# Patient Record
Sex: Female | Born: 1935 | Race: White | Hispanic: No | Marital: Married | Smoking: Never smoker
Health system: Southern US, Community
[De-identification: ages and names within clinical notes are randomized; demographics above are authoritative.]

## PROBLEM LIST (undated history)

## (undated) DIAGNOSIS — J189 Pneumonia, unspecified organism: Secondary | ICD-10-CM

## (undated) DIAGNOSIS — I639 Cerebral infarction, unspecified: Secondary | ICD-10-CM

## (undated) DIAGNOSIS — I4891 Unspecified atrial fibrillation: Secondary | ICD-10-CM

## (undated) DIAGNOSIS — R4701 Aphasia: Secondary | ICD-10-CM

## (undated) DIAGNOSIS — M1990 Unspecified osteoarthritis, unspecified site: Secondary | ICD-10-CM

## (undated) DIAGNOSIS — I5040 Unspecified combined systolic (congestive) and diastolic (congestive) heart failure: Secondary | ICD-10-CM

## (undated) DIAGNOSIS — I10 Essential (primary) hypertension: Secondary | ICD-10-CM

## (undated) DIAGNOSIS — J90 Pleural effusion, not elsewhere classified: Secondary | ICD-10-CM

## (undated) DIAGNOSIS — S72009A Fracture of unspecified part of neck of unspecified femur, initial encounter for closed fracture: Secondary | ICD-10-CM

## (undated) HISTORY — PX: APPENDECTOMY: SHX54

## (undated) HISTORY — PX: BREAST SURGERY: SHX581

---

## 2008-08-28 ENCOUNTER — Encounter: Admission: RE | Admit: 2008-08-28 | Discharge: 2008-08-28 | Payer: Self-pay | Admitting: Internal Medicine

## 2013-04-01 ENCOUNTER — Encounter (HOSPITAL_COMMUNITY): Payer: Self-pay | Admitting: Emergency Medicine

## 2013-04-01 ENCOUNTER — Emergency Department (HOSPITAL_COMMUNITY)
Admission: EM | Admit: 2013-04-01 | Discharge: 2013-04-01 | Disposition: A | Discharge status: Home / Self Care | Payer: Medicare Other | Attending: Emergency Medicine | Admitting: Emergency Medicine

## 2013-04-01 DIAGNOSIS — R040 Epistaxis: Secondary | ICD-10-CM | POA: Insufficient documentation

## 2013-04-01 DIAGNOSIS — Z79899 Other long term (current) drug therapy: Secondary | ICD-10-CM | POA: Insufficient documentation

## 2013-04-01 DIAGNOSIS — Z7982 Long term (current) use of aspirin: Secondary | ICD-10-CM | POA: Insufficient documentation

## 2013-04-01 DIAGNOSIS — I10 Essential (primary) hypertension: Secondary | ICD-10-CM | POA: Insufficient documentation

## 2013-04-01 HISTORY — DX: Essential (primary) hypertension: I10

## 2013-04-01 LAB — CBC WITH DIFFERENTIAL/PLATELET
Basophils Absolute: 0 K/uL (ref 0.0–0.1)
Basophils Relative: 0 % (ref 0–1)
Eosinophils Absolute: 0.1 K/uL (ref 0.0–0.7)
Eosinophils Relative: 1 % (ref 0–5)
HCT: 42.1 % (ref 36.0–46.0)
Hemoglobin: 14.4 g/dL (ref 12.0–15.0)
Lymphocytes Relative: 10 % — ABNORMAL LOW (ref 12–46)
Lymphs Abs: 1.1 K/uL (ref 0.7–4.0)
MCH: 28.9 pg (ref 26.0–34.0)
MCHC: 34.2 g/dL (ref 30.0–36.0)
MCV: 84.4 fL (ref 78.0–100.0)
Monocytes Absolute: 0.8 K/uL (ref 0.1–1.0)
Monocytes Relative: 7 % (ref 3–12)
Neutro Abs: 9.5 K/uL — ABNORMAL HIGH (ref 1.7–7.7)
Neutrophils Relative %: 82 % — ABNORMAL HIGH (ref 43–77)
Platelets: 190 K/uL (ref 150–400)
RBC: 4.99 MIL/uL (ref 3.87–5.11)
RDW: 14 % (ref 11.5–15.5)
WBC: 11.5 K/uL — ABNORMAL HIGH (ref 4.0–10.5)

## 2013-04-01 LAB — BASIC METABOLIC PANEL
BUN: 39 mg/dL — ABNORMAL HIGH (ref 6–23)
GFR calc non Af Amer: 56 mL/min — ABNORMAL LOW (ref >90)
Glucose, Bld: 132 mg/dL — ABNORMAL HIGH (ref 70–99)
Potassium: 4.4 mEq/L (ref 3.5–5.1)

## 2013-04-01 MED ORDER — SILVER NITRATE-POT NITRATE 75-25 % EX MISC
1.0000 "application " | Freq: Once | CUTANEOUS | Status: DC
  Filled 2013-04-01: qty 1

## 2013-04-01 MED ORDER — OXYMETAZOLINE HCL 0.05 % NA SOLN
1.0000 | Freq: Once | NASAL | Status: DC
  Filled 2013-04-01: qty 15

## 2013-04-01 NOTE — ED Notes (Signed)
EDP applied a rocket to the right nare. Pt holding pressure at this time.

## 2013-04-01 NOTE — ED Notes (Signed)
Per pt, an hour ago around 1450, pt nose started bleeding. Attempted to stop bleeding, was not successful. States theres a nodule in her right nostril and she is bleeding through right nostril. On arrival, ems gave nasal spray. Palpated BP at 220. Hx of HTN. No other bleeding problems. Denies feeling anything in her throat. States she takes aspirin every morning. Bleeding is currently controlled. MD at bedside.

## 2013-04-01 NOTE — ED Notes (Signed)
ENT MD at the bedside

## 2013-04-01 NOTE — Discharge Instructions (Signed)
Apply bacitracin ointment or similar to the RIGHT nostril twice daily Use saline nasal spray to both sides frequently. Recheck with Dr. Lazarus Salines 3-4 weeks or as desired.  [phone] for appointment.  Hold aspirin therapy for 5 days.

## 2013-04-01 NOTE — ED Provider Notes (Addendum)
CSN: [id number]     Arrival date & time 04/01/13  1546 History   First MD Initiated Contact with Patient 04/01/13 1550     Chief Complaint  Patient presents with  . Epistaxis   (Consider location/radiation/quality/duration/timing/severity/associated sxs/prior Treatment) The history is provided by the patient.  Rachel [name] is a 79 y.o. female history of hypertension here presenting with nosebleed. She was at home and about an hour ago had an episode of nosebleed. She states she may have a nodule in her nose but she never saw ENT doctor. Denies any fall or trauma. He only takes baby aspirin every morning but denies taking Plavix or Coumadin. She tried to apply some pressure with no relief. No history of bleeding problems.    Past Medical History  Diagnosis Date  . Hypertension    History reviewed. No pertinent past surgical history. No family history on file. History  Substance Use Topics  . Smoking status: Never Smoker   . Smokeless tobacco: Not on file  . Alcohol Use: No   OB History   Grav Para Term Preterm Abortions TAB SAB Ect Mult Living                 Review of Systems  HENT: Positive for nosebleeds.   All other systems reviewed and are negative.    Allergies  Review of patient's allergies indicates no known allergies.  Home Medications   Current Outpatient Rx  Name  Route  Sig  Dispense  Refill  . aspirin 81 MG tablet   Oral   Take 81 mg by mouth daily.         Marland Kitchen losartan (COZAAR) 100 MG tablet   Oral   Take 100 mg by mouth daily.         . metoprolol tartrate (LOPRESSOR) 25 MG tablet   Oral   Take 25 mg by mouth 2 (two) times daily.          BP 181/82  Pulse 120  Temp(Src) 97.5 F (36.4 C) (Oral)  Resp 20  SpO2 94% Physical Exam  Nursing note and vitals reviewed. Constitutional: She is oriented to person, place, and time.  Nose bleeding, uncomfortable   HENT:  Head: Normocephalic.  Mouth/Throat: Oropharynx is clear and moist.  No  blood in OP. R nose with large clot and bleeding   Eyes: Conjunctivae are normal. Pupils are equal, round, and reactive to light.  Neck: Normal range of motion. Neck supple.  Cardiovascular: Normal rate, regular rhythm and normal heart sounds.   Pulmonary/Chest: Effort normal.  Abdominal: Soft.  Musculoskeletal: Normal range of motion.  Neurological: She is alert and oriented to person, place, and time.  Skin: Skin is warm and dry.  Psychiatric: She has a normal mood and affect. Her behavior is normal. Judgment and thought content normal.    ED Course  EPISTAXIS MANAGEMENT Date/Time: 04/01/2013 10:46 PM Performed by: Richardean Canal Authorized by: Richardean Canal Consent: Verbal consent obtained. Risks and benefits: risks, benefits and alternatives were discussed Consent given by: patient Patient understanding: patient states understanding of the procedure being performed Patient consent: the patient's understanding of the procedure matches consent given Procedure consent: procedure consent matches procedure scheduled Relevant documents: relevant documents present and verified Test results: test results available and properly labeled Patient sedated: no Treatment site: right anterior Repair method: suction, silver nitrate and anterior pack Post-procedure assessment: bleeding decreased Treatment complexity: complex Recurrence: recurrence of recent bleed Patient tolerance: Patient tolerated  the procedure well with no immediate complications. Comments: Patient's bleeding stopped then restarted. I called ENT.    (including critical care time) Labs Review Labs Reviewed  CBC WITH DIFFERENTIAL - Abnormal; Notable for the following:    WBC 11.5 (*)    Neutrophils Relative % 82 (*)    Neutro Abs 9.5 (*)    Lymphocytes Relative 10 (*)    All other components within normal limits  BASIC METABOLIC PANEL - Abnormal; Notable for the following:    Glucose, Bld 132 (*)    BUN 39 (*)    GFR  calc non Af Amer 56 (*)    GFR calc Af Amer 65 (*)    All other components within normal limits   Imaging Review No results found.  EKG Interpretation   None       MDM  No diagnosis found. Rachel [name] is a 79 y.o. female here with nose bleed. Not on anticoagulants so no labs necessary.   4:30PM I tried to use silver nitrate without much relief. I tried placing rhinorocket but it was still bleeding. I called Dr. Lazarus Salines, who will see patient in the ED.   7:14 PM Dr. Lazarus Salines saw patient. Able to cauterize the bleeding vessels. Labs showed no anemia. Will have her hold ASA for 5 days. Stable for d/c.   Richardean Canal, MD 04/01/13 1914  Richardean Canal, MD 04/01/13 [phone]

## 2013-04-01 NOTE — Consult Note (Signed)
Rachel, [name] 79 y.o., female [id number]     Chief Complaint:  epistaxis  HPI: 30 yo wf, 1 baby aspirin daily.  Onset unprovoked RIGHT anterior epistaxis this AM.  ER MD could not get bleeding stopped with packing or silver nitrate.  PMH: Past Medical History  Diagnosis Date  . Hypertension     Surg AO:ZHYQMVH reviewed. No pertinent past surgical history.  FHx:  No family history on file. SocHx:  reports that she has never smoked. She does not have any smokeless tobacco history on file. She reports that she does not drink alcohol. Her drug history is not on file.  ALLERGIES: No Known Allergies   (Not in a hospital admission)  Results for orders placed during the hospital encounter of 04/01/13 (from the past 48 hour(s))  CBC WITH DIFFERENTIAL     Status: Abnormal   Collection Time    04/01/13  5:30 PM      Result Value Range   WBC 11.5 (*) 4.0 - 10.5 K/uL   RBC 4.99  3.87 - 5.11 MIL/uL   Hemoglobin 14.4  12.0 - 15.0 g/dL   HCT 84.6  96.2 - 95.2 %   MCV 84.4  78.0 - 100.0 fL   MCH 28.9  26.0 - 34.0 pg   MCHC 34.2  30.0 - 36.0 g/dL   RDW 84.1  32.4 - 40.1 %   Platelets 190  150 - 400 K/uL   Neutrophils Relative % 82 (*) 43 - 77 %   Neutro Abs 9.5 (*) 1.7 - 7.7 K/uL   Lymphocytes Relative 10 (*) 12 - 46 %   Lymphs Abs 1.1  0.7 - 4.0 K/uL   Monocytes Relative 7  3 - 12 %   Monocytes Absolute 0.8  0.1 - 1.0 K/uL   Eosinophils Relative 1  0 - 5 %   Eosinophils Absolute 0.1  0.0 - 0.7 K/uL   Basophils Relative 0  0 - 1 %   Basophils Absolute 0.0  0.0 - 0.1 K/uL  BASIC METABOLIC PANEL     Status: Abnormal   Collection Time    04/01/13  5:30 PM      Result Value Range   Sodium 135  135 - 145 mEq/L   Potassium 4.4  3.5 - 5.1 mEq/L   Chloride 100  96 - 112 mEq/L   CO2 25  19 - 32 mEq/L   Glucose, Bld 132 (*) 70 - 99 mg/dL   BUN 39 (*) 6 - 23 mg/dL   Creatinine, Ser 0.27  0.50 - 1.10 mg/dL   Calcium 25.3  8.4 - 66.4 mg/dL   GFR calc non Af Amer 56 (*) >90 mL/min   GFR calc Af Amer 65 (*) >90 mL/min   Comment: (NOTE)     The eGFR has been calculated using the CKD EPI equation.     This calculation has not been validated in all clinical situations.     eGFR's persistently <90 mL/min signify possible Chronic Kidney     Disease.   No results found.   Blood pressure 181/82, pulse 120, temperature 97.5 F (36.4 C), temperature source Oral, resp. rate 20, SpO2 94.00%.  PHYSICAL EXAM: Overall appearance:  Old, mentally sharp.  Balloon pack RIGHT nose Head:  NCAT Ears:  clear Nose:  Pack RIGHT.  Clear LEFT Oral Cavity:  Blood soiling.  No active bleeding or clots in pharynx Oral Pharynx/Hypopharynx/Larynx:  See above Neuro:  Grossly intact. Neck:  no  Assessment/Plan RIGHT anterior epistaxis with contributions from ASA therapy and HTN.  With informed consent, using Afrin/Xylocaine mixture for topical anesthesia, an active bleeding site on the RIGHT anterior septum was identified and controlled with AgNO3 stick.  Hemostasis achieved.  Pt tolerated this well.  Nasal hygiene measures.  Recheck my office prn.  Flo Shanks 04/01/2013, 7:04 PM

## 2013-04-01 NOTE — ED Notes (Signed)
Dr. Silverio Lay at bedside with ENT cart. Removed medium sized clot from right nostril.

## 2016-02-11 ENCOUNTER — Encounter (HOSPITAL_COMMUNITY): Payer: Self-pay | Admitting: Emergency Medicine

## 2016-02-11 ENCOUNTER — Emergency Department (HOSPITAL_COMMUNITY): Payer: Medicare Other

## 2016-02-11 ENCOUNTER — Inpatient Hospital Stay (HOSPITAL_COMMUNITY)
Admission: EM | Admit: 2016-02-11 | Discharge: 2016-02-15 | DRG: 470 | Disposition: A | Discharge status: Skilled Nursing Facility | Payer: Medicare Other | Attending: Internal Medicine | Admitting: Internal Medicine

## 2016-02-11 DIAGNOSIS — I10 Essential (primary) hypertension: Secondary | ICD-10-CM | POA: Diagnosis present

## 2016-02-11 DIAGNOSIS — K5900 Constipation, unspecified: Secondary | ICD-10-CM | POA: Diagnosis not present

## 2016-02-11 DIAGNOSIS — M810 Age-related osteoporosis without current pathological fracture: Secondary | ICD-10-CM | POA: Diagnosis present

## 2016-02-11 DIAGNOSIS — R748 Abnormal levels of other serum enzymes: Secondary | ICD-10-CM | POA: Diagnosis present

## 2016-02-11 DIAGNOSIS — S72002A Fracture of unspecified part of neck of left femur, initial encounter for closed fracture: Principal | ICD-10-CM | POA: Diagnosis present

## 2016-02-11 DIAGNOSIS — W19XXXA Unspecified fall, initial encounter: Secondary | ICD-10-CM

## 2016-02-11 DIAGNOSIS — E860 Dehydration: Secondary | ICD-10-CM | POA: Diagnosis present

## 2016-02-11 DIAGNOSIS — S50311A Abrasion of right elbow, initial encounter: Secondary | ICD-10-CM | POA: Diagnosis present

## 2016-02-11 DIAGNOSIS — Z9181 History of falling: Secondary | ICD-10-CM | POA: Diagnosis not present

## 2016-02-11 DIAGNOSIS — Z66 Do not resuscitate: Secondary | ICD-10-CM | POA: Diagnosis present

## 2016-02-11 DIAGNOSIS — T07XXXA Unspecified multiple injuries, initial encounter: Secondary | ICD-10-CM

## 2016-02-11 DIAGNOSIS — W19XXXD Unspecified fall, subsequent encounter: Secondary | ICD-10-CM | POA: Diagnosis not present

## 2016-02-11 DIAGNOSIS — Y92008 Other place in unspecified non-institutional (private) residence as the place of occurrence of the external cause: Secondary | ICD-10-CM | POA: Diagnosis not present

## 2016-02-11 DIAGNOSIS — Z79899 Other long term (current) drug therapy: Secondary | ICD-10-CM

## 2016-02-11 DIAGNOSIS — S72009A Fracture of unspecified part of neck of unspecified femur, initial encounter for closed fracture: Secondary | ICD-10-CM

## 2016-02-11 DIAGNOSIS — W010XXA Fall on same level from slipping, tripping and stumbling without subsequent striking against object, initial encounter: Secondary | ICD-10-CM | POA: Diagnosis present

## 2016-02-11 DIAGNOSIS — M25552 Pain in left hip: Secondary | ICD-10-CM | POA: Diagnosis present

## 2016-02-11 HISTORY — DX: Unspecified osteoarthritis, unspecified site: M19.90

## 2016-02-11 HISTORY — DX: Fracture of unspecified part of neck of unspecified femur, initial encounter for closed fracture: S72.009A

## 2016-02-11 LAB — BASIC METABOLIC PANEL
ANION GAP: 11 (ref 5–15)
BUN: 32 mg/dL — ABNORMAL HIGH (ref 6–20)
CHLORIDE: 107 mmol/L (ref 101–111)
CO2: 24 mmol/L (ref 22–32)
CREATININE: 0.65 mg/dL (ref 0.44–1.00)
Calcium: 10.4 mg/dL — ABNORMAL HIGH (ref 8.9–10.3)
Glucose, Bld: 120 mg/dL — ABNORMAL HIGH (ref 65–99)
POTASSIUM: 3.6 mmol/L (ref 3.5–5.1)
Sodium: 142 mmol/L (ref 135–145)

## 2016-02-11 LAB — TYPE AND SCREEN
ABO/RH(D): O POS
Antibody Screen: NEGATIVE

## 2016-02-11 LAB — CBC
HEMATOCRIT: 41.7 % (ref 36.0–46.0)
HEMOGLOBIN: 13.3 g/dL (ref 12.0–15.0)
MCH: 28 pg (ref 26.0–34.0)
MCHC: 31.9 g/dL (ref 30.0–36.0)
MCV: 87.8 fL (ref 78.0–100.0)
Platelets: 229 10*3/uL (ref 150–400)
RBC: 4.75 MIL/uL (ref 3.87–5.11)
RDW: 14.1 % (ref 11.5–15.5)
WBC: 10.4 10*3/uL (ref 4.0–10.5)

## 2016-02-11 LAB — CALCIUM: Calcium: 10 mg/dL (ref 8.9–10.3)

## 2016-02-11 LAB — ABO/RH: ABO/RH(D): O POS

## 2016-02-11 LAB — ALBUMIN: ALBUMIN: 4.1 g/dL (ref 3.5–5.0)

## 2016-02-11 LAB — CK: CK TOTAL: 923 U/L — AB (ref 38–234)

## 2016-02-11 LAB — CBG MONITORING, ED: Glucose-Capillary: 114 mg/dL — ABNORMAL HIGH (ref 65–99)

## 2016-02-11 MED ORDER — METOPROLOL TARTRATE 25 MG PO TABS
25.0000 mg | ORAL_TABLET | Freq: Two times a day (BID) | ORAL | Status: DC
  Administered 2016-02-11 – 2016-02-15 (×8): 25 mg via ORAL
  Filled 2016-02-11: qty 1
  Filled 2016-02-11: qty 2
  Filled 2016-02-11 (×6): qty 1

## 2016-02-11 MED ORDER — SODIUM CHLORIDE 0.9 % IV SOLN
INTRAVENOUS | Status: DC
  Administered 2016-02-11 – 2016-02-12 (×2): via INTRAVENOUS

## 2016-02-11 MED ORDER — CHLORHEXIDINE GLUCONATE CLOTH 2 % EX PADS
6.0000 | MEDICATED_PAD | Freq: Once | CUTANEOUS | Status: AC
  Administered 2016-02-11: 6 via TOPICAL

## 2016-02-11 MED ORDER — HYDROCODONE-ACETAMINOPHEN 5-325 MG PO TABS
1.0000 | ORAL_TABLET | Freq: Four times a day (QID) | ORAL | Status: DC | PRN
  Administered 2016-02-11 – 2016-02-12 (×2): 1 via ORAL
  Administered 2016-02-13 – 2016-02-15 (×4): 2 via ORAL
  Filled 2016-02-11 (×2): qty 2
  Filled 2016-02-11: qty 1
  Filled 2016-02-11: qty 2
  Filled 2016-02-11 (×2): qty 1
  Filled 2016-02-11: qty 2

## 2016-02-11 MED ORDER — MORPHINE SULFATE (PF) 2 MG/ML IV SOLN: 1.0000 mg | INTRAVENOUS | Status: DC | PRN

## 2016-02-11 MED ORDER — DOCUSATE SODIUM 100 MG PO CAPS
100.0000 mg | ORAL_CAPSULE | Freq: Two times a day (BID) | ORAL | Status: DC
  Administered 2016-02-11 – 2016-02-15 (×6): 100 mg via ORAL
  Filled 2016-02-11 (×6): qty 1

## 2016-02-11 MED ORDER — LOSARTAN POTASSIUM 50 MG PO TABS
100.0000 mg | ORAL_TABLET | Freq: Every day | ORAL | Status: DC
Start: 2016-02-11 — End: 2016-02-15
  Administered 2016-02-11 – 2016-02-15 (×4): 100 mg via ORAL
  Filled 2016-02-11 (×4): qty 2

## 2016-02-11 MED ORDER — CHLORHEXIDINE GLUCONATE CLOTH 2 % EX PADS
6.0000 | MEDICATED_PAD | Freq: Once | CUTANEOUS | Status: AC
  Administered 2016-02-12: 6 via TOPICAL

## 2016-02-11 MED ORDER — CHLORHEXIDINE GLUCONATE 4 % EX LIQD
60.0000 mL | Freq: Once | CUTANEOUS | Status: AC
  Administered 2016-02-12: 4 via TOPICAL

## 2016-02-11 MED ORDER — ENOXAPARIN SODIUM 40 MG/0.4ML ~~LOC~~ SOLN
40.0000 mg | SUBCUTANEOUS | Status: DC
  Administered 2016-02-11 – 2016-02-14 (×3): 40 mg via SUBCUTANEOUS
  Filled 2016-02-11 (×3): qty 0.4

## 2016-02-11 NOTE — ED Notes (Signed)
Report called to Dee RN

## 2016-02-11 NOTE — H&P (Signed)
History and Physical    Rachel [name] VWP:[id number] DOB: 1931/03/27 DOA: 02/11/2016   PCP: Katy Apo, MD   Patient coming from/Resides with: Private residence/lives alone  Admission status: Inpatient/orthopedic floor --medically necessary to stay a minimum 2 midnights to rule out impending and/or unexpected changes in physiologic status that may differ from initial evaluation performed in the ER and/or at time of admission. Patient admitted with displaced left femoral neck fracture after fall and likely will require skilled nursing facility placement or rehabilitation when ready for discharge  Chief Complaint: Fall with left femoral neck fracture  HPI: Rachel [name] is a 82 y.o. female with medical history significant for eye protection and osteoarthritis. Patient reports about 2 weeks ago she fell on her back after slipping in her car port. She had some left hip pain and significant bruising but because she was able to walk on the leg did not seek medical attention. Over the past several days she has noticed increasing pain in the left hip and unfortunately fell again this morning. Evaluation in the ER revealed a displaced left femoral neck fracture.  ED Course: Vital Signs: BP 182/76   Pulse 108   Temp 97.9 F (36.6 C) (Oral)   Resp 20   Ht 5\' 1"  (1.549 m)   Wt 50.8 kg (112 lb)   SpO2 100%   BMI 21.16 kg/m  CT head & cervical spine without contrast: No acute abnormality of the head or cervical spine, cortical atrophy with chronic microvascular ischemic change, cervical spondylosis DG right elbow: No acute fracture or joint effusion DG left hip 2-3 views: Displaced left femoral neck fracture DG right foot: No Acute fracture DG right ankle: No fracture or dislocation DG CXR: No acute abnormality -changes of previous ranula, to infection  Review of Systems:  In addition to the HPI above,  No Fever-chills, myalgias or other constitutional symptoms No Headache, changes with  Vision or hearing, new weakness, tingling, numbness in any extremity, No problems swallowing food or Liquids, indigestion/reflux No Chest pain, Cough or Shortness of Breath, palpitations, orthopnea or DOE No Abdominal pain, N/V; no melena or hematochezia, no dark tarry stools No dysuria, hematuria or flank pain No new skin rashes, lesions, masses or bruises No recent weight gain or loss No polyuria, polydypsia or polyphagia,   Past Medical History:  Diagnosis Date  . Hypertension     History reviewed. No pertinent surgical history.  Social History   Social History  . Marital status: Married    Spouse name: N/A  . Number of children: N/A  . Years of education: N/A   Occupational History  . Not on file.   Social History Main Topics  . Smoking status: Never Smoker  . Smokeless tobacco: Never Used  . Alcohol use No  . Drug use: Unknown  . Sexual activity: Not on file   Other Topics Concern  . Not on file   Social History Narrative  . No narrative on file    Mobility: Typically utilizes a cane but occasionally utilizes a rolling walker Work history: Not obtained   Allergies  Allergen Reactions  . Aspirin     nosebleed    Family history reviewed and not pertinent to current admission diagnosis  Prior to Admission medications   Medication Sig Start Date End Date Taking? Authorizing Provider  acetaminophen (TYLENOL) 500 MG tablet Take 500 mg by mouth every 6 (six) hours as needed for moderate pain.   Yes Historical Provider, MD  losartan (COZAAR) 100 MG tablet Take 100 mg by mouth daily.   Yes Historical Provider, MD  metoprolol tartrate (LOPRESSOR) 25 MG tablet Take 25 mg by mouth 2 (two) times daily.   Yes Historical Provider, MD    Physical Exam: Vitals:   02/11/16 1345 02/11/16 1400 02/11/16 1430 02/11/16 1445  BP: 128/80 154/68 167/74 182/76  Pulse: 92 89 94 108  Resp: 18 20 20 20   Temp:      TempSrc:      SpO2: 99% 98% 98% 100%  Weight:        Height:          Constitutional: NAD, calm, comfortable Eyes: PERRL, lids and conjunctivae normal ENMT: Mucous membranes are moist. Posterior pharynx clear of any exudate or lesions.Normal dentition.  Neck: normal, supple, no masses, no thyromegaly Respiratory: clear to auscultation bilaterally, no wheezing, no crackles. Normal respiratory effort. No accessory muscle use.  Cardiovascular: Regular rate and rhythm, no murmurs / rubs / gallops. No extremity edema. 2+ pedal pulses. No carotid bruits.  Abdomen: no tenderness, no masses palpated. No hepatosplenomegaly. Bowel sounds positive.  Musculoskeletal: no clubbing / cyanosis. Left leg is shortened and internally rotated and patient endorses pain with attempts to move or weight-bear. Good ROM, no contractures. Normal muscle tone.  Skin: no rashes, lesions, ulcers. No induration-evidence of resolving ecchymosis on left hip rather extensive (estimated size 20 cm) Neurologic: CN 2-12 grossly intact. Sensation intact, DTR normal. Strength 5/5 x all 4 extremities.  Psychiatric: Normal judgment and insight. Alert and oriented x 3. Normal mood.    Labs on Admission: I have personally reviewed following labs and imaging studies  CBC:  Recent Labs Lab 02/11/16 1235  WBC 10.4  HGB 13.3  HCT 41.7  MCV 87.8  PLT 229   Basic Metabolic Panel:  Recent Labs Lab 02/11/16 1235  NA 142  K 3.6  CL 107  CO2 24  GLUCOSE 120*  BUN 32*  CREATININE 0.65  CALCIUM 10.4*   GFR: Estimated Creatinine Clearance: 40.2 mL/min (by C-G formula based on SCr of 0.65 mg/dL). Liver Function Tests: No results for input(s): AST, ALT, ALKPHOS, BILITOT, PROT, ALBUMIN in the last 168 hours. No results for input(s): LIPASE, AMYLASE in the last 168 hours. No results for input(s): AMMONIA in the last 168 hours. Coagulation Profile: No results for input(s): INR, PROTIME in the last 168 hours. Cardiac Enzymes:  Recent Labs Lab 02/11/16 1235  CKTOTAL 923*    BNP (last 3 results) No results for input(s): PROBNP in the last 8760 hours. HbA1C: No results for input(s): HGBA1C in the last 72 hours. CBG:  Recent Labs Lab 02/11/16 1132  GLUCAP 114*   Lipid Profile: No results for input(s): CHOL, HDL, LDLCALC, TRIG, CHOLHDL, LDLDIRECT in the last 72 hours. Thyroid Function Tests: No results for input(s): TSH, T4TOTAL, FREET4, T3FREE, THYROIDAB in the last 72 hours. Anemia Panel: No results for input(s): VITAMINB12, FOLATE, FERRITIN, TIBC, IRON, RETICCTPCT in the last 72 hours. Urine analysis: No results found for: COLORURINE, APPEARANCEUR, LABSPEC, PHURINE, GLUCOSEU, HGBUR, BILIRUBINUR, KETONESUR, PROTEINUR, UROBILINOGEN, NITRITE, LEUKOCYTESUR Sepsis Labs: @LABRCNTIP (procalcitonin:4,lacticidven:4) )No results found for this or any previous visit (from the past 240 hour(s)).   Radiological Exams on Admission: Dg Chest 1 View  Result Date: 02/11/2016 CLINICAL DATA:  Multiple falls. EXAM: CHEST 1 VIEW COMPARISON:  03/22/2010. FINDINGS: Normal sized heart. Clear lungs. Multiple calcified mediastinal and left hilar lymph nodes. Calcified granuloma in the medial aspect of the left lower lobe. Otherwise,  clear lungs. Diffuse osteopenia. IMPRESSION: No acute abnormality.  Changes of previous granulomatous infection. Electronically Signed   By: Beckie SaltsSteven  Reid M.D.   On: 02/11/2016 13:42   Dg Elbow Complete Right  Result Date: 02/11/2016 CLINICAL DATA:  Larey SeatFell.  Right elbow pain. EXAM: RIGHT ELBOW - COMPLETE 3+ VIEW COMPARISON:  None. FINDINGS: Moderate degenerative changes and chondrocalcinosis. No acute fracture or joint effusion. IMPRESSION: No acute fracture or joint effusion. Degenerative changes and chondrocalcinosis. Electronically Signed   By: Rudie MeyerP.  Gallerani M.D.   On: 02/11/2016 13:39   Dg Ankle Complete Right  Result Date: 02/11/2016 CLINICAL DATA:  Right ankle pain and swelling following multiple falls. EXAM: RIGHT ANKLE - COMPLETE 3+ VIEW  COMPARISON:  None. FINDINGS: Tendon, ligament and fascia calcifications. No fracture, dislocation or effusion seen. Calcaneal spurs. IMPRESSION: 1. No fracture or dislocation. 2. Tendon, ligament and fascia calcifications. Electronically Signed   By: Beckie SaltsSteven  Reid M.D.   On: 02/11/2016 13:44   Ct Head Wo Contrast  Result Date: 02/11/2016 CLINICAL DATA:  Status post trip and fall with a blow to the back of the head while walking last night. Initial encounter. EXAM: CT HEAD WITHOUT CONTRAST CT CERVICAL SPINE WITHOUT CONTRAST TECHNIQUE: Multidetector CT imaging of the head and cervical spine was performed following the standard protocol without intravenous contrast. Multiplanar CT image reconstructions of the cervical spine were also generated. COMPARISON:  None. FINDINGS: CT HEAD FINDINGS Brain: The brain is atrophic with chronic microvascular ischemic change. No acute abnormality including hemorrhage, infarct, mass lesion, mass effect, midline shift or abnormal extra-axial fluid collection. No hydrocephalus or pneumocephalus. Vascular: Unremarkable. Skull: Intact. Sinuses/Orbits: The patient is status post right maxillary antrostomy. The right maxillary sinus appears atrophic with a thickened wall. Otherwise unremarkable. Other: None. CT CERVICAL SPINE FINDINGS Alignment: Maintained. Skull base and vertebrae: No fracture is identified. Degenerative change at the articulations of the lateral masses of C1 and occipital condyles is seen, more notable on the right. Soft tissues and spinal canal: Unremarkable. Disc levels: Loss of disc space height is noted at C6-7. The C4-5 disc is largely calcified. Marked multilevel facet arthropathy is seen. The C4-5 and C5-6 facet joints are ankylosed bilaterally. Upper chest: Lung apices are clear. Other: None. IMPRESSION: No acute abnormality head or cervical spine. Cortical atrophy and chronic microvascular ischemic change. Cervical spondylosis. Electronically Signed   By:  Drusilla Kannerhomas  Dalessio M.D.   On: 02/11/2016 13:02   Ct Cervical Spine Wo Contrast  Result Date: 02/11/2016 CLINICAL DATA:  Status post trip and fall with a blow to the back of the head while walking last night. Initial encounter. EXAM: CT HEAD WITHOUT CONTRAST CT CERVICAL SPINE WITHOUT CONTRAST TECHNIQUE: Multidetector CT imaging of the head and cervical spine was performed following the standard protocol without intravenous contrast. Multiplanar CT image reconstructions of the cervical spine were also generated. COMPARISON:  None. FINDINGS: CT HEAD FINDINGS Brain: The brain is atrophic with chronic microvascular ischemic change. No acute abnormality including hemorrhage, infarct, mass lesion, mass effect, midline shift or abnormal extra-axial fluid collection. No hydrocephalus or pneumocephalus. Vascular: Unremarkable. Skull: Intact. Sinuses/Orbits: The patient is status post right maxillary antrostomy. The right maxillary sinus appears atrophic with a thickened wall. Otherwise unremarkable. Other: None. CT CERVICAL SPINE FINDINGS Alignment: Maintained. Skull base and vertebrae: No fracture is identified. Degenerative change at the articulations of the lateral masses of C1 and occipital condyles is seen, more notable on the right. Soft tissues and spinal canal: Unremarkable. Disc levels: Loss of  disc space height is noted at C6-7. The C4-5 disc is largely calcified. Marked multilevel facet arthropathy is seen. The C4-5 and C5-6 facet joints are ankylosed bilaterally. Upper chest: Lung apices are clear. Other: None. IMPRESSION: No acute abnormality head or cervical spine. Cortical atrophy and chronic microvascular ischemic change. Cervical spondylosis. Electronically Signed   By: Drusilla Kanner M.D.   On: 02/11/2016 13:02   Dg Foot Complete Right  Result Date: 02/11/2016 CLINICAL DATA:  Larey Seat.  Right foot pain. EXAM: RIGHT FOOT COMPLETE - 3+ VIEW COMPARISON:  None. FINDINGS: Moderate osteoporosis. Moderate  degenerative changes. No acute fractures identified. Small calcaneal heel spur and calcifications in the plantar fascia. IMPRESSION: No acute fracture. Electronically Signed   By: Rudie Meyer M.D.   On: 02/11/2016 13:40   Dg Hip Unilat With Pelvis 2-3 Views Left  Result Date: 02/11/2016 CLINICAL DATA:  Larey Seat. EXAM: DG HIP (WITH OR WITHOUT PELVIS) 2-3V LEFT COMPARISON:  None. FINDINGS: There is a displaced left femoral neck fracture. The right hip is intact. The pubic symphysis and SI joints are intact. No pelvic fractures or bone lesions. Vascular calcifications are noted. IMPRESSION: Displaced left femoral neck fracture. Electronically Signed   By: Rudie Meyer M.D.   On: 02/11/2016 13:41    EKG: (Independently reviewed) sinus arrhythmia with ventricular rate 83 bpm, QTC 439 ms, solitary PAC, no ischemic changes  Assessment/Plan Principal Problem:   Displaced fracture of left femoral neck -Presents after second fall in past 2 weeks with radiographic evidence of displaced fracture -Await orthopedic surgery/Dr. Lajoyce Corners evaluation regarding timing of surgical procedure -NPO -IV morphine for pain -Routine hip fracture order set initiated -Osteoporosis lab screening obtained -Lives alone so anticipate will need rehabilitative therapies prior to returning to home environment  Active Problems:   Dehydration, moderate/Elevated CPK -CK 923 with calcium 10.4 and BUN 32 on indicative of volume depletion -Normal saline IV fluid at 75 mL per hour -Labs in a.m. including CK    HTN (hypertension) -Blood pressure somewhat uncontrolled and likely related to ongoing pain -Continue preadmission Lopressor and Cozaar -Despite dehydration renal function stable with BUN elevated at 32 but creatinine normal at 0.65      DVT prophylaxis: Lovenox  Code Status: DO NOT RESUSCITATE Family Communication: No family at bedside Disposition Plan: Anticipate will require short-term rehabilitative therapy prior to  returning home Consults called: Orthopedic surgery/Duda    Russella Dar ANP-BC [facility] Pager [phone]   If 7PM-7AM, please contact night-coverage [email or web address] Password [email or web address]  02/11/2016, 3:26 PM

## 2016-02-11 NOTE — ED Notes (Signed)
Admit provider at bedside 

## 2016-02-11 NOTE — Consult Note (Signed)
ORTHOPAEDIC CONSULTATION  REQUESTING PHYSICIAN: No att. providers found  Chief Complaint: Left hip pain  HPI: Rachel [name] is a 82 y.o. female who presents with a displaced left femoral neck fracture. Patient reports falling about 3 weeks ago sustaining the femoral neck fracture she presents at this time with pain and difficulty weightbearing.  Past Medical History:  Diagnosis Date  . Hypertension    History reviewed. No pertinent surgical history. Social History   Social History  . Marital status: Married    Spouse name: N/A  . Number of children: N/A  . Years of education: N/A   Social History Main Topics  . Smoking status: Never Smoker  . Smokeless tobacco: Never Used  . Alcohol use No  . Drug use: Unknown  . Sexual activity: Not Asked   Other Topics Concern  . None   Social History Narrative  . None   No family history on file. - negative except otherwise stated in the family history section Allergies  Allergen Reactions  . Aspirin     nosebleed   Prior to Admission medications   Medication Sig Start Date End Date Taking? Authorizing Provider  acetaminophen (TYLENOL) 500 MG tablet Take 500 mg by mouth every 6 (six) hours as needed for moderate pain.   Yes Historical Provider, MD  losartan (COZAAR) 100 MG tablet Take 100 mg by mouth daily.   Yes Historical Provider, MD  metoprolol tartrate (LOPRESSOR) 25 MG tablet Take 25 mg by mouth 2 (two) times daily.   Yes Historical Provider, MD   Dg Chest 1 View  Result Date: 02/11/2016 CLINICAL DATA:  Multiple falls. EXAM: CHEST 1 VIEW COMPARISON:  03/22/2010. FINDINGS: Normal sized heart. Clear lungs. Multiple calcified mediastinal and left hilar lymph nodes. Calcified granuloma in the medial aspect of the left lower lobe. Otherwise, clear lungs. Diffuse osteopenia. IMPRESSION: No acute abnormality.  Changes of previous granulomatous infection. Electronically Signed   By: Beckie Salts M.D.   On: 02/11/2016 13:42     Dg Elbow Complete Right  Result Date: 02/11/2016 CLINICAL DATA:  Larey Seat.  Right elbow pain. EXAM: RIGHT ELBOW - COMPLETE 3+ VIEW COMPARISON:  None. FINDINGS: Moderate degenerative changes and chondrocalcinosis. No acute fracture or joint effusion. IMPRESSION: No acute fracture or joint effusion. Degenerative changes and chondrocalcinosis. Electronically Signed   By: Rudie Meyer M.D.   On: 02/11/2016 13:39   Dg Ankle Complete Right  Result Date: 02/11/2016 CLINICAL DATA:  Right ankle pain and swelling following multiple falls. EXAM: RIGHT ANKLE - COMPLETE 3+ VIEW COMPARISON:  None. FINDINGS: Tendon, ligament and fascia calcifications. No fracture, dislocation or effusion seen. Calcaneal spurs. IMPRESSION: 1. No fracture or dislocation. 2. Tendon, ligament and fascia calcifications. Electronically Signed   By: Beckie Salts M.D.   On: 02/11/2016 13:44   Ct Head Wo Contrast  Result Date: 02/11/2016 CLINICAL DATA:  Status post trip and fall with a blow to the back of the head while walking last night. Initial encounter. EXAM: CT HEAD WITHOUT CONTRAST CT CERVICAL SPINE WITHOUT CONTRAST TECHNIQUE: Multidetector CT imaging of the head and cervical spine was performed following the standard protocol without intravenous contrast. Multiplanar CT image reconstructions of the cervical spine were also generated. COMPARISON:  None. FINDINGS: CT HEAD FINDINGS Brain: The brain is atrophic with chronic microvascular ischemic change. No acute abnormality including hemorrhage, infarct, mass lesion, mass effect, midline shift or abnormal extra-axial fluid collection. No hydrocephalus or pneumocephalus. Vascular: Unremarkable. Skull: Intact. Sinuses/Orbits: The patient  is status post right maxillary antrostomy. The right maxillary sinus appears atrophic with a thickened wall. Otherwise unremarkable. Other: None. CT CERVICAL SPINE FINDINGS Alignment: Maintained. Skull base and vertebrae: No fracture is identified.  Degenerative change at the articulations of the lateral masses of C1 and occipital condyles is seen, more notable on the right. Soft tissues and spinal canal: Unremarkable. Disc levels: Loss of disc space height is noted at C6-7. The C4-5 disc is largely calcified. Marked multilevel facet arthropathy is seen. The C4-5 and C5-6 facet joints are ankylosed bilaterally. Upper chest: Lung apices are clear. Other: None. IMPRESSION: No acute abnormality head or cervical spine. Cortical atrophy and chronic microvascular ischemic change. Cervical spondylosis. Electronically Signed   By: Drusilla Kannerhomas  Dalessio M.D.   On: 02/11/2016 13:02   Ct Cervical Spine Wo Contrast  Result Date: 02/11/2016 CLINICAL DATA:  Status post trip and fall with a blow to the back of the head while walking last night. Initial encounter. EXAM: CT HEAD WITHOUT CONTRAST CT CERVICAL SPINE WITHOUT CONTRAST TECHNIQUE: Multidetector CT imaging of the head and cervical spine was performed following the standard protocol without intravenous contrast. Multiplanar CT image reconstructions of the cervical spine were also generated. COMPARISON:  None. FINDINGS: CT HEAD FINDINGS Brain: The brain is atrophic with chronic microvascular ischemic change. No acute abnormality including hemorrhage, infarct, mass lesion, mass effect, midline shift or abnormal extra-axial fluid collection. No hydrocephalus or pneumocephalus. Vascular: Unremarkable. Skull: Intact. Sinuses/Orbits: The patient is status post right maxillary antrostomy. The right maxillary sinus appears atrophic with a thickened wall. Otherwise unremarkable. Other: None. CT CERVICAL SPINE FINDINGS Alignment: Maintained. Skull base and vertebrae: No fracture is identified. Degenerative change at the articulations of the lateral masses of C1 and occipital condyles is seen, more notable on the right. Soft tissues and spinal canal: Unremarkable. Disc levels: Loss of disc space height is noted at C6-7. The C4-5  disc is largely calcified. Marked multilevel facet arthropathy is seen. The C4-5 and C5-6 facet joints are ankylosed bilaterally. Upper chest: Lung apices are clear. Other: None. IMPRESSION: No acute abnormality head or cervical spine. Cortical atrophy and chronic microvascular ischemic change. Cervical spondylosis. Electronically Signed   By: Drusilla Kannerhomas  Dalessio M.D.   On: 02/11/2016 13:02   Dg Foot Complete Right  Result Date: 02/11/2016 CLINICAL DATA:  Larey SeatFell.  Right foot pain. EXAM: RIGHT FOOT COMPLETE - 3+ VIEW COMPARISON:  None. FINDINGS: Moderate osteoporosis. Moderate degenerative changes. No acute fractures identified. Small calcaneal heel spur and calcifications in the plantar fascia. IMPRESSION: No acute fracture. Electronically Signed   By: Rudie MeyerP.  Gallerani M.D.   On: 02/11/2016 13:40   Dg Hip Unilat With Pelvis 2-3 Views Left  Result Date: 02/11/2016 CLINICAL DATA:  Larey SeatFell. EXAM: DG HIP (WITH OR WITHOUT PELVIS) 2-3V LEFT COMPARISON:  None. FINDINGS: There is a displaced left femoral neck fracture. The right hip is intact. The pubic symphysis and SI joints are intact. No pelvic fractures or bone lesions. Vascular calcifications are noted. IMPRESSION: Displaced left femoral neck fracture. Electronically Signed   By: Rudie MeyerP.  Gallerani M.D.   On: 02/11/2016 13:41   - pertinent xrays, CT, MRI studies were reviewed and independently interpreted  Positive ROS: All other systems have been reviewed and were otherwise negative with the exception of those mentioned in the HPI and as above.  Physical Exam: General: Alert, no acute distress Psychiatric: Patient is competent for consent with normal mood and affect Lymphatic: No axillary or cervical lymphadenopathy Cardiovascular: No pedal edema  Respiratory: No cyanosis, no use of accessory musculature GI: No organomegaly, abdomen is soft and non-tender  Skin: Patient has no lacerations or abrasion over the hip.   Neurologic: Patient does  have protective  sensation bilateral lower extremities.   MUSCULOSKELETAL:  On examination patient has pain with internal and external rotation of the left hip. Her left lower extremity shortened and externally rotated. Review the radiographs shows a shortened and displaced femoral neck fracture chronic in nature. Assessment:   Assessment 3 weeks status post left femoral neck fracture. Plan: Plan: We will plan for left hip hemiarthroplasty. Risk and benefits were discussed which include infection neurovascular injury dislocation DVT pulmonary embolus mortality need for additional surgery. Patient states she understands wishes to proceed at this time we'll try to schedule patient for surgery on Friday tomorrow.  Thank you for the consult and the opportunity to see Ms. Rosalio Loud, MD [facility] [phone] 5:14 PM

## 2016-02-11 NOTE — ED Notes (Signed)
Doctor at bedside.

## 2016-02-11 NOTE — ED Triage Notes (Signed)
Arrived via EMS history of multiple fall over the past month. In the middle of the night using walker while ambulating tripped and fall hitting back of head. Denies LOC. Patient has multiple healing stages of ecchymosis left hip and right leg with abrasion right elbow. Bleeding controlled. Alert answering and following commands appropriate.

## 2016-02-11 NOTE — ED Notes (Signed)
Bandaged wound on R elbow and L ankle.

## 2016-02-11 NOTE — ED Provider Notes (Signed)
[facility]-EMERGENCY DEPT Provider Note   CSN: [id number] Arrival date & time: 02/11/16  1125     History   Chief Complaint Chief Complaint  Patient presents with  . Fall  . Leg Pain  . Arm Injury    HPI Rachel [name] is a 82 y.o. female.  82 year old female with past medical history including hypertension who presents with falls. Patient is a poor historian. She reports a fall 3 weeks ago and another fall this morning at her house. She is unable to explain exactly why she fell but she thinks that she was just not paying enough attention. She bruised her left side 3 weeks ago but never sought medical attention. Today she struck the back of her head but denies any loss of consciousness. She also struck her right elbow and right foot in the fall. She denies any chest pain or shortness of breath. No abdominal pain. No new neck or back pain.   The history is provided by the patient.  Fall   Leg Pain    Arm Injury      Past Medical History:  Diagnosis Date  . Hypertension     There are no active problems to display for this patient.   History reviewed. No pertinent surgical history.  OB History    No data available       Home Medications    Prior to Admission medications   Medication Sig Start Date End Date Taking? Authorizing Provider  aspirin 81 MG tablet Take 81 mg by mouth daily.    Historical Provider, MD  losartan (COZAAR) 100 MG tablet Take 100 mg by mouth daily.    Historical Provider, MD  metoprolol tartrate (LOPRESSOR) 25 MG tablet Take 25 mg by mouth 2 (two) times daily.    Historical Provider, MD    Family History No family history on file.  Social History Social History  Substance Use Topics  . Smoking status: Never Smoker  . Smokeless tobacco: Never Used  . Alcohol use No     Allergies   Review of patient's allergies indicates no known allergies.   Review of Systems Review of Systems 10 Systems reviewed and are negative for acute change  except as noted in the HPI.   Physical Exam Updated Vital Signs BP 176/92 (BP Location: Right Arm)   Pulse 92   Temp 97.9 F (36.6 C) (Oral)   Resp 20   Ht 5\' 1"  (1.549 m)   Wt 112 lb (50.8 kg)   SpO2 98%   BMI 21.16 kg/m   Physical Exam  Constitutional: No distress.  Thin, frail elderly woman awake and alert  HENT:  Head: Normocephalic and atraumatic.  Moist mucous membranes  Eyes: Conjunctivae and EOM are normal. Pupils are equal, round, and reactive to light.  Neck:  In c-collar  Cardiovascular: Normal rate, regular rhythm and normal heart sounds.   No murmur heard. Pulmonary/Chest: Effort normal and breath sounds normal. She exhibits no tenderness.  Abdominal: Soft. Bowel sounds are normal. She exhibits no distension. There is no tenderness.  Musculoskeletal: Normal range of motion. She exhibits edema.  Large healing ecchymosis on lateral L upper thigh; ecchymoses b/l elbows w/ normal ROM; ecchymosis and swelling dorsal R foot and lateral ankle without focal tenderness; old ecchymosis R lateral lower leg  Neurological: She is alert.  Fluent speech, occasional hesitation answering questions but no obvious confusion; moving all 4 extremities  Skin: Skin is warm and dry.  Multiple ecchymoses in various  stages of healing, abrasion R elbow  Psychiatric:  Calm, cooperative  Nursing note and vitals reviewed.    ED Treatments / Results  Labs (all labs ordered are listed, but only abnormal results are displayed) Labs Reviewed  CBG MONITORING, ED - Abnormal; Notable for the following:       Result Value   Glucose-Capillary 114 (*)    All other components within normal limits  BASIC METABOLIC PANEL  CBC  URINALYSIS, ROUTINE W REFLEX MICROSCOPIC (NOT AT [facility])  CK  CBG MONITORING, ED    EKG  EKG Interpretation  Date/Time:  Thursday February 11 2016 11:25:47 EDT Ventricular Rate:  83 PR Interval:    QRS Duration: 88 QT Interval:  373 QTC Calculation: 439 R  Axis:   -18 Text Interpretation:  Sinus arrhythmia Left ventricular hypertrophy Anterior infarct, old No previous ECGs available Confirmed by Saprina Chuong MD, Evadean Sproule ([id number]) on 02/11/2016 11:48:19 AM Also confirmed by Taleyah Hillman MD, Rosaleigh Brazzel [phone]), editor Valentina LucksStout CT, Jola BabinskiMarilyn [phone])  on 02/11/2016 11:53:44 AM       Radiology Dg Chest 1 View  Result Date: 02/11/2016 CLINICAL DATA:  Multiple falls. EXAM: CHEST 1 VIEW COMPARISON:  03/22/2010. FINDINGS: Normal sized heart. Clear lungs. Multiple calcified mediastinal and left hilar lymph nodes. Calcified granuloma in the medial aspect of the left lower lobe. Otherwise, clear lungs. Diffuse osteopenia. IMPRESSION: No acute abnormality.  Changes of previous granulomatous infection. Electronically Signed   By: Beckie SaltsSteven  Reid M.D.   On: 02/11/2016 13:42   Dg Elbow Complete Right  Result Date: 02/11/2016 CLINICAL DATA:  Larey SeatFell.  Right elbow pain. EXAM: RIGHT ELBOW - COMPLETE 3+ VIEW COMPARISON:  None. FINDINGS: Moderate degenerative changes and chondrocalcinosis. No acute fracture or joint effusion. IMPRESSION: No acute fracture or joint effusion. Degenerative changes and chondrocalcinosis. Electronically Signed   By: Rudie MeyerP.  Gallerani M.D.   On: 02/11/2016 13:39   Dg Ankle Complete Right  Result Date: 02/11/2016 CLINICAL DATA:  Right ankle pain and swelling following multiple falls. EXAM: RIGHT ANKLE - COMPLETE 3+ VIEW COMPARISON:  None. FINDINGS: Tendon, ligament and fascia calcifications. No fracture, dislocation or effusion seen. Calcaneal spurs. IMPRESSION: 1. No fracture or dislocation. 2. Tendon, ligament and fascia calcifications. Electronically Signed   By: Beckie SaltsSteven  Reid M.D.   On: 02/11/2016 13:44   Ct Head Wo Contrast  Result Date: 02/11/2016 CLINICAL DATA:  Status post trip and fall with a blow to the back of the head while walking last night. Initial encounter. EXAM: CT HEAD WITHOUT CONTRAST CT CERVICAL SPINE WITHOUT CONTRAST TECHNIQUE: Multidetector CT imaging of  the head and cervical spine was performed following the standard protocol without intravenous contrast. Multiplanar CT image reconstructions of the cervical spine were also generated. COMPARISON:  None. FINDINGS: CT HEAD FINDINGS Brain: The brain is atrophic with chronic microvascular ischemic change. No acute abnormality including hemorrhage, infarct, mass lesion, mass effect, midline shift or abnormal extra-axial fluid collection. No hydrocephalus or pneumocephalus. Vascular: Unremarkable. Skull: Intact. Sinuses/Orbits: The patient is status post right maxillary antrostomy. The right maxillary sinus appears atrophic with a thickened wall. Otherwise unremarkable. Other: None. CT CERVICAL SPINE FINDINGS Alignment: Maintained. Skull base and vertebrae: No fracture is identified. Degenerative change at the articulations of the lateral masses of C1 and occipital condyles is seen, more notable on the right. Soft tissues and spinal canal: Unremarkable. Disc levels: Loss of disc space height is noted at C6-7. The C4-5 disc is largely calcified. Marked multilevel facet arthropathy is seen. The C4-5 and C5-6 facet  joints are ankylosed bilaterally. Upper chest: Lung apices are clear. Other: None. IMPRESSION: No acute abnormality head or cervical spine. Cortical atrophy and chronic microvascular ischemic change. Cervical spondylosis. Electronically Signed   By: Drusilla Kanner M.D.   On: 02/11/2016 13:02   Ct Cervical Spine Wo Contrast  Result Date: 02/11/2016 CLINICAL DATA:  Status post trip and fall with a blow to the back of the head while walking last night. Initial encounter. EXAM: CT HEAD WITHOUT CONTRAST CT CERVICAL SPINE WITHOUT CONTRAST TECHNIQUE: Multidetector CT imaging of the head and cervical spine was performed following the standard protocol without intravenous contrast. Multiplanar CT image reconstructions of the cervical spine were also generated. COMPARISON:  None. FINDINGS: CT HEAD FINDINGS Brain: The  brain is atrophic with chronic microvascular ischemic change. No acute abnormality including hemorrhage, infarct, mass lesion, mass effect, midline shift or abnormal extra-axial fluid collection. No hydrocephalus or pneumocephalus. Vascular: Unremarkable. Skull: Intact. Sinuses/Orbits: The patient is status post right maxillary antrostomy. The right maxillary sinus appears atrophic with a thickened wall. Otherwise unremarkable. Other: None. CT CERVICAL SPINE FINDINGS Alignment: Maintained. Skull base and vertebrae: No fracture is identified. Degenerative change at the articulations of the lateral masses of C1 and occipital condyles is seen, more notable on the right. Soft tissues and spinal canal: Unremarkable. Disc levels: Loss of disc space height is noted at C6-7. The C4-5 disc is largely calcified. Marked multilevel facet arthropathy is seen. The C4-5 and C5-6 facet joints are ankylosed bilaterally. Upper chest: Lung apices are clear. Other: None. IMPRESSION: No acute abnormality head or cervical spine. Cortical atrophy and chronic microvascular ischemic change. Cervical spondylosis. Electronically Signed   By: Drusilla Kanner M.D.   On: 02/11/2016 13:02   Dg Foot Complete Right  Result Date: 02/11/2016 CLINICAL DATA:  Larey Seat.  Right foot pain. EXAM: RIGHT FOOT COMPLETE - 3+ VIEW COMPARISON:  None. FINDINGS: Moderate osteoporosis. Moderate degenerative changes. No acute fractures identified. Small calcaneal heel spur and calcifications in the plantar fascia. IMPRESSION: No acute fracture. Electronically Signed   By: Rudie Meyer M.D.   On: 02/11/2016 13:40   Dg Hip Unilat With Pelvis 2-3 Views Left  Result Date: 02/11/2016 CLINICAL DATA:  Larey Seat. EXAM: DG HIP (WITH OR WITHOUT PELVIS) 2-3V LEFT COMPARISON:  None. FINDINGS: There is a displaced left femoral neck fracture. The right hip is intact. The pubic symphysis and SI joints are intact. No pelvic fractures or bone lesions. Vascular calcifications are  noted. IMPRESSION: Displaced left femoral neck fracture. Electronically Signed   By: Rudie Meyer M.D.   On: 02/11/2016 13:41    Procedures Procedures (including critical care time)  Medications Ordered in ED Medications - No data to display   Initial Impression / Assessment and Plan / ED Course  I have reviewed the triage vital signs and the nursing notes.  Pertinent labs & imaging results that were available during my care of the patient were reviewed by me and considered in my medical decision making (see chart for details).  Clinical Course    Patient who lives alone at home presents with multiple falls over the last few weeks, most recent fall with this morning when she struck the back of her head. She was awake and alert, frail appearing but comfortable on exam. She denied any pain. Vital signs stable. She had multiple ecchymoses of various stages, no chest or abdominal tenderness. She was neurologically intact. Obtained labs to evaluate for etiology of falls as well as above imaging to  evaluate for acute injury.  Labwork shows mildly elevated CK at 923 but otherwise stable labs. Imaging shows no acute findings on CT of head and C-spine but left femoral neck fracture. I suspect that this was from her fall 3 weeks ago as the patient reports hip pain after that fall, not after today's fall. I discussed with orthopedics, Dr. Lajoyce Corners, who will evaluate pt for repair. Discussed w/ Revonda Standard, hospitalist, and pt admitted to [facility] for further care.  Final Clinical Impressions(s) / ED Diagnoses   Final diagnoses:  Closed left hip fracture, initial encounter [facility])  Multiple bruises    New Prescriptions New Prescriptions   No medications on file     Laurence Spates, MD 02/11/16 1616

## 2016-02-12 ENCOUNTER — Encounter (HOSPITAL_COMMUNITY)
Admission: EM | Disposition: A | Discharge status: Skilled Nursing Facility | Payer: Self-pay | Source: Home / Self Care | Attending: Internal Medicine

## 2016-02-12 ENCOUNTER — Inpatient Hospital Stay (HOSPITAL_COMMUNITY): Payer: Medicare Other | Admitting: Certified Registered Nurse Anesthetist

## 2016-02-12 ENCOUNTER — Encounter (HOSPITAL_COMMUNITY): Payer: Self-pay | Admitting: Certified Registered Nurse Anesthetist

## 2016-02-12 DIAGNOSIS — E860 Dehydration: Secondary | ICD-10-CM

## 2016-02-12 DIAGNOSIS — S72002A Fracture of unspecified part of neck of left femur, initial encounter for closed fracture: Principal | ICD-10-CM

## 2016-02-12 DIAGNOSIS — R748 Abnormal levels of other serum enzymes: Secondary | ICD-10-CM

## 2016-02-12 DIAGNOSIS — W19XXXA Unspecified fall, initial encounter: Secondary | ICD-10-CM

## 2016-02-12 DIAGNOSIS — I10 Essential (primary) hypertension: Secondary | ICD-10-CM

## 2016-02-12 HISTORY — PX: HIP ARTHROPLASTY: SHX981

## 2016-02-12 LAB — BASIC METABOLIC PANEL
Anion gap: 8 (ref 5–15)
BUN: 31 mg/dL — AB (ref 6–20)
CALCIUM: 9.4 mg/dL (ref 8.9–10.3)
CO2: 27 mmol/L (ref 22–32)
CREATININE: 0.6 mg/dL (ref 0.44–1.00)
Chloride: 108 mmol/L (ref 101–111)
Glucose, Bld: 102 mg/dL — ABNORMAL HIGH (ref 65–99)
Potassium: 3.4 mmol/L — ABNORMAL LOW (ref 3.5–5.1)
SODIUM: 143 mmol/L (ref 135–145)

## 2016-02-12 LAB — CBC
HCT: 38.2 % (ref 36.0–46.0)
HEMOGLOBIN: 11.8 g/dL — AB (ref 12.0–15.0)
MCH: 27.6 pg (ref 26.0–34.0)
MCHC: 30.9 g/dL (ref 30.0–36.0)
MCV: 89.3 fL (ref 78.0–100.0)
PLATELETS: 194 10*3/uL (ref 150–400)
RBC: 4.28 MIL/uL (ref 3.87–5.11)
RDW: 14.5 % (ref 11.5–15.5)
WBC: 8.1 10*3/uL (ref 4.0–10.5)

## 2016-02-12 LAB — SURGICAL PCR SCREEN
MRSA, PCR: NEGATIVE
Staphylococcus aureus: POSITIVE — AB

## 2016-02-12 LAB — CK: CK TOTAL: 673 U/L — AB (ref 38–234)

## 2016-02-12 LAB — APTT: APTT: 39 s — AB (ref 24–36)

## 2016-02-12 LAB — PROTIME-INR
INR: 1.37
Prothrombin Time: 17 seconds — ABNORMAL HIGH (ref 11.4–15.2)

## 2016-02-12 SURGERY — HEMIARTHROPLASTY, HIP, DIRECT ANTERIOR APPROACH, FOR FRACTURE
Anesthesia: General | Laterality: Left

## 2016-02-12 MED ORDER — POVIDONE-IODINE 10 % EX SWAB: 2.0000 "application " | Freq: Once | CUTANEOUS | Status: DC

## 2016-02-12 MED ORDER — FENTANYL CITRATE (PF) 100 MCG/2ML IJ SOLN
INTRAMUSCULAR | Status: AC
  Filled 2016-02-12: qty 4

## 2016-02-12 MED ORDER — PHENYLEPHRINE HCL 10 MG/ML IJ SOLN
INTRAVENOUS | Status: DC | PRN
  Administered 2016-02-12: 50 ug/min via INTRAVENOUS

## 2016-02-12 MED ORDER — METOCLOPRAMIDE HCL 5 MG/ML IJ SOLN: 5.0000 mg | Freq: Three times a day (TID) | INTRAMUSCULAR | Status: DC | PRN

## 2016-02-12 MED ORDER — ACETAMINOPHEN 325 MG PO TABS
650.0000 mg | ORAL_TABLET | Freq: Four times a day (QID) | ORAL | Status: DC | PRN
  Administered 2016-02-14 – 2016-02-15 (×2): 650 mg via ORAL
  Filled 2016-02-12 (×2): qty 2

## 2016-02-12 MED ORDER — EPHEDRINE SULFATE 50 MG/ML IJ SOLN
INTRAMUSCULAR | Status: DC | PRN
  Administered 2016-02-12: 5 mg via INTRAVENOUS

## 2016-02-12 MED ORDER — MORPHINE SULFATE (PF) 2 MG/ML IV SOLN
0.5000 mg | INTRAVENOUS | Status: DC | PRN
Start: 2016-02-12 — End: 2016-02-12

## 2016-02-12 MED ORDER — ROCURONIUM BROMIDE 10 MG/ML (PF) SYRINGE
PREFILLED_SYRINGE | INTRAVENOUS | Status: AC
  Filled 2016-02-12: qty 10

## 2016-02-12 MED ORDER — ACETAMINOPHEN 650 MG RE SUPP: 650.0000 mg | Freq: Four times a day (QID) | RECTAL | Status: DC | PRN

## 2016-02-12 MED ORDER — WARFARIN - PHARMACIST DOSING INPATIENT
Freq: Every day | Status: DC
  Administered 2016-02-13: 18:00:00

## 2016-02-12 MED ORDER — CEFAZOLIN SODIUM-DEXTROSE 2-4 GM/100ML-% IV SOLN
2.0000 g | Freq: Four times a day (QID) | INTRAVENOUS | Status: AC
  Administered 2016-02-12 – 2016-02-13 (×2): 2 g via INTRAVENOUS
  Filled 2016-02-12 (×2): qty 100

## 2016-02-12 MED ORDER — LIDOCAINE HCL (CARDIAC) 20 MG/ML IV SOLN
INTRAVENOUS | Status: DC | PRN
  Administered 2016-02-12: 40 mg via INTRATRACHEAL

## 2016-02-12 MED ORDER — PHENYLEPHRINE 40 MCG/ML (10ML) SYRINGE FOR IV PUSH (FOR BLOOD PRESSURE SUPPORT)
PREFILLED_SYRINGE | INTRAVENOUS | Status: AC
  Filled 2016-02-12: qty 50

## 2016-02-12 MED ORDER — ONDANSETRON HCL 4 MG/2ML IJ SOLN
INTRAMUSCULAR | Status: DC | PRN
  Administered 2016-02-12: 4 mg via INTRAVENOUS

## 2016-02-12 MED ORDER — LACTATED RINGERS IV SOLN
INTRAVENOUS | Status: DC
  Administered 2016-02-12: 18:00:00 via INTRAVENOUS

## 2016-02-12 MED ORDER — LABETALOL HCL 5 MG/ML IV SOLN
INTRAVENOUS | Status: AC
  Administered 2016-02-12: 5 mg via INTRAVENOUS
  Filled 2016-02-12: qty 4

## 2016-02-12 MED ORDER — SODIUM CHLORIDE 0.9 % IV SOLN
INTRAVENOUS | Status: DC
  Administered 2016-02-12: 23:00:00 via INTRAVENOUS

## 2016-02-12 MED ORDER — ONDANSETRON HCL 4 MG PO TABS: 4.0000 mg | ORAL_TABLET | Freq: Four times a day (QID) | ORAL | Status: DC | PRN

## 2016-02-12 MED ORDER — METOPROLOL TARTRATE 5 MG/5ML IV SOLN
INTRAVENOUS | Status: DC | PRN
  Administered 2016-02-12: 5 mg via INTRAVENOUS

## 2016-02-12 MED ORDER — MENTHOL 3 MG MT LOZG: 1.0000 | LOZENGE | OROMUCOSAL | Status: DC | PRN

## 2016-02-12 MED ORDER — METOCLOPRAMIDE HCL 5 MG PO TABS: 5.0000 mg | ORAL_TABLET | Freq: Three times a day (TID) | ORAL | Status: DC | PRN

## 2016-02-12 MED ORDER — CALCIUM CHLORIDE 10 % IV SOLN
INTRAVENOUS | Status: AC
  Filled 2016-02-12: qty 10

## 2016-02-12 MED ORDER — METOPROLOL TARTRATE 5 MG/5ML IV SOLN
INTRAVENOUS | Status: AC
  Filled 2016-02-12: qty 5

## 2016-02-12 MED ORDER — HYDROCODONE-ACETAMINOPHEN 5-325 MG PO TABS: 1.0000 | ORAL_TABLET | Freq: Four times a day (QID) | ORAL | Status: DC | PRN

## 2016-02-12 MED ORDER — CEFAZOLIN SODIUM-DEXTROSE 2-4 GM/100ML-% IV SOLN
2.0000 g | INTRAVENOUS | Status: AC
  Administered 2016-02-12: 2 g via INTRAVENOUS
  Filled 2016-02-12 (×2): qty 100

## 2016-02-12 MED ORDER — WARFARIN SODIUM 4 MG PO TABS
4.0000 mg | ORAL_TABLET | Freq: Once | ORAL | Status: AC
  Administered 2016-02-12: 4 mg via ORAL
  Filled 2016-02-12: qty 1

## 2016-02-12 MED ORDER — FENTANYL CITRATE (PF) 100 MCG/2ML IJ SOLN: 25.0000 ug | INTRAMUSCULAR | Status: DC | PRN

## 2016-02-12 MED ORDER — SUGAMMADEX SODIUM 200 MG/2ML IV SOLN
INTRAVENOUS | Status: AC
  Filled 2016-02-12: qty 2

## 2016-02-12 MED ORDER — HYDRALAZINE HCL 20 MG/ML IJ SOLN: 5.0000 mg | INTRAMUSCULAR | Status: DC | PRN

## 2016-02-12 MED ORDER — LIDOCAINE 2% (20 MG/ML) 5 ML SYRINGE
INTRAMUSCULAR | Status: AC
  Filled 2016-02-12: qty 20

## 2016-02-12 MED ORDER — ONDANSETRON HCL 4 MG/2ML IJ SOLN
INTRAMUSCULAR | Status: AC
  Filled 2016-02-12: qty 2

## 2016-02-12 MED ORDER — SUGAMMADEX SODIUM 200 MG/2ML IV SOLN
INTRAVENOUS | Status: DC | PRN
  Administered 2016-02-12: 100 mg via INTRAVENOUS

## 2016-02-12 MED ORDER — SUCCINYLCHOLINE CHLORIDE 20 MG/ML IJ SOLN
INTRAMUSCULAR | Status: DC | PRN
  Administered 2016-02-12: 40 mg via INTRAVENOUS

## 2016-02-12 MED ORDER — ONDANSETRON HCL 4 MG/2ML IJ SOLN: 4.0000 mg | Freq: Four times a day (QID) | INTRAMUSCULAR | Status: DC | PRN

## 2016-02-12 MED ORDER — SODIUM CHLORIDE 0.9 % IR SOLN
Status: DC | PRN
  Administered 2016-02-12: 1000 mL

## 2016-02-12 MED ORDER — ACETAMINOPHEN 500 MG PO TABS: 500.0000 mg | ORAL_TABLET | Freq: Four times a day (QID) | ORAL | 0 refills | Status: AC | PRN

## 2016-02-12 MED ORDER — PHENOL 1.4 % MT LIQD: 1.0000 | OROMUCOSAL | Status: DC | PRN

## 2016-02-12 MED ORDER — LABETALOL HCL 5 MG/ML IV SOLN
5.0000 mg | INTRAVENOUS | Status: DC | PRN
  Administered 2016-02-12: 5 mg via INTRAVENOUS

## 2016-02-12 MED ORDER — ROCURONIUM BROMIDE 100 MG/10ML IV SOLN
INTRAVENOUS | Status: DC | PRN
  Administered 2016-02-12: 20 mg via INTRAVENOUS

## 2016-02-12 MED ORDER — PHENYLEPHRINE 40 MCG/ML (10ML) SYRINGE FOR IV PUSH (FOR BLOOD PRESSURE SUPPORT)
PREFILLED_SYRINGE | INTRAVENOUS | Status: AC
  Filled 2016-02-12: qty 10

## 2016-02-12 MED ORDER — FENTANYL CITRATE (PF) 100 MCG/2ML IJ SOLN
INTRAMUSCULAR | Status: DC | PRN
  Administered 2016-02-12: 50 ug via INTRAVENOUS
  Administered 2016-02-12: 100 ug via INTRAVENOUS

## 2016-02-12 MED ORDER — SUCCINYLCHOLINE CHLORIDE 200 MG/10ML IV SOSY
PREFILLED_SYRINGE | INTRAVENOUS | Status: AC
  Filled 2016-02-12: qty 10

## 2016-02-12 MED ORDER — PROPOFOL 10 MG/ML IV BOLUS
INTRAVENOUS | Status: DC | PRN
  Administered 2016-02-12: 100 mg via INTRAVENOUS

## 2016-02-12 SURGICAL SUPPLY — 45 items
BLADE SAW SAG 73X25 THK (BLADE) ×4
BLADE SAW SGTL 73X25 THK (BLADE) ×2 IMPLANT
BRUSH FEMORAL CANAL (MISCELLANEOUS) IMPLANT
CAPT HIP HEMI 1 ×3 IMPLANT
COVER SURGICAL LIGHT HANDLE (MISCELLANEOUS) ×3 IMPLANT
DRAPE IMP U-DRAPE 54X76 (DRAPES) ×3 IMPLANT
DRAPE INCISE IOBAN 85X60 (DRAPES) ×6 IMPLANT
DRAPE ORTHO SPLIT 77X108 STRL (DRAPES) ×4
DRAPE SURG ORHT 6 SPLT 77X108 (DRAPES) ×2 IMPLANT
DRAPE U-SHAPE 47X51 STRL (DRAPES) ×3 IMPLANT
DRSG AQUACEL AG ADV 3.5X10 (GAUZE/BANDAGES/DRESSINGS) ×3 IMPLANT
DRSG MEPILEX BORDER 4X8 (GAUZE/BANDAGES/DRESSINGS) ×6 IMPLANT
DURAPREP 26ML APPLICATOR (WOUND CARE) ×3 IMPLANT
ELECT BLADE 6.5 EXT (BLADE) IMPLANT
ELECT CAUTERY BLADE 6.4 (BLADE) IMPLANT
ELECT REM PT RETURN 9FT ADLT (ELECTROSURGICAL) ×3
ELECTRODE REM PT RTRN 9FT ADLT (ELECTROSURGICAL) ×1 IMPLANT
GAUZE SPONGE 4X4 12PLY STRL (GAUZE/BANDAGES/DRESSINGS) ×3 IMPLANT
GLOVE BIOGEL PI IND STRL 9 (GLOVE) ×1 IMPLANT
GLOVE BIOGEL PI INDICATOR 9 (GLOVE) ×2
GLOVE SURG ORTHO 9.0 STRL STRW (GLOVE) ×3 IMPLANT
GOWN STRL REUS W/ TWL XL LVL3 (GOWN DISPOSABLE) ×1 IMPLANT
GOWN STRL REUS W/TWL XL LVL3 (GOWN DISPOSABLE) ×2
HANDPIECE INTERPULSE COAX TIP (DISPOSABLE)
KIT BASIN OR (CUSTOM PROCEDURE TRAY) ×3 IMPLANT
KIT ROOM TURNOVER OR (KITS) ×3 IMPLANT
MANIFOLD NEPTUNE II (INSTRUMENTS) ×3 IMPLANT
NS IRRIG 1000ML POUR BTL (IV SOLUTION) ×3 IMPLANT
PACK TOTAL JOINT (CUSTOM PROCEDURE TRAY) ×3 IMPLANT
PACK UNIVERSAL I (CUSTOM PROCEDURE TRAY) ×3 IMPLANT
PAD ARMBOARD 7.5X6 YLW CONV (MISCELLANEOUS) ×6 IMPLANT
SET HNDPC FAN SPRY TIP SCT (DISPOSABLE) IMPLANT
STAPLER VISISTAT 35W (STAPLE) ×3 IMPLANT
SUT ETHIBOND NAB CT1 #1 30IN (SUTURE) ×12 IMPLANT
SUT VIC AB 0 CT1 27 (SUTURE) ×2
SUT VIC AB 0 CT1 27XBRD ANBCTR (SUTURE) ×1 IMPLANT
SUT VIC AB 1 CT1 27 (SUTURE) ×4
SUT VIC AB 1 CT1 27XBRD ANBCTR (SUTURE) ×2 IMPLANT
SUT VIC AB 2-0 CTB1 (SUTURE) ×9 IMPLANT
TOWEL OR 17X24 6PK STRL BLUE (TOWEL DISPOSABLE) ×3 IMPLANT
TOWEL OR 17X26 10 PK STRL BLUE (TOWEL DISPOSABLE) ×3 IMPLANT
TOWER CARTRIDGE SMART MIX (DISPOSABLE) IMPLANT
TRAY FOLEY CATH 16FRSI W/METER (SET/KITS/TRAYS/PACK) IMPLANT
WATER STERILE IRR 1000ML POUR (IV SOLUTION) ×9 IMPLANT
YANKAUER SUCT BULB TIP NO VENT (SUCTIONS) ×3 IMPLANT

## 2016-02-12 NOTE — Op Note (Signed)
Date of Surgery: 02/12/2016  INDICATIONS: Ms. Degenhart is a 82 y.o.-year-old female who fell 3 weeks ago at home. Patient presented to the emergency room last night. Patient was admitted through the hospitalist service and was felt to be stable for surgical intervention at this time. Risk and benefits were discussed including infection dislocation DVT need for additional surgery mortality risk of hip fractures. Patient states she understands wishes to proceed at this time.Marland Kitchen  PREOPERATIVE DIAGNOSIS: Left hip femoral neck fracture.  POSTOPERATIVE DIAGNOSIS: Same.  PROCEDURE: Hemiarthroplasty femoral neck fracture left   SURGEON: Lajoyce Corners, M.D.  ANESTHESIA:  general  IV FLUIDS AND URINE: See anesthesia.  ESTIMATED BLOOD LOSS: Minimal mL.  COMPLICATIONS: None.  COMPONENT SIZES: [place]w size 3 femur and size 42 mm monopolar head. +0 neck.  DESCRIPTION OF PROCEDURE: The patient was brought to the operating room and underwent a general anesthetic. After adequate levels of anesthesia were obtained patient was placed in the lateral decubitus position with the hip fracture side up and axillary roll was placed the arms were padded and the mark II supports were placed. The lower extremity was prepped using DuraPrep draped into a sterile field Ioban was used to cover all exposed skin. A timeout was called. A posterior lateral incision was made this was carried down through the tensor fascia lata which was split. Retractors were placed. The piriformis short external rotators and capsule was incised off the femoral neck and retracted with Ethibond suture. The femoral neck cut was made 1 cm proximal to the calcar. The head was delivered. The head was sized and the appropriate size had a good suction fit. The femoral canal was sequentially broached to the proper size. The hip was trialed and this had a good stable fit. The wound was irrigated with normal saline. The femoral component and head were inserted this was  again reduced. The hip was stable. The wound was irrigated with normal saline. The capsule short external rotators and piriformis were reapproximated with #1 Ethibond. The tensor fascia lata was closed using #1 Vicryl. Subcutaneous was closed using 0 Vicryl the skin was closed using staples. A Mepilex dressing was applied. Patient was extubated taken to the PACU in stable condition. Aldean Baker, MD [facility] 6:55 PM

## 2016-02-12 NOTE — Transfer of Care (Signed)
Immediate Anesthesia Transfer of Care Note  Patient: Rachel [name]  Procedure(s) Performed: Procedure(s): ARTHROPLASTY BIPOLAR HIP (HEMIARTHROPLASTY) (Left)  Patient Location: PACU  Anesthesia Type:General  Level of Consciousness: sedated  Airway & Oxygen Therapy: Patient Spontanous Breathing and Patient connected to nasal cannula oxygen  Post-op Assessment: Report given to RN and Post -op Vital signs reviewed and stable  Post vital signs: Reviewed and stable  Last Vitals:  Vitals:   02/12/16 1704 02/12/16 1916  BP: (!) 181/80   Pulse: (!) 127 81  Resp:  (!) 23  Temp:  37.3 C    Last Pain:  Vitals:   02/12/16 1703  TempSrc: Oral  PainSc:       Patients Stated Pain Goal: 2 (02/11/16 2058)  Complications: No apparent anesthesia complications

## 2016-02-12 NOTE — Anesthesia Procedure Notes (Signed)
Procedure Name: Intubation Date/Time: 02/12/2016 6:02 PM Performed by: Tillman Abide Pre-anesthesia Checklist: Patient identified, Emergency Drugs available, Suction available and Patient being monitored Patient Re-evaluated:Patient Re-evaluated prior to inductionOxygen Delivery Method: Circle System Utilized Preoxygenation: Pre-oxygenation with 100% oxygen Intubation Type: IV induction Ventilation: Mask ventilation without difficulty Laryngoscope Size: Mac and 3 Grade View: Grade I Tube type: Oral Tube size: 7.0 mm Number of attempts: 1 Airway Equipment and Method: Stylet and Oral airway Placement Confirmation: ETT inserted through vocal cords under direct vision,  positive ETCO2 and breath sounds checked- equal and bilateral Secured at: 21 cm Tube secured with: Tape Dental Injury: Teeth and Oropharynx as per pre-operative assessment

## 2016-02-12 NOTE — Progress Notes (Signed)
Orthopedic Tech Progress Note Patient Details:  [name] 16-Aug-1930 [id number]  Patient ID: Rachel [name], female   DOB: Mar 26, 1931, 82 y.o.   MRN: [id number] Applied ohf to bed  Trinna Post 02/12/2016, 10:41 PM

## 2016-02-12 NOTE — Anesthesia Preprocedure Evaluation (Signed)
Anesthesia Evaluation  Patient identified by MRN, date of birth, ID band Patient awake    Reviewed: Allergy & Precautions, NPO status , Patient's Chart, lab work & pertinent test results, reviewed documented beta blocker date and time   History of Anesthesia Complications Negative for: history of anesthetic complications  Airway Mallampati: II  TM Distance: >3 FB Neck ROM: Full    Dental  (+) Teeth Intact   Pulmonary neg pulmonary ROS,    breath sounds clear to auscultation       Cardiovascular hypertension, Pt. on home beta blockers and Pt. on medications  Rhythm:Regular Rate:Tachycardia     Neuro/Psych negative neurological ROS  negative psych ROS   GI/Hepatic negative GI ROS, Neg liver ROS,   Endo/Other  negative endocrine ROS  Renal/GU negative Renal ROS     Musculoskeletal  (+) Arthritis , Left hip fracture   Abdominal   Peds  Hematology   Anesthesia Other Findings   Reproductive/Obstetrics                             Anesthesia Physical Anesthesia Plan  ASA: III  Anesthesia Plan: General   Post-op Pain Management:    Induction: Intravenous  Airway Management Planned: Oral ETT  Additional Equipment: None  Intra-op Plan:   Post-operative Plan: Extubation in OR  Informed Consent: I have reviewed the patients History and Physical, chart, labs and discussed the procedure including the risks, benefits and alternatives for the proposed anesthesia with the patient or authorized representative who has indicated his/her understanding and acceptance.   Dental advisory given  Plan Discussed with: Surgeon and CRNA  Anesthesia Plan Comments:         Anesthesia Quick Evaluation

## 2016-02-12 NOTE — Anesthesia Postprocedure Evaluation (Signed)
Anesthesia Post Note  Patient: Rachel [name]  Procedure(s) Performed: Procedure(s) (LRB): ARTHROPLASTY BIPOLAR HIP (HEMIARTHROPLASTY) (Left)  Patient location during evaluation: PACU Anesthesia Type: General Level of consciousness: awake and alert Pain management: pain level controlled Vital Signs Assessment: post-procedure vital signs reviewed and stable Respiratory status: spontaneous breathing, nonlabored ventilation, respiratory function stable and patient connected to nasal cannula oxygen Cardiovascular status: blood pressure returned to baseline and stable Postop Assessment: no signs of nausea or vomiting Anesthetic complications: no    Last Vitals:  Vitals:   02/12/16 2018 02/12/16 2100  BP: (!) 168/70 (!) 160/76  Pulse: 75 76  Resp: (!) 29 20  Temp:  36.8 C    Last Pain:  Vitals:   02/12/16 2100  TempSrc: Oral  PainSc:                  Cecile Hearing

## 2016-02-12 NOTE — H&P (View-Only) (Signed)
ORTHOPAEDIC CONSULTATION  REQUESTING PHYSICIAN: No att. providers found  Chief Complaint: Left hip pain  HPI: Rachel [name] is a 82 y.o. female who presents with a displaced left femoral neck fracture. Patient reports falling about 3 weeks ago sustaining the femoral neck fracture she presents at this time with pain and difficulty weightbearing.  Past Medical History:  Diagnosis Date  . Hypertension    History reviewed. No pertinent surgical history. Social History   Social History  . Marital status: Married    Spouse name: N/A  . Number of children: N/A  . Years of education: N/A   Social History Main Topics  . Smoking status: Never Smoker  . Smokeless tobacco: Never Used  . Alcohol use No  . Drug use: Unknown  . Sexual activity: Not Asked   Other Topics Concern  . None   Social History Narrative  . None   No family history on file. - negative except otherwise stated in the family history section Allergies  Allergen Reactions  . Aspirin     nosebleed   Prior to Admission medications   Medication Sig Start Date End Date Taking? Authorizing Provider  acetaminophen (TYLENOL) 500 MG tablet Take 500 mg by mouth every 6 (six) hours as needed for moderate pain.   Yes Historical Provider, MD  losartan (COZAAR) 100 MG tablet Take 100 mg by mouth daily.   Yes Historical Provider, MD  metoprolol tartrate (LOPRESSOR) 25 MG tablet Take 25 mg by mouth 2 (two) times daily.   Yes Historical Provider, MD   Dg Chest 1 View  Result Date: 02/11/2016 CLINICAL DATA:  Multiple falls. EXAM: CHEST 1 VIEW COMPARISON:  03/22/2010. FINDINGS: Normal sized heart. Clear lungs. Multiple calcified mediastinal and left hilar lymph nodes. Calcified granuloma in the medial aspect of the left lower lobe. Otherwise, clear lungs. Diffuse osteopenia. IMPRESSION: No acute abnormality.  Changes of previous granulomatous infection. Electronically Signed   By: Beckie Salts M.D.   On: 02/11/2016 13:42     Dg Elbow Complete Right  Result Date: 02/11/2016 CLINICAL DATA:  Larey Seat.  Right elbow pain. EXAM: RIGHT ELBOW - COMPLETE 3+ VIEW COMPARISON:  None. FINDINGS: Moderate degenerative changes and chondrocalcinosis. No acute fracture or joint effusion. IMPRESSION: No acute fracture or joint effusion. Degenerative changes and chondrocalcinosis. Electronically Signed   By: Rudie Meyer M.D.   On: 02/11/2016 13:39   Dg Ankle Complete Right  Result Date: 02/11/2016 CLINICAL DATA:  Right ankle pain and swelling following multiple falls. EXAM: RIGHT ANKLE - COMPLETE 3+ VIEW COMPARISON:  None. FINDINGS: Tendon, ligament and fascia calcifications. No fracture, dislocation or effusion seen. Calcaneal spurs. IMPRESSION: 1. No fracture or dislocation. 2. Tendon, ligament and fascia calcifications. Electronically Signed   By: Beckie Salts M.D.   On: 02/11/2016 13:44   Ct Head Wo Contrast  Result Date: 02/11/2016 CLINICAL DATA:  Status post trip and fall with a blow to the back of the head while walking last night. Initial encounter. EXAM: CT HEAD WITHOUT CONTRAST CT CERVICAL SPINE WITHOUT CONTRAST TECHNIQUE: Multidetector CT imaging of the head and cervical spine was performed following the standard protocol without intravenous contrast. Multiplanar CT image reconstructions of the cervical spine were also generated. COMPARISON:  None. FINDINGS: CT HEAD FINDINGS Brain: The brain is atrophic with chronic microvascular ischemic change. No acute abnormality including hemorrhage, infarct, mass lesion, mass effect, midline shift or abnormal extra-axial fluid collection. No hydrocephalus or pneumocephalus. Vascular: Unremarkable. Skull: Intact. Sinuses/Orbits: The patient  is status post right maxillary antrostomy. The right maxillary sinus appears atrophic with a thickened wall. Otherwise unremarkable. Other: None. CT CERVICAL SPINE FINDINGS Alignment: Maintained. Skull base and vertebrae: No fracture is identified.  Degenerative change at the articulations of the lateral masses of C1 and occipital condyles is seen, more notable on the right. Soft tissues and spinal canal: Unremarkable. Disc levels: Loss of disc space height is noted at C6-7. The C4-5 disc is largely calcified. Marked multilevel facet arthropathy is seen. The C4-5 and C5-6 facet joints are ankylosed bilaterally. Upper chest: Lung apices are clear. Other: None. IMPRESSION: No acute abnormality head or cervical spine. Cortical atrophy and chronic microvascular ischemic change. Cervical spondylosis. Electronically Signed   By: Drusilla Kannerhomas  Dalessio M.D.   On: 02/11/2016 13:02   Ct Cervical Spine Wo Contrast  Result Date: 02/11/2016 CLINICAL DATA:  Status post trip and fall with a blow to the back of the head while walking last night. Initial encounter. EXAM: CT HEAD WITHOUT CONTRAST CT CERVICAL SPINE WITHOUT CONTRAST TECHNIQUE: Multidetector CT imaging of the head and cervical spine was performed following the standard protocol without intravenous contrast. Multiplanar CT image reconstructions of the cervical spine were also generated. COMPARISON:  None. FINDINGS: CT HEAD FINDINGS Brain: The brain is atrophic with chronic microvascular ischemic change. No acute abnormality including hemorrhage, infarct, mass lesion, mass effect, midline shift or abnormal extra-axial fluid collection. No hydrocephalus or pneumocephalus. Vascular: Unremarkable. Skull: Intact. Sinuses/Orbits: The patient is status post right maxillary antrostomy. The right maxillary sinus appears atrophic with a thickened wall. Otherwise unremarkable. Other: None. CT CERVICAL SPINE FINDINGS Alignment: Maintained. Skull base and vertebrae: No fracture is identified. Degenerative change at the articulations of the lateral masses of C1 and occipital condyles is seen, more notable on the right. Soft tissues and spinal canal: Unremarkable. Disc levels: Loss of disc space height is noted at C6-7. The C4-5  disc is largely calcified. Marked multilevel facet arthropathy is seen. The C4-5 and C5-6 facet joints are ankylosed bilaterally. Upper chest: Lung apices are clear. Other: None. IMPRESSION: No acute abnormality head or cervical spine. Cortical atrophy and chronic microvascular ischemic change. Cervical spondylosis. Electronically Signed   By: Drusilla Kannerhomas  Dalessio M.D.   On: 02/11/2016 13:02   Dg Foot Complete Right  Result Date: 02/11/2016 CLINICAL DATA:  Larey SeatFell.  Right foot pain. EXAM: RIGHT FOOT COMPLETE - 3+ VIEW COMPARISON:  None. FINDINGS: Moderate osteoporosis. Moderate degenerative changes. No acute fractures identified. Small calcaneal heel spur and calcifications in the plantar fascia. IMPRESSION: No acute fracture. Electronically Signed   By: Rudie MeyerP.  Gallerani M.D.   On: 02/11/2016 13:40   Dg Hip Unilat With Pelvis 2-3 Views Left  Result Date: 02/11/2016 CLINICAL DATA:  Larey SeatFell. EXAM: DG HIP (WITH OR WITHOUT PELVIS) 2-3V LEFT COMPARISON:  None. FINDINGS: There is a displaced left femoral neck fracture. The right hip is intact. The pubic symphysis and SI joints are intact. No pelvic fractures or bone lesions. Vascular calcifications are noted. IMPRESSION: Displaced left femoral neck fracture. Electronically Signed   By: Rudie MeyerP.  Gallerani M.D.   On: 02/11/2016 13:41   - pertinent xrays, CT, MRI studies were reviewed and independently interpreted  Positive ROS: All other systems have been reviewed and were otherwise negative with the exception of those mentioned in the HPI and as above.  Physical Exam: General: Alert, no acute distress Psychiatric: Patient is competent for consent with normal mood and affect Lymphatic: No axillary or cervical lymphadenopathy Cardiovascular: No pedal edema  Respiratory: No cyanosis, no use of accessory musculature GI: No organomegaly, abdomen is soft and non-tender  Skin: Patient has no lacerations or abrasion over the hip.   Neurologic: Patient does  have protective  sensation bilateral lower extremities.   MUSCULOSKELETAL:  On examination patient has pain with internal and external rotation of the left hip. Her left lower extremity shortened and externally rotated. Review the radiographs shows a shortened and displaced femoral neck fracture chronic in nature. Assessment:   Assessment 3 weeks status post left femoral neck fracture. Plan: Plan: We will plan for left hip hemiarthroplasty. Risk and benefits were discussed which include infection neurovascular injury dislocation DVT pulmonary embolus mortality need for additional surgery. Patient states she understands wishes to proceed at this time we'll try to schedule patient for surgery on Friday tomorrow.  Thank you for the consult and the opportunity to see Ms. Rosalio Loud, MD [facility] [phone] 5:14 PM

## 2016-02-12 NOTE — Discharge Instructions (Signed)

## 2016-02-12 NOTE — Progress Notes (Signed)
[facility]                                                                              Patient Demographics  Rachel SilversmithShirley [name], is a 82 y.o. female, DOB - 08/14/1930, ZOX:[id number]  Admit date - 02/11/2016   Admitting Physician Ozella Rocksavid J Merrell, MD  Outpatient Primary MD for the patient is Katy ApoPOLITE,RONALD D, MD  Outpatient specialists:   LOS - 1  days    Chief Complaint  Patient presents with  . Fall  . Leg Pain  . Arm Injury       Brief summary   Rachel [name] is a 82 y.o. female with medical history significant for eye protection and osteoarthritis. Patient reports about 2 weeks ago she fell on her back after slipping in her car port. She had some left hip pain and significant bruising but because she was able to walk on the leg did not seek medical attention. Over the past several days she has noticed increasing pain in the left hip and unfortunately fell again this morning. Evaluation in the ER revealed a displaced left femoral neck fracture.   Assessment & Plan    Displaced fracture of left femoral neck -Status post multiple falls in the past 2 weeks, x-ray showed displaced fracture of the left femoral neck.  - Orthopedics consulted, planned for surgery today  - Pain control, DVT prophylaxis per orthopedics      Dehydration, moderate/Elevated CPK -CK 923 on admission, improving, with calcium 10.4 on admission, now improving  - Continue gentle hydration     HTN (hypertension) -Blood pressure somewhat uncontrolled and likely related to ongoing pain -Continue Lopressor, Cozaar - hydralazine as needed   code Status: dnr  DVT Prophylaxis:  Lovenox  Family Communication: Discussed in detail with the patient, all imaging results, lab results explained to the patient   Disposition Plan: Will likely need snf  Time Spent in minutes   25 minutes  Procedures:    Consultants:   Orthopedic  Antimicrobials:      Medications  Scheduled Meds: .  chlorhexidine  60 mL Topical Once  . docusate sodium  100 mg Oral BID  . enoxaparin (LOVENOX) injection  40 mg Subcutaneous Q24H  . losartan  100 mg Oral Daily  . metoprolol tartrate  25 mg Oral BID   Continuous Infusions: . sodium chloride 75 mL/hr at 02/12/16 0610   PRN Meds:.hydrALAZINE, HYDROcodone-acetaminophen, morphine injection   Antibiotics   Anti-infectives    None        Subjective:   Rachel [name] was seen and examined today. Pain currently controlled. Patient denies dizziness, chest pain, shortness of breath, abdominal pain, N/V/D/C, new weakness, numbess, tingling. No acute events overnight.    Objective:   Vitals:   02/11/16 1530 02/11/16 1618 02/11/16 2027 02/12/16 0629  BP: 159/77 (!) 155/85 138/74 (!) 157/64  Pulse: 102 95 99 76  Resp: 24 19 16 16   Temp:  98.3 F (36.8 C) 98.4 F (36.9 C) 97.8 F (36.6 C)  TempSrc:  Oral Oral Oral  SpO2: 98% 99% 98% 100%  Weight:      Height:  Intake/Output Summary (Last 24 hours) at 02/12/16 1040 Last data filed at 02/11/16 2026  Gross per 24 hour  Intake                0 ml  Output              350 ml  Net             -350 ml     Wt Readings from Last 3 Encounters:  02/11/16 50.8 kg (112 lb)     Exam  General: Alert and oriented x 3, NAD, Comfortable  HEENT:  PERRLA, EOMI, Anicteric Sclera, mucous membranes moist.   Neck: Supple, no JVD, no masses  Cardiovascular: S1 S2 auscultated, no rubs, murmurs or gallops. Regular rate and rhythm.  Respiratory: Clear to auscultation bilaterally, no wheezing, rales or rhonchi  Gastrointestinal: Soft, nontender, nondistended, + bowel sounds  Ext: no cyanosis clubbing or edema, Left leg shortened and internally rotated   Neuro: AAOx3, Cr N's II- XII. Strength 5/5 upper and lower extremities bilaterally  Skin: No rashes  Psych: Normal affect and demeanor, alert and oriented x3    Data Reviewed:  I have personally reviewed following labs and  imaging studies  Micro Results Recent Results (from the past 240 hour(s))  Surgical PCR screen     Status: Abnormal   Collection Time: 02/11/16  8:15 PM  Result Value Ref Range Status   MRSA, PCR NEGATIVE NEGATIVE Final   Staphylococcus aureus POSITIVE (A) NEGATIVE Final    Comment:        The Xpert SA Assay (FDA approved for NASAL specimens in patients over 5 years of age), is one component of a comprehensive surveillance program.  Test performance has been validated by [facility] for patients greater than or equal to 65 year old. It is not intended to diagnose infection nor to guide or monitor treatment.     Radiology Reports Dg Chest 1 View  Result Date: 02/11/2016 CLINICAL DATA:  Multiple falls. EXAM: CHEST 1 VIEW COMPARISON:  03/22/2010. FINDINGS: Normal sized heart. Clear lungs. Multiple calcified mediastinal and left hilar lymph nodes. Calcified granuloma in the medial aspect of the left lower lobe. Otherwise, clear lungs. Diffuse osteopenia. IMPRESSION: No acute abnormality.  Changes of previous granulomatous infection. Electronically Signed   By: Beckie Salts M.D.   On: 02/11/2016 13:42   Dg Elbow Complete Right  Result Date: 02/11/2016 CLINICAL DATA:  Larey Seat.  Right elbow pain. EXAM: RIGHT ELBOW - COMPLETE 3+ VIEW COMPARISON:  None. FINDINGS: Moderate degenerative changes and chondrocalcinosis. No acute fracture or joint effusion. IMPRESSION: No acute fracture or joint effusion. Degenerative changes and chondrocalcinosis. Electronically Signed   By: Rudie Meyer M.D.   On: 02/11/2016 13:39   Dg Ankle Complete Right  Result Date: 02/11/2016 CLINICAL DATA:  Right ankle pain and swelling following multiple falls. EXAM: RIGHT ANKLE - COMPLETE 3+ VIEW COMPARISON:  None. FINDINGS: Tendon, ligament and fascia calcifications. No fracture, dislocation or effusion seen. Calcaneal spurs. IMPRESSION: 1. No fracture or dislocation. 2. Tendon, ligament and fascia calcifications.  Electronically Signed   By: Beckie Salts M.D.   On: 02/11/2016 13:44   Ct Head Wo Contrast  Result Date: 02/11/2016 CLINICAL DATA:  Status post trip and fall with a blow to the back of the head while walking last night. Initial encounter. EXAM: CT HEAD WITHOUT CONTRAST CT CERVICAL SPINE WITHOUT CONTRAST TECHNIQUE: Multidetector CT imaging of the head and cervical spine was performed following the  standard protocol without intravenous contrast. Multiplanar CT image reconstructions of the cervical spine were also generated. COMPARISON:  None. FINDINGS: CT HEAD FINDINGS Brain: The brain is atrophic with chronic microvascular ischemic change. No acute abnormality including hemorrhage, infarct, mass lesion, mass effect, midline shift or abnormal extra-axial fluid collection. No hydrocephalus or pneumocephalus. Vascular: Unremarkable. Skull: Intact. Sinuses/Orbits: The patient is status post right maxillary antrostomy. The right maxillary sinus appears atrophic with a thickened wall. Otherwise unremarkable. Other: None. CT CERVICAL SPINE FINDINGS Alignment: Maintained. Skull base and vertebrae: No fracture is identified. Degenerative change at the articulations of the lateral masses of C1 and occipital condyles is seen, more notable on the right. Soft tissues and spinal canal: Unremarkable. Disc levels: Loss of disc space height is noted at C6-7. The C4-5 disc is largely calcified. Marked multilevel facet arthropathy is seen. The C4-5 and C5-6 facet joints are ankylosed bilaterally. Upper chest: Lung apices are clear. Other: None. IMPRESSION: No acute abnormality head or cervical spine. Cortical atrophy and chronic microvascular ischemic change. Cervical spondylosis. Electronically Signed   By: Drusilla Kanner M.D.   On: 02/11/2016 13:02   Ct Cervical Spine Wo Contrast  Result Date: 02/11/2016 CLINICAL DATA:  Status post trip and fall with a blow to the back of the head while walking last night. Initial  encounter. EXAM: CT HEAD WITHOUT CONTRAST CT CERVICAL SPINE WITHOUT CONTRAST TECHNIQUE: Multidetector CT imaging of the head and cervical spine was performed following the standard protocol without intravenous contrast. Multiplanar CT image reconstructions of the cervical spine were also generated. COMPARISON:  None. FINDINGS: CT HEAD FINDINGS Brain: The brain is atrophic with chronic microvascular ischemic change. No acute abnormality including hemorrhage, infarct, mass lesion, mass effect, midline shift or abnormal extra-axial fluid collection. No hydrocephalus or pneumocephalus. Vascular: Unremarkable. Skull: Intact. Sinuses/Orbits: The patient is status post right maxillary antrostomy. The right maxillary sinus appears atrophic with a thickened wall. Otherwise unremarkable. Other: None. CT CERVICAL SPINE FINDINGS Alignment: Maintained. Skull base and vertebrae: No fracture is identified. Degenerative change at the articulations of the lateral masses of C1 and occipital condyles is seen, more notable on the right. Soft tissues and spinal canal: Unremarkable. Disc levels: Loss of disc space height is noted at C6-7. The C4-5 disc is largely calcified. Marked multilevel facet arthropathy is seen. The C4-5 and C5-6 facet joints are ankylosed bilaterally. Upper chest: Lung apices are clear. Other: None. IMPRESSION: No acute abnormality head or cervical spine. Cortical atrophy and chronic microvascular ischemic change. Cervical spondylosis. Electronically Signed   By: Drusilla Kanner M.D.   On: 02/11/2016 13:02   Dg Foot Complete Right  Result Date: 02/11/2016 CLINICAL DATA:  Larey Seat.  Right foot pain. EXAM: RIGHT FOOT COMPLETE - 3+ VIEW COMPARISON:  None. FINDINGS: Moderate osteoporosis. Moderate degenerative changes. No acute fractures identified. Small calcaneal heel spur and calcifications in the plantar fascia. IMPRESSION: No acute fracture. Electronically Signed   By: Rudie Meyer M.D.   On: 02/11/2016 13:40     Dg Hip Unilat With Pelvis 2-3 Views Left  Result Date: 02/11/2016 CLINICAL DATA:  Larey Seat. EXAM: DG HIP (WITH OR WITHOUT PELVIS) 2-3V LEFT COMPARISON:  None. FINDINGS: There is a displaced left femoral neck fracture. The right hip is intact. The pubic symphysis and SI joints are intact. No pelvic fractures or bone lesions. Vascular calcifications are noted. IMPRESSION: Displaced left femoral neck fracture. Electronically Signed   By: Rudie Meyer M.D.   On: 02/11/2016 13:41    Lab Data:  CBC:  Recent Labs Lab 02/11/16 1235 02/12/16 0238  WBC 10.4 8.1  HGB 13.3 11.8*  HCT 41.7 38.2  MCV 87.8 89.3  PLT 229 194   Basic Metabolic Panel:  Recent Labs Lab 02/11/16 1235 02/11/16 1919 02/12/16 0238  NA 142  --  143  K 3.6  --  3.4*  CL 107  --  108  CO2 24  --  27  GLUCOSE 120*  --  102*  BUN 32*  --  31*  CREATININE 0.65  --  0.60  CALCIUM 10.4* 10.0 9.4   GFR: Estimated Creatinine Clearance: 40.2 mL/min (by C-G formula based on SCr of 0.6 mg/dL). Liver Function Tests:  Recent Labs Lab 02/11/16 1919  ALBUMIN 4.1   No results for input(s): LIPASE, AMYLASE in the last 168 hours. No results for input(s): AMMONIA in the last 168 hours. Coagulation Profile:  Recent Labs Lab 02/12/16 0238  INR 1.37   Cardiac Enzymes:  Recent Labs Lab 02/11/16 1235 02/12/16 0238  CKTOTAL 923* 673*   BNP (last 3 results) No results for input(s): PROBNP in the last 8760 hours. HbA1C: No results for input(s): HGBA1C in the last 72 hours. CBG:  Recent Labs Lab 02/11/16 1132  GLUCAP 114*   Lipid Profile: No results for input(s): CHOL, HDL, LDLCALC, TRIG, CHOLHDL, LDLDIRECT in the last 72 hours. Thyroid Function Tests: No results for input(s): TSH, T4TOTAL, FREET4, T3FREE, THYROIDAB in the last 72 hours. Anemia Panel: No results for input(s): VITAMINB12, FOLATE, FERRITIN, TIBC, IRON, RETICCTPCT in the last 72 hours. Urine analysis: No results found for: COLORURINE,  APPEARANCEUR, LABSPEC, PHURINE, GLUCOSEU, HGBUR, BILIRUBINUR, KETONESUR, PROTEINUR, UROBILINOGEN, NITRITE, Hurshel Party M.D. [facility] 02/12/2016, 10:40 AM  Pager: [phone] Between 7am to 7pm - call Pager - [phone]  After 7pm go to [email or web address] - password [email or web address]  Call night coverage person covering after 7pm

## 2016-02-12 NOTE — Interval H&P Note (Signed)
History and Physical Interval Note:  02/12/2016 6:59 AM  Rachel [name]  has presented today for surgery, with the diagnosis of left femoral neck fx  The various methods of treatment have been discussed with the patient and family. After consideration of risks, benefits and other options for treatment, the patient has consented to  Procedure(s): ARTHROPLASTY BIPOLAR HIP (HEMIARTHROPLASTY) (Left) as a surgical intervention .  The patient's history has been reviewed, patient examined, no change in status, stable for surgery.  I have reviewed the patient's chart and labs.  Questions were answered to the patient's satisfaction.     [name]

## 2016-02-12 NOTE — Progress Notes (Signed)
Anticoagulant CONSULT NOTE - INITIAL  Pharmacy Consult for warfarin Indication: VTE prophylaxis  Allergies  Allergen Reactions  . Aspirin     nosebleed    Patient Measurements: Height: 5\' 1"  (154.9 cm) Weight: 112 lb (50.8 kg) IBW/kg (Calculated) : 47.8  Vital Signs: Temp: 98 F (36.7 C) (09/15 2015) Temp Source: Oral (09/15 1703) BP: 168/70 (09/15 2018) Pulse Rate: 75 (09/15 2018) Intake/Output from previous day: 09/14 0701 - 09/15 0700 In: -  Out: 350 [Urine:350] Intake/Output from this shift: Total I/O In: 800 [I.V.:800] Out: -   Labs:  Recent Labs  02/11/16 1235 02/12/16 0238  WBC 10.4 8.1  HGB 13.3 11.8*  PLT 229 194  CREATININE 0.65 0.60   Estimated Creatinine Clearance: 40.2 mL/min (by C-G formula based on SCr of 0.6 mg/dL). No results for input(s): VANCOTROUGH, VANCOPEAK, VANCORANDOM, GENTTROUGH, GENTPEAK, GENTRANDOM, TOBRATROUGH, TOBRAPEAK, TOBRARND, AMIKACINPEAK, AMIKACINTROU, AMIKACIN in the last 72 hours.   Microbiology: Recent Results (from the past 720 hour(s))  Surgical PCR screen     Status: Abnormal   Collection Time: 02/11/16  8:15 PM  Result Value Ref Range Status   MRSA, PCR NEGATIVE NEGATIVE Final   Staphylococcus aureus POSITIVE (A) NEGATIVE Final    Comment:        The Xpert SA Assay (FDA approved for NASAL specimens in patients over 33 years of age), is one component of a comprehensive surveillance program.  Test performance has been validated by [facility] for patients greater than or equal to 48 year old. It is not intended to diagnose infection nor to guide or monitor treatment.     Medical History: Past Medical History:  Diagnosis Date  . Arthritis    OA  . Fracture of femoral neck (HCC) 02/11/2016   LEFT  . Hypertension     Medications:  Prescriptions Prior to Admission  Medication Sig Dispense Refill Last Dose  . acetaminophen (TYLENOL) 500 MG tablet Take 500 mg by mouth every 6 (six) hours as needed for  moderate pain.   02/10/2016 at Unknown time  . losartan (COZAAR) 100 MG tablet Take 100 mg by mouth daily.   02/10/2016 at Unknown time  . metoprolol tartrate (LOPRESSOR) 25 MG tablet Take 25 mg by mouth 2 (two) times daily.   02/10/2016 at 0900   Assessment: 82 year old female who presents with left hip fracture. Pharmacy has been consulted to dose warfarin. Patient was not taking anticoagulants prior to admission. Hemoglobin is low but stable and platelets are stable within normal limits.  Goal of Therapy:  INR 2-3 Monitor platelets by anticoagulation protocol: Yes  Plan:  Warfarin 4mg  once Daily INR and CBC Monitor for s/sx of bleeding  Ruben Im, PharmD Clinical Pharmacist Pager: [phone] 02/12/2016 9:04 PM

## 2016-02-13 LAB — BASIC METABOLIC PANEL
ANION GAP: 11 (ref 5–15)
BUN: 25 mg/dL — ABNORMAL HIGH (ref 6–20)
CO2: 24 mmol/L (ref 22–32)
Calcium: 9 mg/dL (ref 8.9–10.3)
Chloride: 107 mmol/L (ref 101–111)
Creatinine, Ser: 0.61 mg/dL (ref 0.44–1.00)
GFR calc Af Amer: >60 mL/min (ref >60)
GLUCOSE: 113 mg/dL — AB (ref 65–99)
POTASSIUM: 3.6 mmol/L (ref 3.5–5.1)
SODIUM: 142 mmol/L (ref 135–145)

## 2016-02-13 LAB — URINALYSIS, ROUTINE W REFLEX MICROSCOPIC
Bilirubin Urine: NEGATIVE
GLUCOSE, UA: NEGATIVE mg/dL
Hgb urine dipstick: NEGATIVE
Ketones, ur: 15 mg/dL — AB
LEUKOCYTES UA: NEGATIVE
Nitrite: NEGATIVE
PROTEIN: 30 mg/dL — AB
SPECIFIC GRAVITY, URINE: 1.019 (ref 1.005–1.030)
pH: 6 (ref 5.0–8.0)

## 2016-02-13 LAB — CBC
HEMATOCRIT: 36.3 % (ref 36.0–46.0)
HEMOGLOBIN: 11.3 g/dL — AB (ref 12.0–15.0)
MCH: 28 pg (ref 26.0–34.0)
MCHC: 31.1 g/dL (ref 30.0–36.0)
MCV: 89.9 fL (ref 78.0–100.0)
Platelets: 184 10*3/uL (ref 150–400)
RBC: 4.04 MIL/uL (ref 3.87–5.11)
RDW: 14 % (ref 11.5–15.5)
WBC: 8.2 10*3/uL (ref 4.0–10.5)

## 2016-02-13 LAB — GLUCOSE, CAPILLARY: GLUCOSE-CAPILLARY: 116 mg/dL — AB (ref 65–99)

## 2016-02-13 LAB — URINE MICROSCOPIC-ADD ON
RBC / HPF: NONE SEEN RBC/hpf (ref 0–5)
WBC UA: NONE SEEN WBC/hpf (ref 0–5)

## 2016-02-13 LAB — PROTIME-INR
INR: 1.4
Prothrombin Time: 17.2 seconds — ABNORMAL HIGH (ref 11.4–15.2)

## 2016-02-13 MED ORDER — WARFARIN SODIUM 3 MG PO TABS
3.0000 mg | ORAL_TABLET | Freq: Once | ORAL | Status: AC
  Administered 2016-02-13: 3 mg via ORAL
  Filled 2016-02-13: qty 1

## 2016-02-13 MED ORDER — WARFARIN VIDEO
Freq: Once | Status: AC
  Administered 2016-02-13: 1

## 2016-02-13 MED ORDER — COUMADIN BOOK
Freq: Once | Status: AC
  Administered 2016-02-13: 10:00:00
  Filled 2016-02-13: qty 1

## 2016-02-13 NOTE — Progress Notes (Signed)
Anticoagulant CONSULT NOTE - Follow-Up  Pharmacy Consult for warfarin Indication: VTE prophylaxis  Allergies  Allergen Reactions  . Aspirin     nosebleed    Patient Measurements: Height: 5\' 1"  (154.9 cm) Weight: 112 lb (50.8 kg) IBW/kg (Calculated) : 47.8  Vital Signs: Temp: 97.7 F (36.5 C) (09/16 0613) Temp Source: Oral (09/16 1610) BP: 136/61 (09/16 9604) Pulse Rate: 87 (09/16 0613) Intake/Output from previous day: 09/15 0701 - 09/16 0700 In: 800 [I.V.:800] Out: 750 [Urine:500; Blood:250] Intake/Output from this shift: Total I/O In: 86 [I.V.:86] Out: -   Labs:  Recent Labs  02/11/16 1235 02/12/16 0238 02/13/16 0606  WBC 10.4 8.1 8.2  HGB 13.3 11.8* 11.3*  PLT 229 194 184  CREATININE 0.65 0.60 0.61   Estimated Creatinine Clearance: 40.2 mL/min (by C-G formula based on SCr of 0.61 mg/dL). No results for input(s): VANCOTROUGH, VANCOPEAK, VANCORANDOM, GENTTROUGH, GENTPEAK, GENTRANDOM, TOBRATROUGH, TOBRAPEAK, TOBRARND, AMIKACINPEAK, AMIKACINTROU, AMIKACIN in the last 72 hours.   Microbiology: Recent Results (from the past 720 hour(s))  Surgical PCR screen     Status: Abnormal   Collection Time: 02/11/16  8:15 PM  Result Value Ref Range Status   MRSA, PCR NEGATIVE NEGATIVE Final   Staphylococcus aureus POSITIVE (A) NEGATIVE Final    Comment:        The Xpert SA Assay (FDA approved for NASAL specimens in patients over 37 years of age), is one component of a comprehensive surveillance program.  Test performance has been validated by [facility] for patients greater than or equal to 68 year old. It is not intended to diagnose infection nor to guide or monitor treatment.     Medical History: Past Medical History:  Diagnosis Date  . Arthritis    OA  . Fracture of femoral neck (HCC) 02/11/2016   LEFT  . Hypertension     Medications:  Prescriptions Prior to Admission  Medication Sig Dispense Refill Last Dose  . acetaminophen (TYLENOL) 500 MG  tablet Take 500 mg by mouth every 6 (six) hours as needed for moderate pain.   02/10/2016 at Unknown time  . losartan (COZAAR) 100 MG tablet Take 100 mg by mouth daily.   02/10/2016 at Unknown time  . metoprolol tartrate (LOPRESSOR) 25 MG tablet Take 25 mg by mouth 2 (two) times daily.   02/10/2016 at 0900   Assessment: 82 year old female who presents with left hip fracture. Pharmacy has been consulted to dose warfarin. Patient was not taking anticoagulants prior to admission. Baseline INR was 1.37. INR this AM is 1.40 after 1 dose of warfarin 4 mg. Patient's hemoglobin is low but stable and plts WNL. No signs of bleeding noted in chart. Patient is also on enoxaparin 40 mg every 24 hours with a CrCl ~40.  I will give her 3 mg tonight due to age and weight.  Goal of Therapy:  INR 2-3 Monitor platelets by anticoagulation protocol: Yes  Plan:  Warfarin 3mg  once Daily INR and CBC Monitor for s/sx of bleeding Enoxaparin until INR is therapeutic. Monitor renal function.  Bailey Mech, Ilda Basset D Pharmacy Resident Pager: [phone] 02/13/2016 9:48 AM

## 2016-02-13 NOTE — Plan of Care (Signed)
Problem: Pain Managment: Goal: General experience of comfort will improve Outcome: Progressing MEDICATED TWICE FOR PAIN

## 2016-02-13 NOTE — Evaluation (Signed)
Physical Therapy Evaluation Patient Details Name: Rachel FinnerShirley M [name] MRN: [id number] DOB: 10/25/1930 Today's Date: 02/13/2016   History of Present Illness  Pt is an 82 y.o. female with PMH consisting of OA and HTN. She fell at home sustaining a L hip fx and underwent L hemiarthroplasty.  Clinical Impression  Pt admitted with above diagnosis. Pt currently with functional limitations due to the deficits listed below (see PT Problem List). On eval, pt required total assist for bed mobility. She is very fearful and demonstrates self-limiting behavior in regards to mobility. Pt will benefit from skilled PT to increase their independence and safety with mobility to allow discharge to the venue listed below.       Follow Up Recommendations SNF (Pt prefers [facility] in [place].)    Equipment Recommendations  Rolling walker with 5" wheels    Recommendations for Other Services       Precautions / Restrictions Precautions Precautions: Posterior Hip Precaution Comments: Educated pt on 3/3 hip precautions. Handout provided. Restrictions Weight Bearing Restrictions: Yes LLE Weight Bearing: Weight bearing as tolerated      Mobility  Bed Mobility Overal bed mobility: Needs Assistance Bed Mobility: Supine to Sit;Sit to Supine     Supine to sit: Total assist Sit to supine: Total assist   General bed mobility comments: Pt very fearful and self limiting. Verbal cues for sequencing.  Transfers                 General transfer comment: Unable to progress beyond EOB.  Ambulation/Gait             General Gait Details: unable  Stairs            Wheelchair Mobility    Modified Rankin (Stroke Patients Only)       Balance                                             Pertinent Vitals/Pain Pain Assessment: 0-10 Pain Score: 8  Pain Location: sx site Pain Descriptors / Indicators: Aching;Sore;Grimacing;Guarding Pain Intervention(s): Limited  activity within patient's tolerance    Home Living Family/patient expects to be discharged to:: Private residence Living Arrangements: Alone   Type of Home: House Home Access: Stairs to enter   Secretary/administratorntrance Stairs-Number of Steps: 2 Home Layout: One level Home Equipment: Cane - single point;Walker - 2 wheels      Prior Function Level of Independence: Independent with assistive device(s)         Comments: Pt used cane for ambulation.     Hand Dominance        Extremity/Trunk Assessment   Upper Extremity Assessment: Defer to OT evaluation           Lower Extremity Assessment: LLE deficits/detail      Cervical / Trunk Assessment: Kyphotic  Communication   Communication: No difficulties  Cognition Arousal/Alertness: Awake/alert Behavior During Therapy: Anxious Overall Cognitive Status: Within Functional Limits for tasks assessed                      General Comments      Exercises General Exercises - Lower Extremity Ankle Circles/Pumps: AROM;Both;5 reps;Supine Quad Sets: AROM;Left;5 reps;Supine Heel Slides: AAROM;Left;5 reps;Supine   Assessment/Plan    PT Assessment Patient needs continued PT services  PT Problem List Decreased strength;Decreased range of motion;Decreased activity tolerance;Decreased balance;Decreased mobility;Decreased  knowledge of precautions;Pain;Decreased safety awareness;Decreased knowledge of use of DME          PT Treatment Interventions DME instruction;Gait training;Functional mobility training;Therapeutic activities;Therapeutic exercise;Balance training;Patient/family education    PT Goals (Current goals can be found in the Care Plan section)  Acute Rehab PT Goals Patient Stated Goal: not stated PT Goal Formulation: With patient Time For Goal Achievement: 02/27/16 Potential to Achieve Goals: Fair    Frequency Min 3X/week   Barriers to discharge Decreased caregiver support      Co-evaluation                End of Session   Activity Tolerance: Patient limited by pain Patient left: in bed;with call bell/phone within reach Nurse Communication: Mobility status         Time: 4098-1191 PT Time Calculation (min) (ACUTE ONLY): 19 min   Charges:   PT Evaluation $PT Eval Moderate Complexity: 1 Procedure     PT G Codes:        Ilda Foil 02/13/2016, 11:51 AM

## 2016-02-13 NOTE — Progress Notes (Signed)
[facility]                                                                              Patient Demographics  Rachel [name], is a 82 y.o. female, DOB - Jun 11, 1930, ZHY:[id number]  Admit date - 02/11/2016   Admitting Physician Ozella Rocks, MD  Outpatient Primary MD for the patient is Katy Apo, MD  Outpatient specialists:   LOS - 2  days    Chief Complaint  Patient presents with  . Fall  . Leg Pain  . Arm Injury       Brief summary   Rachel [name] is a 82 y.o. female with medical history significant for eye protection and osteoarthritis. Patient reports about 2 weeks ago she fell on her back after slipping in her car port. She had some left hip pain and significant bruising but because she was able to walk on the leg did not seek medical attention. Over the past several days she has noticed increasing pain in the left hip and unfortunately fell again this morning. Evaluation in the ER revealed a displaced left femoral neck fracture.   Assessment & Plan    Displaced fracture of left femoral neck -Status post multiple falls in the past 2 weeks, x-ray showed displaced fracture of the left femoral neck.  - Orthopedics was consulted, patient underwent left hip hemiarthroplasty, postop day #1   - Pain control, DVT prophylaxis per orthopedics  - Start PT today - Patient started on Coumadin for DVT prophylaxis per Ortho recommendation     Dehydration, moderate/Elevated CPK -CK 923 on admission, improving, with calcium 10.4 on admission, now improving  - Continue gentle hydration     HTN (hypertension) -Improving, Continue Lopressor, Cozaar - hydralazine as needed   code Status: dnr  DVT Prophylaxis:   Family Communication: Discussed in detail with the patient, all imaging results, lab results explained to the patient   Disposition Plan: Skilled nursing facility likely 24-48 hours   Time Spent in minutes   25 minutes  Procedures:     Consultants:   Orthopedic  Antimicrobials:      Medications  Scheduled Meds: . coumadin book   Does not apply Once  . docusate sodium  100 mg Oral BID  . enoxaparin (LOVENOX) injection  40 mg Subcutaneous Q24H  . losartan  100 mg Oral Daily  . metoprolol tartrate  25 mg Oral BID  . warfarin  3 mg Oral ONCE-1800  . Warfarin - Pharmacist Dosing Inpatient   Does not apply q1800   Continuous Infusions: . sodium chloride 75 mL/hr at 02/12/16 0610  . sodium chloride 10 mL/hr at 02/12/16 2324  . lactated ringers 10 mL/hr at 02/12/16 1732   PRN Meds:.acetaminophen **OR** acetaminophen, hydrALAZINE, HYDROcodone-acetaminophen, menthol-cetylpyridinium **OR** phenol, metoCLOPramide **OR** metoCLOPramide (REGLAN) injection, morphine injection, ondansetron **OR** ondansetron (ZOFRAN) IV   Antibiotics   Anti-infectives    Start     Dose/Rate Route Frequency Ordered Stop   02/13/16 0000  ceFAZolin (ANCEF) IVPB 2g/100 mL premix     2 g 200 mL/hr over 30 Minutes Intravenous Every 6 hours 02/12/16 2050 02/13/16 2952  02/12/16 1650  ceFAZolin (ANCEF) IVPB 2g/100 mL premix     2 g 200 mL/hr over 30 Minutes Intravenous To Short Stay 02/12/16 1536 02/12/16 1803        Subjective:   Rachel [name] was seen and examined today. Alert and oriented, Pain currently controlled. Patient denies dizziness, chest pain, shortness of breath, abdominal pain, N/V/D/C, new weakness, numbess, tingling. No acute events overnight.    Objective:   Vitals:   02/12/16 2018 02/12/16 2100 02/13/16 0107 02/13/16 0613  BP: (!) 168/70 (!) 160/76 120/72 136/61  Pulse: 75 76 82 87  Resp: (!) 29 20    Temp:  98.3 F (36.8 C) 97.7 F (36.5 C) 97.7 F (36.5 C)  TempSrc:  Oral Oral Oral  SpO2: 100% 100% 99% 100%  Weight:      Height:        Intake/Output Summary (Last 24 hours) at 02/13/16 1053 Last data filed at 02/13/16 0800  Gross per 24 hour  Intake              886 ml  Output              750  ml  Net              136 ml     Wt Readings from Last 3 Encounters:  02/11/16 50.8 kg (112 lb)     Exam  General: Alert and oriented x 3, NAD, Comfortable  HEENT:    Neck: Supple, no JVD,  Cardiovascular: S1 S2 clear, RRR  Respiratory: Clear to auscultation bilaterally, no wheezing, rales or rhonchi  Gastrointestinal: Soft, nontender, nondistended, + bowel sounds  Ext: no cyanosis clubbing or edema  Neuro: no new deficits  Skin: No rashes  Psych: Normal affect and demeanor, alert and oriented x3    Data Reviewed:  I have personally reviewed following labs and imaging studies  Micro Results Recent Results (from the past 240 hour(s))  Surgical PCR screen     Status: Abnormal   Collection Time: 02/11/16  8:15 PM  Result Value Ref Range Status   MRSA, PCR NEGATIVE NEGATIVE Final   Staphylococcus aureus POSITIVE (A) NEGATIVE Final    Comment:        The Xpert SA Assay (FDA approved for NASAL specimens in patients over 80 years of age), is one component of a comprehensive surveillance program.  Test performance has been validated by [facility] for patients greater than or equal to 429 year old. It is not intended to diagnose infection nor to guide or monitor treatment.     Radiology Reports Dg Chest 1 View  Result Date: 02/11/2016 CLINICAL DATA:  Multiple falls. EXAM: CHEST 1 VIEW COMPARISON:  03/22/2010. FINDINGS: Normal sized heart. Clear lungs. Multiple calcified mediastinal and left hilar lymph nodes. Calcified granuloma in the medial aspect of the left lower lobe. Otherwise, clear lungs. Diffuse osteopenia. IMPRESSION: No acute abnormality.  Changes of previous granulomatous infection. Electronically Signed   By: Beckie SaltsSteven  Reid M.D.   On: 02/11/2016 13:42   Dg Elbow Complete Right  Result Date: 02/11/2016 CLINICAL DATA:  Larey SeatFell.  Right elbow pain. EXAM: RIGHT ELBOW - COMPLETE 3+ VIEW COMPARISON:  None. FINDINGS: Moderate degenerative changes and  chondrocalcinosis. No acute fracture or joint effusion. IMPRESSION: No acute fracture or joint effusion. Degenerative changes and chondrocalcinosis. Electronically Signed   By: Rudie MeyerP.  Gallerani M.D.   On: 02/11/2016 13:39   Dg Ankle Complete Right  Result Date: 02/11/2016 CLINICAL DATA:  Right ankle pain and swelling following multiple falls. EXAM: RIGHT ANKLE - COMPLETE 3+ VIEW COMPARISON:  None. FINDINGS: Tendon, ligament and fascia calcifications. No fracture, dislocation or effusion seen. Calcaneal spurs. IMPRESSION: 1. No fracture or dislocation. 2. Tendon, ligament and fascia calcifications. Electronically Signed   By: Beckie Salts M.D.   On: 02/11/2016 13:44   Ct Head Wo Contrast  Result Date: 02/11/2016 CLINICAL DATA:  Status post trip and fall with a blow to the back of the head while walking last night. Initial encounter. EXAM: CT HEAD WITHOUT CONTRAST CT CERVICAL SPINE WITHOUT CONTRAST TECHNIQUE: Multidetector CT imaging of the head and cervical spine was performed following the standard protocol without intravenous contrast. Multiplanar CT image reconstructions of the cervical spine were also generated. COMPARISON:  None. FINDINGS: CT HEAD FINDINGS Brain: The brain is atrophic with chronic microvascular ischemic change. No acute abnormality including hemorrhage, infarct, mass lesion, mass effect, midline shift or abnormal extra-axial fluid collection. No hydrocephalus or pneumocephalus. Vascular: Unremarkable. Skull: Intact. Sinuses/Orbits: The patient is status post right maxillary antrostomy. The right maxillary sinus appears atrophic with a thickened wall. Otherwise unremarkable. Other: None. CT CERVICAL SPINE FINDINGS Alignment: Maintained. Skull base and vertebrae: No fracture is identified. Degenerative change at the articulations of the lateral masses of C1 and occipital condyles is seen, more notable on the right. Soft tissues and spinal canal: Unremarkable. Disc levels: Loss of disc space  height is noted at C6-7. The C4-5 disc is largely calcified. Marked multilevel facet arthropathy is seen. The C4-5 and C5-6 facet joints are ankylosed bilaterally. Upper chest: Lung apices are clear. Other: None. IMPRESSION: No acute abnormality head or cervical spine. Cortical atrophy and chronic microvascular ischemic change. Cervical spondylosis. Electronically Signed   By: Drusilla Kanner M.D.   On: 02/11/2016 13:02   Ct Cervical Spine Wo Contrast  Result Date: 02/11/2016 CLINICAL DATA:  Status post trip and fall with a blow to the back of the head while walking last night. Initial encounter. EXAM: CT HEAD WITHOUT CONTRAST CT CERVICAL SPINE WITHOUT CONTRAST TECHNIQUE: Multidetector CT imaging of the head and cervical spine was performed following the standard protocol without intravenous contrast. Multiplanar CT image reconstructions of the cervical spine were also generated. COMPARISON:  None. FINDINGS: CT HEAD FINDINGS Brain: The brain is atrophic with chronic microvascular ischemic change. No acute abnormality including hemorrhage, infarct, mass lesion, mass effect, midline shift or abnormal extra-axial fluid collection. No hydrocephalus or pneumocephalus. Vascular: Unremarkable. Skull: Intact. Sinuses/Orbits: The patient is status post right maxillary antrostomy. The right maxillary sinus appears atrophic with a thickened wall. Otherwise unremarkable. Other: None. CT CERVICAL SPINE FINDINGS Alignment: Maintained. Skull base and vertebrae: No fracture is identified. Degenerative change at the articulations of the lateral masses of C1 and occipital condyles is seen, more notable on the right. Soft tissues and spinal canal: Unremarkable. Disc levels: Loss of disc space height is noted at C6-7. The C4-5 disc is largely calcified. Marked multilevel facet arthropathy is seen. The C4-5 and C5-6 facet joints are ankylosed bilaterally. Upper chest: Lung apices are clear. Other: None. IMPRESSION: No acute  abnormality head or cervical spine. Cortical atrophy and chronic microvascular ischemic change. Cervical spondylosis. Electronically Signed   By: Drusilla Kanner M.D.   On: 02/11/2016 13:02   Dg Foot Complete Right  Result Date: 02/11/2016 CLINICAL DATA:  Larey Seat.  Right foot pain. EXAM: RIGHT FOOT COMPLETE - 3+ VIEW COMPARISON:  None. FINDINGS: Moderate osteoporosis. Moderate degenerative changes. No acute fractures identified. Small  calcaneal heel spur and calcifications in the plantar fascia. IMPRESSION: No acute fracture. Electronically Signed   By: Rudie Meyer M.D.   On: 02/11/2016 13:40   Dg Hip Unilat With Pelvis 2-3 Views Left  Result Date: 02/11/2016 CLINICAL DATA:  Larey Seat. EXAM: DG HIP (WITH OR WITHOUT PELVIS) 2-3V LEFT COMPARISON:  None. FINDINGS: There is a displaced left femoral neck fracture. The right hip is intact. The pubic symphysis and SI joints are intact. No pelvic fractures or bone lesions. Vascular calcifications are noted. IMPRESSION: Displaced left femoral neck fracture. Electronically Signed   By: Rudie Meyer M.D.   On: 02/11/2016 13:41    Lab Data:  CBC:  Recent Labs Lab 02/11/16 1235 02/12/16 0238 02/13/16 0606  WBC 10.4 8.1 8.2  HGB 13.3 11.8* 11.3*  HCT 41.7 38.2 36.3  MCV 87.8 89.3 89.9  PLT 229 194 184   Basic Metabolic Panel:  Recent Labs Lab 02/11/16 1235 02/11/16 1919 02/12/16 0238 02/13/16 0606  NA 142  --  143 142  K 3.6  --  3.4* 3.6  CL 107  --  108 107  CO2 24  --  27 24  GLUCOSE 120*  --  102* 113*  BUN 32*  --  31* 25*  CREATININE 0.65  --  0.60 0.61  CALCIUM 10.4* 10.0 9.4 9.0   GFR: Estimated Creatinine Clearance: 40.2 mL/min (by C-G formula based on SCr of 0.61 mg/dL). Liver Function Tests:  Recent Labs Lab 02/11/16 1919  ALBUMIN 4.1   No results for input(s): LIPASE, AMYLASE in the last 168 hours. No results for input(s): AMMONIA in the last 168 hours. Coagulation Profile:  Recent Labs Lab 02/12/16 0238  02/13/16 0444  INR 1.37 1.40   Cardiac Enzymes:  Recent Labs Lab 02/11/16 1235 02/12/16 0238  CKTOTAL 923* 673*   BNP (last 3 results) No results for input(s): PROBNP in the last 8760 hours. HbA1C: No results for input(s): HGBA1C in the last 72 hours. CBG:  Recent Labs Lab 02/11/16 1132  GLUCAP 114*   Lipid Profile: No results for input(s): CHOL, HDL, LDLCALC, TRIG, CHOLHDL, LDLDIRECT in the last 72 hours. Thyroid Function Tests: No results for input(s): TSH, T4TOTAL, FREET4, T3FREE, THYROIDAB in the last 72 hours. Anemia Panel: No results for input(s): VITAMINB12, FOLATE, FERRITIN, TIBC, IRON, RETICCTPCT in the last 72 hours. Urine analysis:    Component Value Date/Time   COLORURINE YELLOW 02/13/2016 0514   APPEARANCEUR CLEAR 02/13/2016 0514   LABSPEC 1.019 02/13/2016 0514   PHURINE 6.0 02/13/2016 0514   GLUCOSEU NEGATIVE 02/13/2016 0514   HGBUR NEGATIVE 02/13/2016 0514   BILIRUBINUR NEGATIVE 02/13/2016 0514   KETONESUR 15 (A) 02/13/2016 0514   PROTEINUR 30 (A) 02/13/2016 0514   NITRITE NEGATIVE 02/13/2016 0514   LEUKOCYTESUR NEGATIVE 02/13/2016 0514     RAI,RIPUDEEP M.D. [facility] 02/13/2016, 10:53 AM  Pager: [phone] Between 7am to 7pm - call Pager - [phone]  After 7pm go to [email or web address] - password [email or web address]  Call night coverage person covering after 7pm

## 2016-02-13 NOTE — Progress Notes (Signed)
Patient ID: Rachel [name], female   DOB: [birth year], 82 y.o.   MRN: [id number] Postoperative day 1 left hip hemiarthroplasty. Patient is resting comfortably this morning without complaints. Plan for physical therapy progressive ambulation weightbearing as tolerated on the left. Anticipate discharge to skilled nursing.

## 2016-02-14 DIAGNOSIS — W19XXXD Unspecified fall, subsequent encounter: Secondary | ICD-10-CM

## 2016-02-14 LAB — BASIC METABOLIC PANEL
ANION GAP: 7 (ref 5–15)
BUN: 28 mg/dL — ABNORMAL HIGH (ref 6–20)
CALCIUM: 8.7 mg/dL — AB (ref 8.9–10.3)
CO2: 24 mmol/L (ref 22–32)
CREATININE: 0.64 mg/dL (ref 0.44–1.00)
Chloride: 108 mmol/L (ref 101–111)
Glucose, Bld: 103 mg/dL — ABNORMAL HIGH (ref 65–99)
Potassium: 3.5 mmol/L (ref 3.5–5.1)
SODIUM: 139 mmol/L (ref 135–145)

## 2016-02-14 LAB — CBC
HEMATOCRIT: 33 % — AB (ref 36.0–46.0)
Hemoglobin: 10.4 g/dL — ABNORMAL LOW (ref 12.0–15.0)
MCH: 27.9 pg (ref 26.0–34.0)
MCHC: 31.5 g/dL (ref 30.0–36.0)
MCV: 88.5 fL (ref 78.0–100.0)
PLATELETS: 176 10*3/uL (ref 150–400)
RBC: 3.73 MIL/uL — ABNORMAL LOW (ref 3.87–5.11)
RDW: 13.9 % (ref 11.5–15.5)
WBC: 7.2 10*3/uL (ref 4.0–10.5)

## 2016-02-14 LAB — PROTIME-INR
INR: 2.11
PROTHROMBIN TIME: 24 s — AB (ref 11.4–15.2)

## 2016-02-14 MED ORDER — WARFARIN SODIUM 3 MG PO TABS: 3.0000 mg | ORAL_TABLET | Freq: Every day | ORAL | Status: DC

## 2016-02-14 MED ORDER — DOCUSATE SODIUM 100 MG PO CAPS: 100.0000 mg | ORAL_CAPSULE | Freq: Two times a day (BID) | ORAL | 0 refills | Status: DC

## 2016-02-14 MED ORDER — POLYETHYLENE GLYCOL 3350 17 G PO PACK: 17.0000 g | PACK | Freq: Every day | ORAL | 0 refills | Status: DC

## 2016-02-14 MED ORDER — BISACODYL 10 MG RE SUPP
10.0000 mg | Freq: Once | RECTAL | Status: DC
  Filled 2016-02-14: qty 1

## 2016-02-14 MED ORDER — WARFARIN SODIUM 2 MG PO TABS: 2.0000 mg | ORAL_TABLET | Freq: Once | ORAL | Status: DC

## 2016-02-14 MED ORDER — POLYETHYLENE GLYCOL 3350 17 G PO PACK
17.0000 g | PACK | Freq: Every day | ORAL | Status: DC
  Administered 2016-02-14: 17 g via ORAL
  Filled 2016-02-14 (×2): qty 1

## 2016-02-14 NOTE — Progress Notes (Signed)
Left voicemail with social work regarding patient discharge to SNF.  Will continue to follow up with social work.

## 2016-02-14 NOTE — Progress Notes (Signed)
OT Cancellation Note  Patient Details Name: Rachel [name] MRN: [id number] DOB: 20-May-1931   Cancelled Treatment:    Reason Eval/Treat Not Completed: Other (comment). Pt is Medicare and current D/C plan is SNF. No apparent immediate acute care OT needs, therefore will defer OT to SNF. If OT eval is needed please call Acute Rehab Dept. at [phone] or text page OT at [phone].    Evette Georges [phone] 02/14/2016, 6:56 AM

## 2016-02-14 NOTE — Clinical Social Work Placement (Signed)
   CLINICAL SOCIAL WORK PLACEMENT  NOTE  Date:  02/14/2016  Patient Details  Name: SHAWNTIA SANTELL MRN: [id number] Date of Birth: Aug 22, 1930  Clinical Social Work is seeking post-discharge placement for this patient at the Skilled  Nursing Facility level of care (*CSW will initial, date and re-position this form in  chart as items are completed):  Yes   Patient/family provided with [place] Clinical Social Work Department's list of facilities offering this level of care within the geographic area requested by the patient (or if unable, by the patient's family).  Yes   Patient/family informed of their freedom to choose among providers that offer the needed level of care, that participate in Medicare, Medicaid or managed care program needed by the patient, have an available bed and are willing to accept the patient.  Yes   Patient/family informed of [facility]'s ownership interest in [facility] and [facility], as well as of the fact that they are under no obligation to receive care at these facilities.  PASRR submitted to EDS on 02/14/16     PASRR number received on 02/14/16     Existing PASRR number confirmed on       FL2 transmitted to all facilities in geographic area requested by pt/family on 02/14/16     FL2 transmitted to all facilities within larger geographic area on       Patient informed that his/her managed care company has contracts with or will negotiate with certain facilities, including the following:            Patient/family informed of bed offers received.  Patient chooses bed at       Physician recommends and patient chooses bed at      Patient to be transferred to   on  .  Patient to be transferred to facility by       Patient family notified on   of transfer.  Name of family member notified:        PHYSICIAN Please prepare priority discharge summary, including medications, Please prepare prescriptions, Please sign FL2, Please sign DNR      Additional Comment:    _______________________________________________ Terald Sleeper, LCSW 02/14/2016, 12:04 PM

## 2016-02-14 NOTE — Progress Notes (Signed)
Patient's neighbor has the patient's house key.  The patient indicates she is going to get her clothes from home.  She would like to be contacted on discharge so that she can bring the patient's belongings to the facility.  Her number is [phone].  She can be reached [facility].

## 2016-02-14 NOTE — Plan of Care (Signed)
Problem: Pain Managment: Goal: General experience of comfort will improve Outcome: Progressing Denies pain  Problem: Bowel/Gastric: Goal: Will not experience complications related to bowel motility Outcome: Progressing No bowel issues reported

## 2016-02-14 NOTE — Progress Notes (Signed)
[facility]                                                                              Patient Demographics  Rachel [name], is a 82 y.o. female, DOB - 07/26/1930, NFA:[id number]  Admit date - 02/11/2016   Admitting Physician Ozella Rocksavid J Merrell, MD  Outpatient Primary MD for the patient is Katy ApoPOLITE,RONALD D, MD  Outpatient specialists:   LOS - 3  days    Chief Complaint  Patient presents with  . Fall  . Leg Pain  . Arm Injury       Brief summary   Rachel [name] is a 82 y.o. female with medical history significant for eye protection and osteoarthritis. Patient reports about 2 weeks ago she fell on her back after slipping in her car port. She had some left hip pain and significant bruising but because she was able to walk on the leg did not seek medical attention. Over the past several days she has noticed increasing pain in the left hip and unfortunately fell again this morning. Evaluation in the ER revealed a displaced left femoral neck fracture.   Assessment & Plan    Displaced fracture of left femoral neck -Status post multiple falls in the past 2 weeks, x-ray showed displaced fracture of the left femoral neck.  - Orthopedics was consulted, patient underwent left hip hemiarthroplasty, postop day #2   - Pain control, DVT prophylaxis per orthopedics  - PT recommended skilled nursing facility - I just spoke with Dr. Lajoyce Cornersuda, recommended to discontinue Coumadin with her age and possibility of bleeding or fall. Patient has allergy to aspirin. Dr. Lajoyce Cornersuda recommended no Coumadin or aspirin or Lovenox at this time for discharge. I have canceled Coumadin.    Dehydration, moderate/Elevated CPK -CK 923 on admission, improving, with calcium 10.4 on admission, now improving     HTN (hypertension) -Improving, Continue Lopressor, Cozaar - hydralazine as needed  Constipation Placed on bowel regimen   code Status: dnr  DVT Prophylaxis:   Family Communication: Discussed in  detail with the patient, all imaging results, lab results explained to the patient   Disposition Plan: Skilled nursing facility when bed available   Time Spent in minutes   15 minutes  Procedures:    Consultants:   Orthopedic  Antimicrobials:      Medications  Scheduled Meds: . docusate sodium  100 mg Oral BID  . enoxaparin (LOVENOX) injection  40 mg Subcutaneous Q24H  . losartan  100 mg Oral Daily  . metoprolol tartrate  25 mg Oral BID  . warfarin  2 mg Oral ONCE-1800  . Warfarin - Pharmacist Dosing Inpatient   Does not apply q1800   Continuous Infusions: . sodium chloride Stopped (02/13/16 1500)  . sodium chloride Stopped (02/13/16 1500)  . lactated ringers Stopped (02/13/16 1500)   PRN Meds:.acetaminophen **OR** acetaminophen, hydrALAZINE, HYDROcodone-acetaminophen, menthol-cetylpyridinium **OR** phenol, metoCLOPramide **OR** metoCLOPramide (REGLAN) injection, morphine injection, ondansetron **OR** ondansetron (ZOFRAN) IV   Antibiotics   Anti-infectives    Start     Dose/Rate Route Frequency Ordered Stop   02/13/16 0000  ceFAZolin (ANCEF) IVPB 2g/100 mL premix  2 g 200 mL/hr over 30 Minutes Intravenous Every 6 hours 02/12/16 2050 02/13/16 0653   02/12/16 1650  ceFAZolin (ANCEF) IVPB 2g/100 mL premix     2 g 200 mL/hr over 30 Minutes Intravenous To Short Stay 02/12/16 1536 02/12/16 1803        Subjective:   Nieshia Larmon was seen and examined today. Alert and oriented, Pain currently controlled, Complaining of constipation. Patient denies dizziness, chest pain, shortness of breath, abdominal pain, N/V/D/C, new weakness, numbess, tingling. No acute events overnight.    Objective:   Vitals:   02/13/16 0613 02/13/16 1500 02/13/16 2027 02/14/16 0553  BP: 136/61 (!) 145/69 120/62 (!) 143/60  Pulse: 87 96 99 84  Resp:      Temp: 97.7 F (36.5 C) 98.3 F (36.8 C) 98 F (36.7 C) 98.5 F (36.9 C)  TempSrc: Oral Oral Oral Oral  SpO2: 100% 99% 97% 97%    Weight:      Height:        Intake/Output Summary (Last 24 hours) at 02/14/16 1030 Last data filed at 02/13/16 1500  Gross per 24 hour  Intake           3764.5 ml  Output                0 ml  Net           3764.5 ml     Wt Readings from Last 3 Encounters:  02/11/16 50.8 kg (112 lb)     Exam  General: Alert and oriented x 3, NAD, Comfortable  HEENT:    Neck: Supple, no JVD,  Cardiovascular: S1 S2 clear, RRR  Respiratory: CTAB  Gastrointestinal: Soft, NT, ND, nbs  Ext: no cyanosis clubbing or edema  Neuro: no new deficits  Skin: No rashes  Psych: Normal affect and demeanor, alert and oriented x3    Data Reviewed:  I have personally reviewed following labs and imaging studies  Micro Results Recent Results (from the past 240 hour(s))  Surgical PCR screen     Status: Abnormal   Collection Time: 02/11/16  8:15 PM  Result Value Ref Range Status   MRSA, PCR NEGATIVE NEGATIVE Final   Staphylococcus aureus POSITIVE (A) NEGATIVE Final    Comment:        The Xpert SA Assay (FDA approved for NASAL specimens in patients over 17 years of age), is one component of a comprehensive surveillance program.  Test performance has been validated by [facility] for patients greater than or equal to 51 year old. It is not intended to diagnose infection nor to guide or monitor treatment.     Radiology Reports Dg Chest 1 View  Result Date: 02/11/2016 CLINICAL DATA:  Multiple falls. EXAM: CHEST 1 VIEW COMPARISON:  03/22/2010. FINDINGS: Normal sized heart. Clear lungs. Multiple calcified mediastinal and left hilar lymph nodes. Calcified granuloma in the medial aspect of the left lower lobe. Otherwise, clear lungs. Diffuse osteopenia. IMPRESSION: No acute abnormality.  Changes of previous granulomatous infection. Electronically Signed   By: Beckie Salts M.D.   On: 02/11/2016 13:42   Dg Elbow Complete Right  Result Date: 02/11/2016 CLINICAL DATA:  Larey Seat.  Right elbow pain.  EXAM: RIGHT ELBOW - COMPLETE 3+ VIEW COMPARISON:  None. FINDINGS: Moderate degenerative changes and chondrocalcinosis. No acute fracture or joint effusion. IMPRESSION: No acute fracture or joint effusion. Degenerative changes and chondrocalcinosis. Electronically Signed   By: Rudie Meyer M.D.   On: 02/11/2016 13:39   Dg  Ankle Complete Right  Result Date: 02/11/2016 CLINICAL DATA:  Right ankle pain and swelling following multiple falls. EXAM: RIGHT ANKLE - COMPLETE 3+ VIEW COMPARISON:  None. FINDINGS: Tendon, ligament and fascia calcifications. No fracture, dislocation or effusion seen. Calcaneal spurs. IMPRESSION: 1. No fracture or dislocation. 2. Tendon, ligament and fascia calcifications. Electronically Signed   By: Beckie Salts M.D.   On: 02/11/2016 13:44   Ct Head Wo Contrast  Result Date: 02/11/2016 CLINICAL DATA:  Status post trip and fall with a blow to the back of the head while walking last night. Initial encounter. EXAM: CT HEAD WITHOUT CONTRAST CT CERVICAL SPINE WITHOUT CONTRAST TECHNIQUE: Multidetector CT imaging of the head and cervical spine was performed following the standard protocol without intravenous contrast. Multiplanar CT image reconstructions of the cervical spine were also generated. COMPARISON:  None. FINDINGS: CT HEAD FINDINGS Brain: The brain is atrophic with chronic microvascular ischemic change. No acute abnormality including hemorrhage, infarct, mass lesion, mass effect, midline shift or abnormal extra-axial fluid collection. No hydrocephalus or pneumocephalus. Vascular: Unremarkable. Skull: Intact. Sinuses/Orbits: The patient is status post right maxillary antrostomy. The right maxillary sinus appears atrophic with a thickened wall. Otherwise unremarkable. Other: None. CT CERVICAL SPINE FINDINGS Alignment: Maintained. Skull base and vertebrae: No fracture is identified. Degenerative change at the articulations of the lateral masses of C1 and occipital condyles is seen, more  notable on the right. Soft tissues and spinal canal: Unremarkable. Disc levels: Loss of disc space height is noted at C6-7. The C4-5 disc is largely calcified. Marked multilevel facet arthropathy is seen. The C4-5 and C5-6 facet joints are ankylosed bilaterally. Upper chest: Lung apices are clear. Other: None. IMPRESSION: No acute abnormality head or cervical spine. Cortical atrophy and chronic microvascular ischemic change. Cervical spondylosis. Electronically Signed   By: Drusilla Kanner M.D.   On: 02/11/2016 13:02   Ct Cervical Spine Wo Contrast  Result Date: 02/11/2016 CLINICAL DATA:  Status post trip and fall with a blow to the back of the head while walking last night. Initial encounter. EXAM: CT HEAD WITHOUT CONTRAST CT CERVICAL SPINE WITHOUT CONTRAST TECHNIQUE: Multidetector CT imaging of the head and cervical spine was performed following the standard protocol without intravenous contrast. Multiplanar CT image reconstructions of the cervical spine were also generated. COMPARISON:  None. FINDINGS: CT HEAD FINDINGS Brain: The brain is atrophic with chronic microvascular ischemic change. No acute abnormality including hemorrhage, infarct, mass lesion, mass effect, midline shift or abnormal extra-axial fluid collection. No hydrocephalus or pneumocephalus. Vascular: Unremarkable. Skull: Intact. Sinuses/Orbits: The patient is status post right maxillary antrostomy. The right maxillary sinus appears atrophic with a thickened wall. Otherwise unremarkable. Other: None. CT CERVICAL SPINE FINDINGS Alignment: Maintained. Skull base and vertebrae: No fracture is identified. Degenerative change at the articulations of the lateral masses of C1 and occipital condyles is seen, more notable on the right. Soft tissues and spinal canal: Unremarkable. Disc levels: Loss of disc space height is noted at C6-7. The C4-5 disc is largely calcified. Marked multilevel facet arthropathy is seen. The C4-5 and C5-6 facet joints are  ankylosed bilaterally. Upper chest: Lung apices are clear. Other: None. IMPRESSION: No acute abnormality head or cervical spine. Cortical atrophy and chronic microvascular ischemic change. Cervical spondylosis. Electronically Signed   By: Drusilla Kanner M.D.   On: 02/11/2016 13:02   Dg Foot Complete Right  Result Date: 02/11/2016 CLINICAL DATA:  Larey Seat.  Right foot pain. EXAM: RIGHT FOOT COMPLETE - 3+ VIEW COMPARISON:  None. FINDINGS:  Moderate osteoporosis. Moderate degenerative changes. No acute fractures identified. Small calcaneal heel spur and calcifications in the plantar fascia. IMPRESSION: No acute fracture. Electronically Signed   By: Rudie Meyer M.D.   On: 02/11/2016 13:40   Dg Hip Unilat With Pelvis 2-3 Views Left  Result Date: 02/11/2016 CLINICAL DATA:  Larey Seat. EXAM: DG HIP (WITH OR WITHOUT PELVIS) 2-3V LEFT COMPARISON:  None. FINDINGS: There is a displaced left femoral neck fracture. The right hip is intact. The pubic symphysis and SI joints are intact. No pelvic fractures or bone lesions. Vascular calcifications are noted. IMPRESSION: Displaced left femoral neck fracture. Electronically Signed   By: Rudie Meyer M.D.   On: 02/11/2016 13:41    Lab Data:  CBC:  Recent Labs Lab 02/11/16 1235 02/12/16 0238 02/13/16 0606 02/14/16 0230  WBC 10.4 8.1 8.2 7.2  HGB 13.3 11.8* 11.3* 10.4*  HCT 41.7 38.2 36.3 33.0*  MCV 87.8 89.3 89.9 88.5  PLT 229 194 184 176   Basic Metabolic Panel:  Recent Labs Lab 02/11/16 1235 02/11/16 1919 02/12/16 0238 02/13/16 0606 02/14/16 0230  NA 142  --  143 142 139  K 3.6  --  3.4* 3.6 3.5  CL 107  --  108 107 108  CO2 24  --  27 24 24   GLUCOSE 120*  --  102* 113* 103*  BUN 32*  --  31* 25* 28*  CREATININE 0.65  --  0.60 0.61 0.64  CALCIUM 10.4* 10.0 9.4 9.0 8.7*   GFR: Estimated Creatinine Clearance: 40.2 mL/min (by C-G formula based on SCr of 0.64 mg/dL). Liver Function Tests:  Recent Labs Lab 02/11/16 1919  ALBUMIN 4.1   No  results for input(s): LIPASE, AMYLASE in the last 168 hours. No results for input(s): AMMONIA in the last 168 hours. Coagulation Profile:  Recent Labs Lab 02/12/16 0238 02/13/16 0444 02/14/16 0230  INR 1.37 1.40 2.11   Cardiac Enzymes:  Recent Labs Lab 02/11/16 1235 02/12/16 0238  CKTOTAL 923* 673*   BNP (last 3 results) No results for input(s): PROBNP in the last 8760 hours. HbA1C: No results for input(s): HGBA1C in the last 72 hours. CBG:  Recent Labs Lab 02/11/16 1132 02/13/16 2031  GLUCAP 114* 116*   Lipid Profile: No results for input(s): CHOL, HDL, LDLCALC, TRIG, CHOLHDL, LDLDIRECT in the last 72 hours. Thyroid Function Tests: No results for input(s): TSH, T4TOTAL, FREET4, T3FREE, THYROIDAB in the last 72 hours. Anemia Panel: No results for input(s): VITAMINB12, FOLATE, FERRITIN, TIBC, IRON, RETICCTPCT in the last 72 hours. Urine analysis:    Component Value Date/Time   COLORURINE YELLOW 02/13/2016 0514   APPEARANCEUR CLEAR 02/13/2016 0514   LABSPEC 1.019 02/13/2016 0514   PHURINE 6.0 02/13/2016 0514   GLUCOSEU NEGATIVE 02/13/2016 0514   HGBUR NEGATIVE 02/13/2016 0514   BILIRUBINUR NEGATIVE 02/13/2016 0514   KETONESUR 15 (A) 02/13/2016 0514   PROTEINUR 30 (A) 02/13/2016 0514   NITRITE NEGATIVE 02/13/2016 0514   LEUKOCYTESUR NEGATIVE 02/13/2016 0514     RAI,RIPUDEEP M.D. [facility] 02/14/2016, 10:30 AM  Pager: [phone] Between 7am to 7pm - call Pager - [phone]  After 7pm go to [email or web address] - password [email or web address]  Call night coverage person covering after 7pm

## 2016-02-14 NOTE — NC FL2 (Signed)
[place] MEDICAID FL2 LEVEL OF CARE SCREENING TOOL     IDENTIFICATION  Patient Name: Rachel [name] Birthdate: Aug 16, 1930 Sex: female Admission Date (Current Location): 02/11/2016  [facility] and [place] Number:  Producer, television/film/video and Address:  [facility] [place], [address], [place], [place] [address]      Provider Number: [id number]  Attending Physician Name and Address:  Cathren Harsh, MD  Relative Name and Phone Number:       Current Level of Care: Hospital Recommended Level of Care: Skilled Nursing Facility Prior Approval Number:    Date Approved/Denied:   PASRR Number:  [id number])  Discharge Plan: SNF    Current Diagnoses: Patient Active Problem List   Diagnosis Date Noted  . Fall   . Displaced fracture of left femoral neck (HCC) 02/11/2016  . HTN (hypertension) 02/11/2016  . Dehydration, moderate 02/11/2016  . Elevated CPK 02/11/2016    Orientation RESPIRATION BLADDER Height & Weight     Self, Time, Situation, Place  Normal Continent Weight: 112 lb (50.8 kg) Height:  5\' 1"  (154.9 cm)  BEHAVIORAL SYMPTOMS/MOOD NEUROLOGICAL BOWEL NUTRITION STATUS   (none)  (none) Continent Diet (heart)  AMBULATORY STATUS COMMUNICATION OF NEEDS Skin   Extensive Assist Verbally Surgical wounds                       Personal Care Assistance Level of Assistance  Bathing, Feeding, Dressing Bathing Assistance: Limited assistance Feeding assistance: Independent Dressing Assistance: Limited assistance     Functional Limitations Info  Sight, Hearing, Speech Sight Info: Adequate Hearing Info: Adequate Speech Info: Adequate    SPECIAL CARE FACTORS FREQUENCY  PT (By licensed PT)     PT Frequency:  (5x/week)              Contractures Contractures Info: Not present    Additional Factors Info  Code Status, Allergies Code Status Info:  (DNR) Allergies Info:  (Aspirin)           Current Medications (02/14/2016):  This is the  current hospital active medication list Current Facility-Administered Medications  Medication Dose Route Frequency Provider Last Rate Last Dose  . 0.9 %  sodium chloride infusion   Intravenous Continuous Russella Dar, NP   Stopped at 02/13/16 1500  . 0.9 %  sodium chloride infusion   Intravenous Continuous [name], MD   Stopped at 02/13/16 1500  . acetaminophen (TYLENOL) tablet 650 mg  650 mg Oral Q6H PRN [name], MD       Or  . acetaminophen (TYLENOL) suppository 650 mg  650 mg Rectal Q6H PRN [name], MD      . docusate sodium (COLACE) capsule 100 mg  100 mg Oral BID Russella Dar, NP   100 mg at 02/13/16 0952  . enoxaparin (LOVENOX) injection 40 mg  40 mg Subcutaneous Q24H Russella Dar, NP   40 mg at 02/13/16 1705  . hydrALAZINE (APRESOLINE) injection 5-10 mg  5-10 mg Intravenous Q4H PRN Ozella Rocks, MD      . HYDROcodone-acetaminophen (NORCO/VICODIN) 5-325 MG per tablet 1-2 tablet  1-2 tablet Oral Q6H PRN Russella Dar, NP   2 tablet at 02/13/16 1631  . lactated ringers infusion   Intravenous Continuous Val Eagle, MD   Stopped at 02/13/16 1500  . losartan (COZAAR) tablet 100 mg  100 mg Oral Daily Russella Dar, NP   100 mg at 02/13/16 4315  .  menthol-cetylpyridinium (CEPACOL) lozenge 3 mg  1 lozenge Oral PRN Nadara MustardMarcus V Duda, MD       Or  . phenol (CHLORASEPTIC) mouth spray 1 spray  1 spray Mouth/Throat PRN Nadara MustardMarcus V Duda, MD      . metoCLOPramide (REGLAN) tablet 5-10 mg  5-10 mg Oral Q8H PRN Nadara MustardMarcus V Duda, MD       Or  . metoCLOPramide (REGLAN) injection 5-10 mg  5-10 mg Intravenous Q8H PRN Nadara MustardMarcus V Duda, MD      . metoprolol tartrate (LOPRESSOR) tablet 25 mg  25 mg Oral BID Russella DarAllison L Ellis, NP   25 mg at 02/13/16 2212  . morphine 2 MG/ML injection 1-4 mg  1-4 mg Intravenous Q2H PRN Russella DarAllison L Ellis, NP      . ondansetron [facility](ZOFRAN) tablet 4 mg  4 mg Oral Q6H PRN Nadara MustardMarcus V Duda, MD       Or  . ondansetron [facility](ZOFRAN) injection 4 mg  4 mg Intravenous Q6H PRN  Nadara MustardMarcus V Duda, MD      . warfarin (COUMADIN) tablet 2 mg  2 mg Oral ONCE-1800 Hillis RangeEmily A Stewart, RPH      . Warfarin - Pharmacist Dosing Inpatient   Does not apply q1800 Ripudeep Jenna LuoK Rai, MD         Discharge Medications: Please see discharge summary for a list of discharge medications.  Relevant Imaging Results:  Relevant Lab Results:   Additional Information SS#: [id number]  Terald Sleeperakiyah T Natalee Tomkiewicz, LCSW

## 2016-02-14 NOTE — Progress Notes (Signed)
Patient ID: Rachel [name], female   DOB: 11-26-1930, 82 y.o.   MRN: [id number] Postoperative day 2 left hip hemiarthroplasty. Patient is comfortable this morning without complaints. Anticipate discharge to skilled nursing.

## 2016-02-14 NOTE — Clinical Social Work Note (Signed)
Clinical Social Work Assessment  Patient Details  Name: Rachel [name] MRN: [id number] Date of Birth: [birth year]  Date of referral:  02/14/16               Reason for consult:  Discharge Planning                Permission sought to share information with:  Case Manager, Facility Sport and exercise psychologist, Family Supports Permission granted to share information::  Yes, Verbal Permission Granted  Name::        Agency::   (SNF)  Relationship::     Contact Information:     Housing/Transportation Living arrangements for the past 2 months:  Single Family Home Source of Information:  Patient, Medical Team Patient Interpreter Needed:  None Criminal Activity/Legal Involvement Pertinent to Current Situation/Hospitalization:  No - Comment as needed Significant Relationships:  Friend Lives with:  Self Do you feel safe going back to the place where you live?  No Need for family participation in patient care:  Yes (Comment)  Care giving concerns: Pt expressed no care giving concerns at this time. Pt agreeable to SNF for higher level of care.    Social Worker assessment / plan: Holiday representative met with pt and discussed CSW role with discharge planning. CSW also explained PT recommendation for SNF. Pt agreeable to SNF for short-term rehab. Pt also requested [facility] for SNF.   CSW will follow up with bed offers once available.   Employment status:  Retired Forensic scientist:  Medicare PT Recommendations:  [place] / Referral to community resources:  [place]  Patient/Family's Response to care:Pt pleasant and lying in bed. Pt appears to be happy with care she is receiving at [facility].   Patient/Family's Understanding of and Emotional Response to Diagnosis, Current Treatment, and Prognosis:Pt appears to have a good understanding of reason for admission into the hospital and with pt care plan.   Emotional Assessment Appearance:  Appears  stated age, Well-Groomed Attitude/Demeanor/Rapport:   (Pleasant) Affect (typically observed):  Accepting, Appropriate, Pleasant Orientation:  Oriented to Self, Oriented to Place, Oriented to  Time, Oriented to Situation Alcohol / Substance use:  Not Applicable Psych involvement (Current and /or in the community):  No (Comment)  Discharge Needs  Concerns to be addressed:  Discharge Planning Concerns Readmission within the last 30 days:  No Current discharge risk:  None Barriers to Discharge:  Continued Medical Work up   [employer], LCSW 02/14/2016, 11:59 AM

## 2016-02-14 NOTE — Progress Notes (Signed)
Anticoagulant CONSULT NOTE - Follow-Up  Pharmacy Consult for warfarin Indication: VTE prophylaxis  Allergies  Allergen Reactions  . Aspirin     nosebleed    Patient Measurements: Height: 5\' 1"  (154.9 cm) Weight: 112 lb (50.8 kg) IBW/kg (Calculated) : 47.8  Vital Signs: Temp: 98.5 F (36.9 C) (09/17 0553) Temp Source: Oral (09/17 0553) BP: 143/60 (09/17 0553) Pulse Rate: 84 (09/17 0553) Intake/Output from previous day: 09/16 0701 - 09/17 0700 In: 3850.5 [I.V.:3850.5] Out: -  Intake/Output from this shift: No intake/output data recorded.  Labs:  Recent Labs  02/12/16 0238 02/13/16 0606 02/14/16 0230  WBC 8.1 8.2 7.2  HGB 11.8* 11.3* 10.4*  PLT 194 184 176  CREATININE 0.60 0.61 0.64   Estimated Creatinine Clearance: 40.2 mL/min (by C-G formula based on SCr of 0.64 mg/dL). No results for input(s): VANCOTROUGH, VANCOPEAK, VANCORANDOM, GENTTROUGH, GENTPEAK, GENTRANDOM, TOBRATROUGH, TOBRAPEAK, TOBRARND, AMIKACINPEAK, AMIKACINTROU, AMIKACIN in the last 72 hours.   Microbiology: Recent Results (from the past 720 hour(s))  Surgical PCR screen     Status: Abnormal   Collection Time: 02/11/16  8:15 PM  Result Value Ref Range Status   MRSA, PCR NEGATIVE NEGATIVE Final   Staphylococcus aureus POSITIVE (A) NEGATIVE Final    Comment:        The Xpert SA Assay (FDA approved for NASAL specimens in patients over 17 years of age), is one component of a comprehensive surveillance program.  Test performance has been validated by [facility] for patients greater than or equal to 64 year old. It is not intended to diagnose infection nor to guide or monitor treatment.     Medical History: Past Medical History:  Diagnosis Date  . Arthritis    OA  . Fracture of femoral neck (HCC) 02/11/2016   LEFT  . Hypertension     Medications:  Prescriptions Prior to Admission  Medication Sig Dispense Refill Last Dose  . acetaminophen (TYLENOL) 500 MG tablet Take 500 mg by mouth  every 6 (six) hours as needed for moderate pain.   02/10/2016 at Unknown time  . losartan (COZAAR) 100 MG tablet Take 100 mg by mouth daily.   02/10/2016 at Unknown time  . metoprolol tartrate (LOPRESSOR) 25 MG tablet Take 25 mg by mouth 2 (two) times daily.   02/10/2016 at 0900   Assessment: 82 year old Rachel [name] who presents with left hip fracture. Pharmacy has been consulted to dose warfarin. Patient was not taking anticoagulants prior to admission. Baseline INR was 1.37. INR this AM is 2.11 after warfarin 4 mg then 3 mg. Patient's hemoglobin is low but stable and plts WNL. No signs of bleeding noted in chart. Patient is also on enoxaparin 40 mg every 24 hours with a CrCl ~40.  I will give her 2 mg tonight due to age, weight, and INR trend.  Goal of Therapy:  INR 2-3 Monitor platelets by anticoagulation protocol: Yes  Plan:  Warfarin 2mg  once Daily INR and CBC Monitor for s/sx of bleeding Enoxaparin until INR is therapeutic > 24 hours. Monitor renal function.  Bailey Mech, Ilda Basset D Pharmacy Resident Pager: [phone] 02/14/2016 9:22 AM

## 2016-02-15 ENCOUNTER — Encounter (HOSPITAL_COMMUNITY): Payer: Self-pay | Admitting: Orthopedic Surgery

## 2016-02-15 LAB — CBC
HCT: 34.2 % — ABNORMAL LOW (ref 36.0–46.0)
Hemoglobin: 10.6 g/dL — ABNORMAL LOW (ref 12.0–15.0)
MCH: 27.5 pg (ref 26.0–34.0)
MCHC: 31 g/dL (ref 30.0–36.0)
MCV: 88.8 fL (ref 78.0–100.0)
Platelets: 194 10*3/uL (ref 150–400)
RBC: 3.85 MIL/uL — ABNORMAL LOW (ref 3.87–5.11)
RDW: 13.5 % (ref 11.5–15.5)
WBC: 6.5 10*3/uL (ref 4.0–10.5)

## 2016-02-15 LAB — PROTIME-INR
INR: 2.14
Prothrombin Time: 24.3 seconds — ABNORMAL HIGH (ref 11.4–15.2)

## 2016-02-15 MED ORDER — POLYETHYLENE GLYCOL 3350 17 G PO PACK: 17.0000 g | PACK | Freq: Every day | ORAL | 0 refills | Status: DC | PRN

## 2016-02-15 MED ORDER — POLYETHYLENE GLYCOL 3350 17 G PO PACK: 17.0000 g | PACK | Freq: Every day | ORAL | 0 refills | Status: DC

## 2016-02-15 MED ORDER — SENNOSIDES-DOCUSATE SODIUM 8.6-50 MG PO TABS: 1.0000 | ORAL_TABLET | Freq: Two times a day (BID) | ORAL | Status: DC

## 2016-02-15 NOTE — Clinical Social Work Placement (Signed)
   CLINICAL SOCIAL WORK PLACEMENT  NOTE  Date:  02/15/2016  Patient Details  Name: Rachel [name] MRN: [id number] Date of Birth: [birth year]  Clinical Social Work is seeking post-discharge placement for this patient at the Skilled  Nursing Facility level of care (*CSW will initial, date and re-position this form in  chart as items are completed):  Yes   Patient/family provided with [facility] Clinical Social Work Department's list of facilities offering this level of care within the geographic area requested by the patient (or if unable, by the patient's family).  Yes   Patient/family informed of their freedom to choose among providers that offer the needed level of care, that participate in Medicare, Medicaid or managed care program needed by the patient, have an available bed and are willing to accept the patient.  Yes   Patient/family informed of [facility] ownership interest in [facility] and [facility], as well as of the fact that they are under no obligation to receive care at these facilities.  PASRR submitted to EDS on 02/14/16     PASRR number received on 02/14/16     Existing PASRR number confirmed on       FL2 transmitted to all facilities in geographic area requested by pt/family on 02/14/16     FL2 transmitted to all facilities within larger geographic area on       Patient informed that his/her managed care company has contracts with or will negotiate with certain facilities, including the following:        Yes   Patient/family informed of bed offers received.  Patient chooses bed at [facility] [place]     Physician recommends and patient chooses bed at      Patient to be transferred to [facility] [place] on 02/15/16.  Patient to be transferred to facility by [facility]     Patient family notified on 02/15/16 of transfer.  Name of family member notified:  Patsy friend updated via phone     PHYSICIAN Please prepare priority discharge summary,  including medications, Please prepare prescriptions, Please sign FL2, Please sign DNR     Additional Comment:    _______________________________________________ Rondel Baton, LCSW 02/15/2016, 12:54 PM

## 2016-02-15 NOTE — Progress Notes (Signed)
Called [facility] and gave report to nurse at facility. IV was removed and belongings were gathered and placed in bag.  Pt will be transported by [facility] , ambulance.  Pt is ready for discharge. No concerns voiced by pt at this time .   Leonia Reeves, RN

## 2016-02-15 NOTE — Progress Notes (Signed)
PT Cancellation Note  Patient Details Name: Rachel [name] MRN: [id number] DOB: 05-26-1931   Cancelled Treatment:    Reason Eval/Treat Not Completed: Patient declined, no reason specified.  Pt adamantly refusing any PT or mobility at this time stating it is "just too much" and wants to wait until she goes to SNF for further therapies.  Will f/u another time.     Sunny Schlein, [place] [phone] 02/15/2016, 8:34 AM

## 2016-02-15 NOTE — Care Management Important Message (Signed)
Important Message  Patient Details  Name: Rachel [name] MRN: [id number] Date of Birth: 12/08/1930   Medicare Important Message Given:  Yes    Yanai Hobson 02/15/2016, 11:14 AM

## 2016-02-15 NOTE — Discharge Summary (Signed)
Physician Discharge Summary   Patient ID: Rachel [name] MRN: [id number] DOB/AGE: 80/12/1930 82 y.o.  Admit date: 02/11/2016 Discharge date: 02/15/2016  Primary Care Physician:  Rachel ApoPOLITE,RONALD D, MD  Discharge Diagnoses:    . Displaced fracture of left femoral neck (HCC) . HTN (hypertension) . Dehydration, moderate . Elevated CPK  constipation   Consults:  Orthopedics   Recommendations for Outpatient Follow-up:  1. Continue weightbearing as tolerated 2. Please repeat CBC/BMET at next visit  DIET: Heart healthy diet    Allergies:   Allergies  Allergen Reactions  . Aspirin     nosebleed     DISCHARGE MEDICATIONS: Current Discharge Medication List    START taking these medications   Details  polyethylene glycol (MIRALAX) packet Take 17 g by mouth daily as needed for moderate constipation. Qty: 14 each, Refills: 0    senna-docusate (SENOKOT S) 8.6-50 MG tablet Take 1 tablet by mouth 2 (two) times daily. For constipation      CONTINUE these medications which have CHANGED   Details  acetaminophen (TYLENOL) 500 MG tablet Take 1 tablet (500 mg total) by mouth every 6 (six) hours as needed for mild pain. Qty: 30 tablet, Refills: 0      CONTINUE these medications which have NOT CHANGED   Details  losartan (COZAAR) 100 MG tablet Take 100 mg by mouth daily.    metoprolol tartrate (LOPRESSOR) 25 MG tablet Take 25 mg by mouth 2 (two) times daily.         Brief H and P: For complete details please refer to admission H and P, but in brief Rachel GravelShirley M Allenis a 82 y.o.femalewith medical history significant for eye protection and osteoarthritis. Patient reports about 2 weeks ago she fell on her back after slipping in her car port. She had some left hip pain and significant bruising but because she was able to walk on the leg did not seek medical attention. Over the past several days she has noticed increasing pain in the left hip and unfortunately fell again this morning.  Evaluation in the ER revealed a displaced left femoral neck fracture.   Hospital Course:  Displaced fracture of left femoral neck -Status post multiple falls in the past 2 weeks, x-ray showed displaced fracture of the left femoral neck.  - Orthopedics was consulted, patient underwent left hip hemiarthroplasty, postop day # 3 - Pain control with tylenol  - PT recommended skilled nursing facility - I spoke with Dr. Lajoyce Cornersuda, recommended no coumadin for DVT prophylaxis with her age and possibility of bleeding or fall. Patient has allergy to aspirin. Dr. Lajoyce Cornersuda recommended no Coumadin or aspirin or Lovenox at this time for discharge.   Dehydration, moderate/Elevated CPK -CK 923 on admission, with calcium 10.4 on admission, now improving   HTN (hypertension) - stable, Continue Lopressor, Cozaar  Constipation Placed on bowel regimen with MiraLAX and Senokot-S  Day of Discharge BP (!) 127/55   Pulse 86   Temp 98 F (36.7 C) (Oral)   Resp 20   Ht 5\' 1"  (1.549 m)   Wt 50.8 kg (112 lb)   SpO2 98%   BMI 21.16 kg/m   Physical Exam: General: Alert and awake oriented x3 not in any acute distress. HEENT: anicteric sclera, pupils reactive to light and accommodation CVS: S1-S2 clear no murmur rubs or gallops Chest: clear to auscultation bilaterally, no wheezing rales or rhonchi Abdomen: soft nontender, nondistended, normal bowel sounds Extremities: no cyanosis, clubbing or edema noted bilaterally Neuro: Cranial nerves II-XII  intact, no focal neurological deficits   The results of significant diagnostics from this hospitalization (including imaging, microbiology, ancillary and laboratory) are listed below for reference.    LAB RESULTS: Basic Metabolic Panel:  Recent Labs Lab 02/13/16 0606 02/14/16 0230  NA 142 139  K 3.6 3.5  CL 107 108  CO2 24 24  GLUCOSE 113* 103*  BUN 25* 28*  CREATININE 0.61 0.64  CALCIUM 9.0 8.7*   Liver Function Tests:  Recent Labs Lab  02/11/16 1919  ALBUMIN 4.1   No results for input(s): LIPASE, AMYLASE in the last 168 hours. No results for input(s): AMMONIA in the last 168 hours. CBC:  Recent Labs Lab 02/14/16 0230 02/15/16 0332  WBC 7.2 6.5  HGB 10.4* 10.6*  HCT 33.0* 34.2*  MCV 88.5 88.8  PLT 176 194   Cardiac Enzymes:  Recent Labs Lab 02/11/16 1235 02/12/16 0238  CKTOTAL 923* 673*   BNP: Invalid input(s): POCBNP CBG:  Recent Labs Lab 02/11/16 1132 02/13/16 2031  GLUCAP 114* 116*    Significant Diagnostic Studies:  Dg Chest 1 View  Result Date: 02/11/2016 CLINICAL DATA:  Multiple falls. EXAM: CHEST 1 VIEW COMPARISON:  03/22/2010. FINDINGS: Normal sized heart. Clear lungs. Multiple calcified mediastinal and left hilar lymph nodes. Calcified granuloma in the medial aspect of the left lower lobe. Otherwise, clear lungs. Diffuse osteopenia. IMPRESSION: No acute abnormality.  Changes of previous granulomatous infection. Electronically Signed   By: Beckie Salts M.[name].   On: 02/11/2016 13:42   Dg Elbow Complete Right  Result Date: 02/11/2016 CLINICAL DATA:  Larey Seat.  Right elbow pain. EXAM: RIGHT ELBOW - COMPLETE 3+ VIEW COMPARISON:  None. FINDINGS: Moderate degenerative changes and chondrocalcinosis. No acute fracture or joint effusion. IMPRESSION: No acute fracture or joint effusion. Degenerative changes and chondrocalcinosis. Electronically Signed   By: Rudie Meyer M.[name].   On: 02/11/2016 13:39   Dg Ankle Complete Right  Result Date: 02/11/2016 CLINICAL DATA:  Right ankle pain and swelling following multiple falls. EXAM: RIGHT ANKLE - COMPLETE 3+ VIEW COMPARISON:  None. FINDINGS: Tendon, ligament and fascia calcifications. No fracture, dislocation or effusion seen. Calcaneal spurs. IMPRESSION: 1. No fracture or dislocation. 2. Tendon, ligament and fascia calcifications. Electronically Signed   By: Beckie Salts M.[name].   On: 02/11/2016 13:44   Ct Head Wo Contrast  Result Date: 02/11/2016 CLINICAL DATA:   Status post trip and fall with a blow to the back of the head while walking last night. Initial encounter. EXAM: CT HEAD WITHOUT CONTRAST CT CERVICAL SPINE WITHOUT CONTRAST TECHNIQUE: Multidetector CT imaging of the head and cervical spine was performed following the standard protocol without intravenous contrast. Multiplanar CT image reconstructions of the cervical spine were also generated. COMPARISON:  None. FINDINGS: CT HEAD FINDINGS Brain: The brain is atrophic with chronic microvascular ischemic change. No acute abnormality including hemorrhage, infarct, mass lesion, mass effect, midline shift or abnormal extra-axial fluid collection. No hydrocephalus or pneumocephalus. Vascular: Unremarkable. Skull: Intact. Sinuses/Orbits: The patient is status post right maxillary antrostomy. The right maxillary sinus appears atrophic with a thickened wall. Otherwise unremarkable. Other: None. CT CERVICAL SPINE FINDINGS Alignment: Maintained. Skull base and vertebrae: No fracture is identified. Degenerative change at the articulations of the lateral masses of C1 and occipital condyles is seen, more notable on the right. Soft tissues and spinal canal: Unremarkable. Disc levels: Loss of disc space height is noted at C6-7. The C4-5 disc is largely calcified. Marked multilevel facet arthropathy is seen. The C4-5 and C5-6  facet joints are ankylosed bilaterally. Upper chest: Lung apices are clear. Other: None. IMPRESSION: No acute abnormality head or cervical spine. Cortical atrophy and chronic microvascular ischemic change. Cervical spondylosis. Electronically Signed   By: Drusilla Kanner M.[name].   On: 02/11/2016 13:02   Ct Cervical Spine Wo Contrast  Result Date: 02/11/2016 CLINICAL DATA:  Status post trip and fall with a blow to the back of the head while walking last night. Initial encounter. EXAM: CT HEAD WITHOUT CONTRAST CT CERVICAL SPINE WITHOUT CONTRAST TECHNIQUE: Multidetector CT imaging of the head and cervical spine  was performed following the standard protocol without intravenous contrast. Multiplanar CT image reconstructions of the cervical spine were also generated. COMPARISON:  None. FINDINGS: CT HEAD FINDINGS Brain: The brain is atrophic with chronic microvascular ischemic change. No acute abnormality including hemorrhage, infarct, mass lesion, mass effect, midline shift or abnormal extra-axial fluid collection. No hydrocephalus or pneumocephalus. Vascular: Unremarkable. Skull: Intact. Sinuses/Orbits: The patient is status post right maxillary antrostomy. The right maxillary sinus appears atrophic with a thickened wall. Otherwise unremarkable. Other: None. CT CERVICAL SPINE FINDINGS Alignment: Maintained. Skull base and vertebrae: No fracture is identified. Degenerative change at the articulations of the lateral masses of C1 and occipital condyles is seen, more notable on the right. Soft tissues and spinal canal: Unremarkable. Disc levels: Loss of disc space height is noted at C6-7. The C4-5 disc is largely calcified. Marked multilevel facet arthropathy is seen. The C4-5 and C5-6 facet joints are ankylosed bilaterally. Upper chest: Lung apices are clear. Other: None. IMPRESSION: No acute abnormality head or cervical spine. Cortical atrophy and chronic microvascular ischemic change. Cervical spondylosis. Electronically Signed   By: Drusilla Kanner M.[name].   On: 02/11/2016 13:02   Dg Foot Complete Right  Result Date: 02/11/2016 CLINICAL DATA:  Larey Seat.  Right foot pain. EXAM: RIGHT FOOT COMPLETE - 3+ VIEW COMPARISON:  None. FINDINGS: Moderate osteoporosis. Moderate degenerative changes. No acute fractures identified. Small calcaneal heel spur and calcifications in the plantar fascia. IMPRESSION: No acute fracture. Electronically Signed   By: Rudie Meyer M.[name].   On: 02/11/2016 13:40   Dg Hip Unilat With Pelvis 2-3 Views Left  Result Date: 02/11/2016 CLINICAL DATA:  Larey Seat. EXAM: DG HIP (WITH OR WITHOUT PELVIS) 2-3V LEFT  COMPARISON:  None. FINDINGS: There is a displaced left femoral neck fracture. The right hip is intact. The pubic symphysis and SI joints are intact. No pelvic fractures or bone lesions. Vascular calcifications are noted. IMPRESSION: Displaced left femoral neck fracture. Electronically Signed   By: Rudie Meyer M.[name].   On: 02/11/2016 13:41    2D ECHO:   Disposition and Follow-up: Discharge Instructions    Diet - low sodium heart healthy    Complete by:  As directed    Increase activity slowly    Complete by:  As directed    Weight bearing as tolerated    Complete by:  As directed    Laterality:  left   Extremity:  Lower       DISPOSITION:  SNF    DISCHARGE FOLLOW-UP Follow-up Information    [name], MD Follow up in 2 week(s).   Specialty:  Orthopedic Surgery Contact information: [address] [place] [address] [phone]        Rachel Apo, MD. Schedule an appointment as soon as possible for a visit in 2 week(s).   Specialty:  Internal Medicine Contact information: [address] [place] [address] [phone]  Time spent on Discharge:   Signed:   Mitchell Epling M.[name]. [facility] 02/15/2016, 11:13 AM Pager: [phone]

## 2016-02-15 NOTE — Clinical Social Work Note (Signed)
Patient will discharge today per MD order. Patient will discharge to: [facility] RN to call report prior to transportation [phone] room 808-A Transportation: [facility]  CSW sent discharge summary to SNF for review.    Vickii Penna, MSW, LCSW  [phone]  Licensed Clinical Social Worker

## 2017-02-08 ENCOUNTER — Encounter (HOSPITAL_COMMUNITY): Payer: Self-pay

## 2017-02-08 ENCOUNTER — Inpatient Hospital Stay (HOSPITAL_COMMUNITY)
Admission: EM | Admit: 2017-02-08 | Discharge: 2017-02-20 | DRG: 291 | Disposition: A | Discharge status: Skilled Nursing Facility | Payer: Medicare Other | Attending: Internal Medicine | Admitting: Internal Medicine

## 2017-02-08 ENCOUNTER — Emergency Department (HOSPITAL_COMMUNITY): Payer: Medicare Other

## 2017-02-08 DIAGNOSIS — D751 Secondary polycythemia: Secondary | ICD-10-CM | POA: Diagnosis present

## 2017-02-08 DIAGNOSIS — R4701 Aphasia: Secondary | ICD-10-CM | POA: Diagnosis not present

## 2017-02-08 DIAGNOSIS — I509 Heart failure, unspecified: Secondary | ICD-10-CM | POA: Diagnosis not present

## 2017-02-08 DIAGNOSIS — I4891 Unspecified atrial fibrillation: Secondary | ICD-10-CM | POA: Diagnosis not present

## 2017-02-08 DIAGNOSIS — J90 Pleural effusion, not elsewhere classified: Secondary | ICD-10-CM

## 2017-02-08 DIAGNOSIS — M1990 Unspecified osteoarthritis, unspecified site: Secondary | ICD-10-CM | POA: Diagnosis present

## 2017-02-08 DIAGNOSIS — R339 Retention of urine, unspecified: Secondary | ICD-10-CM | POA: Diagnosis present

## 2017-02-08 DIAGNOSIS — R7303 Prediabetes: Secondary | ICD-10-CM | POA: Diagnosis present

## 2017-02-08 DIAGNOSIS — Z66 Do not resuscitate: Secondary | ICD-10-CM | POA: Diagnosis present

## 2017-02-08 DIAGNOSIS — I428 Other cardiomyopathies: Secondary | ICD-10-CM | POA: Diagnosis present

## 2017-02-08 DIAGNOSIS — I472 Ventricular tachycardia: Secondary | ICD-10-CM | POA: Diagnosis not present

## 2017-02-08 DIAGNOSIS — I248 Other forms of acute ischemic heart disease: Secondary | ICD-10-CM | POA: Diagnosis present

## 2017-02-08 DIAGNOSIS — I481 Persistent atrial fibrillation: Secondary | ICD-10-CM | POA: Diagnosis not present

## 2017-02-08 DIAGNOSIS — E785 Hyperlipidemia, unspecified: Secondary | ICD-10-CM | POA: Diagnosis present

## 2017-02-08 DIAGNOSIS — R29713 NIHSS score 13: Secondary | ICD-10-CM | POA: Diagnosis not present

## 2017-02-08 DIAGNOSIS — D696 Thrombocytopenia, unspecified: Secondary | ICD-10-CM | POA: Diagnosis present

## 2017-02-08 DIAGNOSIS — Z6821 Body mass index (BMI) 21.0-21.9, adult: Secondary | ICD-10-CM | POA: Diagnosis not present

## 2017-02-08 DIAGNOSIS — E440 Moderate protein-calorie malnutrition: Secondary | ICD-10-CM | POA: Insufficient documentation

## 2017-02-08 DIAGNOSIS — I130 Hypertensive heart and chronic kidney disease with heart failure and stage 1 through stage 4 chronic kidney disease, or unspecified chronic kidney disease: Secondary | ICD-10-CM | POA: Diagnosis present

## 2017-02-08 DIAGNOSIS — I6340 Cerebral infarction due to embolism of unspecified cerebral artery: Secondary | ICD-10-CM

## 2017-02-08 DIAGNOSIS — I255 Ischemic cardiomyopathy: Secondary | ICD-10-CM | POA: Diagnosis present

## 2017-02-08 DIAGNOSIS — Z79899 Other long term (current) drug therapy: Secondary | ICD-10-CM | POA: Diagnosis not present

## 2017-02-08 DIAGNOSIS — I5041 Acute combined systolic (congestive) and diastolic (congestive) heart failure: Secondary | ICD-10-CM | POA: Diagnosis present

## 2017-02-08 DIAGNOSIS — I739 Peripheral vascular disease, unspecified: Secondary | ICD-10-CM | POA: Diagnosis present

## 2017-02-08 DIAGNOSIS — I639 Cerebral infarction, unspecified: Secondary | ICD-10-CM | POA: Diagnosis not present

## 2017-02-08 DIAGNOSIS — I10 Essential (primary) hypertension: Secondary | ICD-10-CM | POA: Diagnosis not present

## 2017-02-08 DIAGNOSIS — N183 Chronic kidney disease, stage 3 (moderate): Secondary | ICD-10-CM | POA: Diagnosis present

## 2017-02-08 DIAGNOSIS — R531 Weakness: Secondary | ICD-10-CM

## 2017-02-08 DIAGNOSIS — L8990 Pressure ulcer of unspecified site, unspecified stage: Secondary | ICD-10-CM | POA: Insufficient documentation

## 2017-02-08 DIAGNOSIS — E876 Hypokalemia: Secondary | ICD-10-CM | POA: Diagnosis not present

## 2017-02-08 DIAGNOSIS — I214 Non-ST elevation (NSTEMI) myocardial infarction: Secondary | ICD-10-CM | POA: Diagnosis present

## 2017-02-08 DIAGNOSIS — J181 Lobar pneumonia, unspecified organism: Secondary | ICD-10-CM | POA: Diagnosis present

## 2017-02-08 DIAGNOSIS — N179 Acute kidney failure, unspecified: Secondary | ICD-10-CM | POA: Diagnosis present

## 2017-02-08 DIAGNOSIS — I513 Intracardiac thrombosis, not elsewhere classified: Secondary | ICD-10-CM | POA: Diagnosis not present

## 2017-02-08 DIAGNOSIS — G8191 Hemiplegia, unspecified affecting right dominant side: Secondary | ICD-10-CM | POA: Diagnosis not present

## 2017-02-08 DIAGNOSIS — R2990 Unspecified symptoms and signs involving the nervous system: Secondary | ICD-10-CM | POA: Diagnosis not present

## 2017-02-08 DIAGNOSIS — N289 Disorder of kidney and ureter, unspecified: Secondary | ICD-10-CM | POA: Diagnosis not present

## 2017-02-08 DIAGNOSIS — J189 Pneumonia, unspecified organism: Secondary | ICD-10-CM | POA: Diagnosis present

## 2017-02-08 DIAGNOSIS — I351 Nonrheumatic aortic (valve) insufficiency: Secondary | ICD-10-CM | POA: Diagnosis present

## 2017-02-08 DIAGNOSIS — Z96642 Presence of left artificial hip joint: Secondary | ICD-10-CM | POA: Diagnosis present

## 2017-02-08 DIAGNOSIS — I63423 Cerebral infarction due to embolism of bilateral anterior cerebral arteries: Secondary | ICD-10-CM | POA: Diagnosis not present

## 2017-02-08 LAB — HEPATIC FUNCTION PANEL
ALK PHOS: 80 U/L (ref 38–126)
ALT: 28 U/L (ref 14–54)
AST: 45 U/L — ABNORMAL HIGH (ref 15–41)
Albumin: 3.7 g/dL (ref 3.5–5.0)
BILIRUBIN DIRECT: 0.4 mg/dL (ref 0.1–0.5)
BILIRUBIN INDIRECT: 0.8 mg/dL (ref 0.3–0.9)
BILIRUBIN TOTAL: 1.2 mg/dL (ref 0.3–1.2)
Total Protein: 5.7 g/dL — ABNORMAL LOW (ref 6.5–8.1)

## 2017-02-08 LAB — I-STAT TROPONIN, ED: Troponin i, poc: 0.25 ng/mL (ref 0.00–0.08)

## 2017-02-08 LAB — BASIC METABOLIC PANEL
ANION GAP: 11 (ref 5–15)
BUN: 62 mg/dL — ABNORMAL HIGH (ref 6–20)
CALCIUM: 9.8 mg/dL (ref 8.9–10.3)
CO2: 27 mmol/L (ref 22–32)
CREATININE: 1.23 mg/dL — AB (ref 0.44–1.00)
Chloride: 107 mmol/L (ref 101–111)
GFR calc Af Amer: 45 mL/min — ABNORMAL LOW (ref >60)
GFR, EST NON AFRICAN AMERICAN: 39 mL/min — AB (ref >60)
GLUCOSE: 110 mg/dL — AB (ref 65–99)
Potassium: 3.8 mmol/L (ref 3.5–5.1)
Sodium: 145 mmol/L (ref 135–145)

## 2017-02-08 LAB — CBC
HCT: 52.8 % — ABNORMAL HIGH (ref 36.0–46.0)
HEMOGLOBIN: 17.5 g/dL — AB (ref 12.0–15.0)
MCH: 27 pg (ref 26.0–34.0)
MCHC: 33.1 g/dL (ref 30.0–36.0)
MCV: 81.4 fL (ref 78.0–100.0)
Platelets: 120 10*3/uL — ABNORMAL LOW (ref 150–400)
RBC: 6.49 MIL/uL — ABNORMAL HIGH (ref 3.87–5.11)
RDW: 17.1 % — ABNORMAL HIGH (ref 11.5–15.5)
WBC: 12.1 10*3/uL — ABNORMAL HIGH (ref 4.0–10.5)

## 2017-02-08 LAB — MAGNESIUM: Magnesium: 1.8 mg/dL (ref 1.7–2.4)

## 2017-02-08 LAB — CBG MONITORING, ED: GLUCOSE-CAPILLARY: 103 mg/dL — AB (ref 65–99)

## 2017-02-08 LAB — BRAIN NATRIURETIC PEPTIDE: B Natriuretic Peptide: 3637 pg/mL — ABNORMAL HIGH (ref 0.0–100.0)

## 2017-02-08 MED ORDER — DILTIAZEM LOAD VIA INFUSION
10.0000 mg | Freq: Once | INTRAVENOUS | Status: AC
  Administered 2017-02-08: 10 mg via INTRAVENOUS
  Filled 2017-02-08: qty 10

## 2017-02-08 MED ORDER — CEFTRIAXONE SODIUM 1 G IJ SOLR
1.0000 g | Freq: Every day | INTRAMUSCULAR | Status: DC
  Administered 2017-02-09 – 2017-02-10 (×2): 1 g via INTRAVENOUS
  Filled 2017-02-08 (×3): qty 10

## 2017-02-08 MED ORDER — ASPIRIN 81 MG PO CHEW
324.0000 mg | CHEWABLE_TABLET | Freq: Once | ORAL | Status: AC
  Administered 2017-02-08: 324 mg via ORAL
  Filled 2017-02-08: qty 4

## 2017-02-08 MED ORDER — ASPIRIN EC 81 MG PO TBEC
81.0000 mg | DELAYED_RELEASE_TABLET | Freq: Every day | ORAL | Status: DC
  Administered 2017-02-09: 81 mg via ORAL
  Filled 2017-02-08: qty 1

## 2017-02-08 MED ORDER — NITROGLYCERIN 0.4 MG SL SUBL: 0.4000 mg | SUBLINGUAL_TABLET | SUBLINGUAL | Status: DC | PRN

## 2017-02-08 MED ORDER — FUROSEMIDE 10 MG/ML IJ SOLN
40.0000 mg | Freq: Once | INTRAMUSCULAR | Status: AC
  Administered 2017-02-09: 40 mg via INTRAVENOUS
  Filled 2017-02-08 (×2): qty 4

## 2017-02-08 MED ORDER — FUROSEMIDE 10 MG/ML IJ SOLN
40.0000 mg | Freq: Two times a day (BID) | INTRAMUSCULAR | Status: DC
  Administered 2017-02-09 – 2017-02-12 (×7): 40 mg via INTRAVENOUS
  Filled 2017-02-08 (×7): qty 4

## 2017-02-08 MED ORDER — ATORVASTATIN CALCIUM 40 MG PO TABS
40.0000 mg | ORAL_TABLET | Freq: Every day | ORAL | Status: DC
  Administered 2017-02-09 – 2017-02-20 (×10): 40 mg via ORAL
  Filled 2017-02-08 (×4): qty 1
  Filled 2017-02-08: qty 4
  Filled 2017-02-08 (×5): qty 1

## 2017-02-08 MED ORDER — DEXTROSE 5 % IV SOLN
500.0000 mg | Freq: Once | INTRAVENOUS | Status: AC
  Administered 2017-02-09: 500 mg via INTRAVENOUS
  Filled 2017-02-08: qty 500

## 2017-02-08 MED ORDER — HEPARIN (PORCINE) IN NACL 100-0.45 UNIT/ML-% IJ SOLN
600.0000 [IU]/h | INTRAMUSCULAR | Status: DC
  Administered 2017-02-09: 600 [IU]/h via INTRAVENOUS
  Filled 2017-02-08: qty 250

## 2017-02-08 MED ORDER — DILTIAZEM HCL-DEXTROSE 100-5 MG/100ML-% IV SOLN (PREMIX)
5.0000 mg/h | INTRAVENOUS | Status: DC
  Administered 2017-02-08: 5 mg/h via INTRAVENOUS
  Filled 2017-02-08: qty 100

## 2017-02-08 MED ORDER — DEXTROSE 5 % IV SOLN
500.0000 mg | INTRAVENOUS | Status: DC
  Administered 2017-02-09: 500 mg via INTRAVENOUS
  Filled 2017-02-08: qty 500

## 2017-02-08 MED ORDER — DEXTROSE 5 % IV SOLN
1.0000 g | Freq: Once | INTRAVENOUS | Status: AC
  Administered 2017-02-09: 1 g via INTRAVENOUS
  Filled 2017-02-08: qty 10

## 2017-02-08 MED ORDER — HEPARIN BOLUS VIA INFUSION
1800.0000 [IU] | Freq: Once | INTRAVENOUS | Status: AC
  Administered 2017-02-09: 1800 [IU] via INTRAVENOUS
  Filled 2017-02-08: qty 1800

## 2017-02-08 MED ORDER — ONDANSETRON HCL 4 MG/2ML IJ SOLN: 4.0000 mg | Freq: Four times a day (QID) | INTRAMUSCULAR | Status: DC | PRN

## 2017-02-08 MED ORDER — ACETAMINOPHEN 325 MG PO TABS: 650.0000 mg | ORAL_TABLET | ORAL | Status: DC | PRN

## 2017-02-08 MED ORDER — METOPROLOL TARTRATE 25 MG PO TABS
25.0000 mg | ORAL_TABLET | Freq: Two times a day (BID) | ORAL | Status: DC
  Administered 2017-02-08 – 2017-02-09 (×2): 25 mg via ORAL
  Filled 2017-02-08 (×2): qty 1

## 2017-02-08 NOTE — ED Provider Notes (Addendum)
[facility]-EMERGENCY DEPT Provider Note   CSN: [id number] Arrival date & time: 02/08/17  1613     History   Chief Complaint Chief Complaint  Patient presents with  . generalized weakness  . poor po intake  . non compliant with medications    HPI Rachel [name] is a 83 y.o. female.  HPI   Rachel [name] is a 83 y.o. female, with a history of HTN, presenting to the ED with increased weakness. States she sat down on the toilet today and was too weak to stand up. She endorses increasing generalized weakness, fatigue, shortness of breath, and lower extremity edema. She also notes that she has diarrhea after many of her meals now.   Denies dizziness, CP, abdominal pain, N/V, hematochezia/melena, palpitations, fever/chills, cough, falls/trauma, or any other complaints.    Past Medical History:  Diagnosis Date  . Arthritis    OA  . Fracture of femoral neck (HCC) 02/11/2016   LEFT  . Hypertension     Patient Active Problem List   Diagnosis Date Noted  . Fall   . Displaced fracture of left femoral neck (HCC) 02/11/2016  . HTN (hypertension) 02/11/2016  . Dehydration, moderate 02/11/2016  . Elevated CPK 02/11/2016    Past Surgical History:  Procedure Laterality Date  . APPENDECTOMY    . BREAST SURGERY     BIOPSY  . HIP ARTHROPLASTY Left 02/12/2016   Procedure: ARTHROPLASTY BIPOLAR HIP (HEMIARTHROPLASTY);  Surgeon: [name], MD;  Location: [facility] OR;  Service: Orthopedics;  Laterality: Left;    OB History    No data available       Home Medications    Prior to Admission medications   Medication Sig Start Date End Date Taking? Authorizing Provider  acetaminophen (TYLENOL) 500 MG tablet Take 1 tablet (500 mg total) by mouth every 6 (six) hours as needed for mild pain. 02/12/16  Yes [name], MD  losartan (COZAAR) 100 MG tablet Take 100 mg by mouth daily.   Yes [provider]  metoprolol tartrate (LOPRESSOR) 25 MG tablet Take 25 mg by mouth 2 (two) times  daily.   Yes [provider]  polyethylene glycol (MIRALAX) packet Take 17 g by mouth daily as needed for moderate constipation. 02/15/16   Rai, Ripudeep K, MD  senna-docusate (SENOKOT S) 8.6-50 MG tablet Take 1 tablet by mouth 2 (two) times daily. For constipation 02/15/16   Cathren Harsh, MD    Family History Family History  Problem Relation Age of Onset  . Family history unknown: Yes    Social History Social History  Substance Use Topics  . Smoking status: Never Smoker  . Smokeless tobacco: Never Used  . Alcohol use No     Allergies   Aspirin   Review of Systems Review of Systems  Constitutional: Negative for chills, diaphoresis and fever.  Respiratory: Positive for shortness of breath. Negative for cough.   Cardiovascular: Positive for leg swelling. Negative for chest pain.  Gastrointestinal: Positive for diarrhea. Negative for abdominal pain, blood in stool, nausea and vomiting.  Genitourinary: Negative for dysuria, frequency and hematuria.  Musculoskeletal: Negative for back pain and neck pain.  Skin: Negative for rash.  Neurological: Positive for weakness (generalized). Negative for dizziness, syncope, light-headedness, numbness and headaches.  All other systems reviewed and are negative.    Physical Exam Updated Vital Signs BP (!) 142/97 (BP Location: Right Arm)   Pulse (!) 103   Temp 98 F (36.7 C) (Oral)  Resp 18   Ht 5' (1.524 m)   Wt 50.8 kg (112 lb)   SpO2 98%   BMI 21.87 kg/m   Physical Exam  Constitutional: She is oriented to person, place, and time. She appears well-developed and well-nourished. No distress.  HENT:  Head: Normocephalic and atraumatic.  Mouth/Throat: Mucous membranes are dry.  Eyes: Conjunctivae are normal.  Neck: Neck supple.  Cardiovascular: Normal heart sounds and intact distal pulses.  An irregularly irregular rhythm present. Tachycardia present.   Pulmonary/Chest: Tachypnea noted. She has decreased breath  sounds.  Noted to be orthopneic.   Abdominal: Soft. There is no tenderness. There is no guarding.  Musculoskeletal: She exhibits edema. She exhibits no tenderness.  Pitting edema extending to the upper thighs and equal bilaterally.   Full ROM in the bilateral hips, knees, and ankles. Full ROM in the shoulders, elbows, and wrists.   Lymphadenopathy:    She has no cervical adenopathy.  Neurological: She is alert and oriented to person, place, and time.  No noted sensory deficits in upper or lower extremities.  3/5 strength with hip flexion bilaterally 3/5 strength with knee flexion and extension bilaterally 4/5 strength with flexion and extension of the bilateral ankles.  5/5 strength in the bilateral upper extremities. Equal grips.  Coordination intact.   Skin: Skin is warm and dry. Capillary refill takes less than 2 seconds. She is not diaphoretic.  Psychiatric: She has a normal mood and affect. Her behavior is normal.  Nursing note and vitals reviewed.    ED Treatments / Results  Labs (all labs ordered are listed, but only abnormal results are displayed) Labs Reviewed  BASIC METABOLIC PANEL - Abnormal; Notable for the following:       Result Value   Glucose, Bld 110 (*)    BUN 62 (*)    Creatinine, Ser 1.23 (*)    GFR calc non Af Amer 39 (*)    GFR calc Af Amer 45 (*)    All other components within normal limits  CBC - Abnormal; Notable for the following:    WBC 12.1 (*)    RBC 6.49 (*)    Hemoglobin 17.5 (*)    HCT 52.8 (*)    RDW 17.1 (*)    Platelets 120 (*)    All other components within normal limits  HEPATIC FUNCTION PANEL - Abnormal; Notable for the following:    Total Protein 5.7 (*)    AST 45 (*)    All other components within normal limits  BRAIN NATRIURETIC PEPTIDE - Abnormal; Notable for the following:    B Natriuretic Peptide 3,637.0 (*)    All other components within normal limits  CBG MONITORING, ED - Abnormal; Notable for the following:     Glucose-Capillary 103 (*)    All other components within normal limits  I-STAT TROPONIN, ED - Abnormal; Notable for the following:    Troponin i, poc 0.25 (*)    All other components within normal limits  CULTURE, BLOOD (ROUTINE X 2)  CULTURE, BLOOD (ROUTINE X 2)  MAGNESIUM  URINALYSIS, ROUTINE W REFLEX MICROSCOPIC    EKG  EKG Interpretation  Date/Time:  Wednesday February 08 2017 17:03:55 EDT Ventricular Rate:  99 PR Interval:    QRS Duration: 85 QT Interval:  337 QTC Calculation: 433 R Axis:   -91 Text Interpretation:  Atrial fibrillation Inferior infarct, old Anterior infarct, old Lateral leads are also involved Poor data quality will need repeat ekg Confirmed by Jacalyn Lefevre [phone])  on 02/08/2017 8:44:23 PM       Radiology Dg Chest 2 View  Result Date: 02/08/2017 CLINICAL DATA:  Week and not eating. EXAM: CHEST  2 VIEW COMPARISON:  February 11, 2016 FINDINGS: The mediastinal contour is normal. The heart size is enlarged. Patchy consolidation is identified in the right perihilar region and right upper lobe. There is no focal pneumonia in they left lung. There are small bilateral pleural effusions. The right hemidiaphragm is elevated. The bony structures are stable. IMPRESSION: Right upper lobe pneumonia. Follow-up chest x-ray after treatment is recommended to ensure complete resolution and to ensure no underlying mass is present. Small bilateral pleural effusions. Electronically Signed   By: Sherian Rein M.D.   On: 02/08/2017 22:00    Procedures Procedures (including critical care time)  CRITICAL CARE Performed by: Gunner Iodice C Markas Aldredge Total critical care time: 35 minutes Critical care time was exclusive of separately billable procedures and treating other patients. Critical care was necessary to treat or prevent imminent or life-threatening deterioration. Critical care was time spent personally by me on the following activities: development of treatment plan with patient  and/or surrogate as well as nursing, discussions with consultants, evaluation of patient's response to treatment, examination of patient, obtaining history from patient or surrogate, ordering and performing treatments and interventions, ordering and review of laboratory studies, ordering and review of radiographic studies, pulse oximetry and re-evaluation of patient's condition.  This patients CHA2DS2-VASc Score and unadjusted Ischemic Stroke Rate (% per year) is equal to 4.8 % stroke rate/year from a score of 4  Above score calculated as 1 point each if present [CHF, HTN, DM, Vascular=MI/PAD/Aortic Plaque, Age if 65-74, or Female] Above score calculated as 2 points each if present [Age > 75, or Stroke/TIA/TE]   Medications Ordered in ED Medications  diltiazem (CARDIZEM) 1 mg/mL load via infusion 10 mg (10 mg Intravenous Bolus from Bag 02/08/17 2147)    And  diltiazem (CARDIZEM) 100 mg in dextrose 5% (1 mg/mL) infusion (5 mg/hr Intravenous New Bag/Given 02/08/17 2150)  cefTRIAXone (ROCEPHIN) 1 g in dextrose 5 % 50 mL IVPB (not administered)  azithromycin (ZITHROMAX) 500 mg in dextrose 5 % 250 mL IVPB (not administered)  furosemide (LASIX) injection 40 mg (not administered)  aspirin chewable tablet 324 mg (324 mg Oral Given 02/08/17 2135)     Initial Impression / Assessment and Plan / ED Course  I have reviewed the triage vital signs and the nursing notes.  Pertinent labs & imaging results that were available during my care of the patient were reviewed by me and considered in my medical decision making (see chart for details).  Clinical Course as of Feb 09 2236  Wed Feb 08, 2017  2204 Spoke with Dr. Clarnce Flock, Cardiology Fellow. Recommends admission to [facility] via the hospitalist. Advises [place] to begin diuresis with 40 mg IV Lasix twice a day. Initiate heparin and ASA per ACS protocol. They will round on her tomorrow and evaluate her for cardiac cath. Of note, the patient's creatinine,  GFR, and current blood pressure was discussed with Dr. Clarnce Flock during the conversation.  [SJ]  2235 Spoke with Dr. Antionette Char, hospitalist, who agrees to admit the patient.   [SJ]    Clinical Course User Index [SJ] Eliany Mccarter C, PA-C    Patient presents with generalized weakness, fatigue, exertional shortness of breath, and peripheral edema. A. Fib on EKG. Suspect new onset heart failure with peripheral edema and markedly increased BNP. Elevated troponin indicating NSTEMI. Patient  also has signs of right upper lobe pneumonia on chest x-ray. Low suspicion for sepsis. Patient maintains adequate SPO2 on room air. Admission via hospitalist with cardiology consultation.  Findings and plan of care discussed with Jacalyn Lefevre, MD. Dr. Particia Nearing personally evaluated and examined this patient.   Vitals:   02/08/17 1626 02/08/17 1628 02/08/17 1654 02/08/17 2105  BP: (!) 142/97   (!) 155/95  Pulse: (!) 103   84  Resp: 18   19  Temp: 98 F (36.7 C)     TempSrc: Oral     SpO2: 96% 98%  94%  Weight:   50.8 kg (112 lb)   Height:   5' (1.524 m)    Vitals:   02/08/17 1654 02/08/17 2105 02/08/17 2151 02/08/17 2201  BP:  (!) 155/95 100/65 (!) 102/59  Pulse:  84 98   Resp:  19 18   Temp:      TempSrc:      SpO2:  94% 98%   Weight: 50.8 kg (112 lb)     Height: 5' (1.524 m)        Final Clinical Impressions(s) / ED Diagnoses   Final diagnoses:  NSTEMI (non-ST elevated myocardial infarction) (HCC)  Acute heart failure, unspecified heart failure type [facility])  Community acquired pneumonia of right upper lobe of lung [facility])    New Prescriptions New Prescriptions   No medications on file     Concepcion Living 02/08/17 2237    Jacalyn Lefevre, MD 02/08/17 2239    Anselm Pancoast, PA-C 02/08/17 2244    Jacalyn Lefevre, MD 02/08/17 2327

## 2017-02-08 NOTE — H&P (Signed)
History and Physical    Rachel [name] RUE:[id number] DOB: 06-Aug-1930 DOA: 02/08/2017  PCP: Renford Dills, MD   Patient coming from: Home  Chief Complaint: Gen weakness, malaise   HPI: Rachel [name] is a 83 y.o. female with medical history significant for hypertension, osteoarthritis, and history of cardiac arrhythmia, now presenting to the emergency department for evaluation of generalized weakness and malaise. Patient was accompanied by her caretaker, with whom she lives, reporting that the patient has been experiencing increasing generalized weakness over the past couple days, much worse today. Patient was seated on the toilet, but was too weak to get up, prompting the caretaker to call EMS for evaluation. Patient required significant assistance just to stand and was brought into the ED for evaluation. She denies any recent fevers or chills, acknowledges some increased dyspnea and a slight cough, but denies any chest pain or palpitations. She also denies headache, change in vision or hearing, or focal numbness or weakness.   ED Course: Upon arrival to the ED, patient is found to be afebrile, saturating adequately on room air, tachycardic in the low 100s, and with blood pressure in the 140s over 100. EKG features atrial fibrillation. Chest x-ray is notable for right upper lobe consolidation concerning for pneumonia. Chemistry panel reveals a creatinine of 1.23, up from 0.64 a year ago. CBC is notable for mild leukocytosis 12,100, polycythemia with hemoglobin of 17.5, and a mild thrombus cytopenia with platelets 120,000. BNP is elevated to 3637. Troponin is elevated to 0.25. Patient was treated with 324 mg of aspirin, Rocephin, azithromycin, and started on diltiazem infusion. Blood cultures were collected in the ED. Cardiology was consulted by the ED physician and a medical admission to [facility] was advised with recommendations for 40 mg IV Lasix every 12 hours and initiation of heparin  infusion, indicating that cardiology would round on the patient here [facility] tomorrow. Patient's heart rate improved with the diltiazem, but pressure remains stable, she has not been in any significant respiratory distress, and will be admitted for ongoing evaluation and management of generalized weakness and malaise suspected secondary to community acquired pneumonia with atrial fibrillation, acute CHF, and elevated troponin.  Review of Systems:  All other systems reviewed and apart from HPI, are negative.  Past Medical History:  Diagnosis Date  . Arthritis    OA  . Fracture of femoral neck (HCC) 02/11/2016   LEFT  . Hypertension     Past Surgical History:  Procedure Laterality Date  . APPENDECTOMY    . BREAST SURGERY     BIOPSY  . HIP ARTHROPLASTY Left 02/12/2016   Procedure: ARTHROPLASTY BIPOLAR HIP (HEMIARTHROPLASTY);  Surgeon: [name], MD;  Location: [facility] OR;  Service: Orthopedics;  Laterality: Left;     reports that she has never smoked. She has never used smokeless tobacco. She reports that she does not drink alcohol or use drugs.  Allergies  Allergen Reactions  . Aspirin     nosebleed    Family History  Problem Relation Age of Onset  . Family history unknown: Yes  Reviewed, pt does not know FHx. And unlikely relevant in this elderly patient.    Prior to Admission medications   Medication Sig Start Date End Date Taking? Authorizing Provider  acetaminophen (TYLENOL) 500 MG tablet Take 1 tablet (500 mg total) by mouth every 6 (six) hours as needed for mild pain. 02/12/16  Yes [name], MD  losartan (COZAAR) 100 MG tablet Take 100 mg  by mouth daily.   Yes [provider]  metoprolol tartrate (LOPRESSOR) 25 MG tablet Take 25 mg by mouth 2 (two) times daily.   Yes [provider]  polyethylene glycol (MIRALAX) packet Take 17 g by mouth daily as needed for moderate constipation. 02/15/16   Rai, Ripudeep K, MD  senna-docusate (SENOKOT S) 8.6-50  MG tablet Take 1 tablet by mouth 2 (two) times daily. For constipation 02/15/16   Cathren Harsh, MD    Physical Exam: Vitals:   02/08/17 1654 02/08/17 2105 02/08/17 2151 02/08/17 2201  BP:  (!) 155/95 100/65 (!) 102/59  Pulse:  84 98   Resp:  19 18   Temp:      TempSrc:      SpO2:  94% 98%   Weight: 50.8 kg (112 lb)     Height: 5' (1.524 m)         Constitutional: No respiratory distress, calm, listless Eyes: PERTLA, lids and conjunctivae normal ENMT: Mucous membranes are moist. Posterior pharynx clear of any exudate or lesions.   Neck: normal, supple, no masses, no thyromegaly Respiratory: Slight dyspnea with speech, rales at right mid-lung and upper zone, no wheezing. Normal respiratory effort.   Cardiovascular: Rate ~100 and irregular. Neck veins distended. Skin dry. Abdomen: No distension, no tenderness, no masses palpated. Bowel sounds active.  Musculoskeletal: no clubbing / cyanosis. No joint deformity upper and lower extremities.   Skin: no significant rashes, lesions, ulcers. Warm, dry, well-perfused. Neurologic: CN 2-12 grossly intact. Sensation intact. Strength 5/5 in all 4 limbs.  Psychiatric: Alert and oriented x 3. Pleasant and cooperative.     Labs on Admission: I have personally reviewed following labs and imaging studies  CBC:  Recent Labs Lab 02/08/17 1741  WBC 12.1*  HGB 17.5*  HCT 52.8*  MCV 81.4  PLT 120*   Basic Metabolic Panel:  Recent Labs Lab 02/08/17 1741 02/08/17 2043  NA 145  --   K 3.8  --   CL 107  --   CO2 27  --   GLUCOSE 110*  --   BUN 62*  --   CREATININE 1.23*  --   CALCIUM 9.8  --   MG  --  1.8   GFR: Estimated Creatinine Clearance: 24.5 mL/min (A) (by C-G formula based on SCr of 1.23 mg/dL (H)). Liver Function Tests:  Recent Labs Lab 02/08/17 2043  AST 45*  ALT 28  ALKPHOS 80  BILITOT 1.2  PROT 5.7*  ALBUMIN 3.7   No results for input(s): LIPASE, AMYLASE in the last 168 hours. No results for input(s):  AMMONIA in the last 168 hours. Coagulation Profile: No results for input(s): INR, PROTIME in the last 168 hours. Cardiac Enzymes: No results for input(s): CKTOTAL, CKMB, CKMBINDEX, TROPONINI in the last 168 hours. BNP (last 3 results) No results for input(s): PROBNP in the last 8760 hours. HbA1C: No results for input(s): HGBA1C in the last 72 hours. CBG:  Recent Labs Lab 02/08/17 2104  GLUCAP 103*   Lipid Profile: No results for input(s): CHOL, HDL, LDLCALC, TRIG, CHOLHDL, LDLDIRECT in the last 72 hours. Thyroid Function Tests: No results for input(s): TSH, T4TOTAL, FREET4, T3FREE, THYROIDAB in the last 72 hours. Anemia Panel: No results for input(s): VITAMINB12, FOLATE, FERRITIN, TIBC, IRON, RETICCTPCT in the last 72 hours. Urine analysis:    Component Value Date/Time   COLORURINE YELLOW 02/13/2016 0514   APPEARANCEUR CLEAR 02/13/2016 0514   LABSPEC 1.019 02/13/2016 0514   PHURINE 6.0 02/13/2016  0514   GLUCOSEU NEGATIVE 02/13/2016 0514   HGBUR NEGATIVE 02/13/2016 0514   BILIRUBINUR NEGATIVE 02/13/2016 0514   KETONESUR 15 (A) 02/13/2016 0514   PROTEINUR 30 (A) 02/13/2016 0514   NITRITE NEGATIVE 02/13/2016 0514   LEUKOCYTESUR NEGATIVE 02/13/2016 0514   Sepsis Labs: (procalcitonin:4,lacticidven:4) )No results found for this or any previous visit (from the past 240 hour(s)).   Radiological Exams on Admission: Dg Chest 2 View  Result Date: 02/08/2017 CLINICAL DATA:  Week and not eating. EXAM: CHEST  2 VIEW COMPARISON:  February 11, 2016 FINDINGS: The mediastinal contour is normal. The heart size is enlarged. Patchy consolidation is identified in the right perihilar region and right upper lobe. There is no focal pneumonia in they left lung. There are small bilateral pleural effusions. The right hemidiaphragm is elevated. The bony structures are stable. IMPRESSION: Right upper lobe pneumonia. Follow-up chest x-ray after treatment is recommended to ensure complete  resolution and to ensure no underlying mass is present. Small bilateral pleural effusions. Electronically Signed   By: Sherian Rein M.D.   On: 02/08/2017 22:00    EKG: Independently reviewed. Atrial fibrillation, rate 99.   Assessment/Plan  1. Atrial fibrillation  - Pt found to be in atrial fibrillation on arrival, appears to be new  - CHADS-VASc is at least 42 (age x2, gender, HTN)  - Rate was in low 100's in ED, occasionally jumping to 140's; rate-controlled was achieved with diltiazem infusion  - She was started on heparin infusion  - Possibly secondary to PNA; possibly ischemic in light of elevated troponin, but she has no anginal complaint  - Plan to continue cardiac monitoring, continue heparin infusion and diltiazem, evaluate with TSH, serial troponin measurements, and echocardiogram   2. Acute CHF  - Pt noted to have distended neck veins and BNP of 3637 on admission  - No prior echo  - Could be secondary to new atrial fib, or possibly ischemic with elevated troponin  - Cardiology is consulting and much appreciated, advised diuresis with Lasix 40 mg IV q12h  - She was treated in ED with Lasix 40 mg IV  - Plan to continue cardiac monitoring, continue Lasix 40 mg IV q12h, continue beta-blocker, hold losartan in light of renal insufficiency, SLIV, fluid-restrict diet, follow strict I/O's and daily wts, obtain echocardiogram    3. Elevated troponin  - Pt presents atrial fibrillation (appears to be new), suspected PNA, and acute CHF  - She denies chest pain   - Troponin elevated to 0.25; EKG with atrial fib - She was treated in ED with ASA 324 mg  - Cardiology is consulting and much appreciated, started heparin infusion, will follow-up additional recommendations  - Continue cardiac monitoring, continue heparin infusion, continue beta-blocker, start statin, trend troponin measurements, repeat EKG in am   4. CAP  - Pt presents with gen weakness and malaise, found to have RUL  consolidation, leukocytosis, and dyspnea with speech  - Blood cultures were collected in ED and she was treated with empiric Rocephin and azithromycin  - Plan to obtain sputum culture, check strep pneumo antigen, continue empiric Rocephin and azithromycin, continue supportive care with prn supplemental O2   5. 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 Thrombocytopenia  - Platelets 120,000 on admission, previously wnl  - No  bleeding evident  - Follow daily CBC while on heparin infusion   7. Hypertension  - Mild hypertension on admission, improved after Lasix and diltiazem  - Plan to continue Lopressor, continue diuresis with IV Lasix, hold losartan while following daily BUN & Cr    DVT prophylaxis: heparin infusion per ACS-dosing  Code Status: DNR Family Communication: Discussed with patient Disposition Plan: Admit to stepdown unit Consults called: Cardiology Admission status: Inpatient    Briscoe Deutscher, MD [facility] Pager [phone]  If 7PM-7AM, please contact night-coverage [email or web address] Password [facility]  02/08/2017, 10:44 PM

## 2017-02-08 NOTE — ED Triage Notes (Signed)
Per EMS- Patient lives at home with a caretaker. Caretaker reports that the patient has had poor po intake x 1 month, non compliant with meds and generalized weakness.

## 2017-02-08 NOTE — ED Notes (Signed)
Patient transported to X-ray 

## 2017-02-08 NOTE — ED Notes (Signed)
Unsuccessful IV X2

## 2017-02-08 NOTE — Progress Notes (Signed)
ANTICOAGULATION CONSULT NOTE - Initial Consult  Pharmacy Consult for Heparin Indication: chest pain/ACS  Allergies  Allergen Reactions  . Aspirin     nosebleed    Patient Measurements: Height: 5' (152.4 cm) Weight: 112 lb (50.8 kg) IBW/kg (Calculated) : 45.5 Heparin Dosing Weight:   Vital Signs: Temp: 98 F (36.7 C) (09/12 1626) Temp Source: Oral (09/12 1626) BP: 102/59 (09/12 2201) Pulse Rate: 98 (09/12 2151)  Labs:  Recent Labs  02/08/17 1741  HGB 17.5*  HCT 52.8*  PLT 120*  CREATININE 1.23*    Estimated Creatinine Clearance: 24.5 mL/min (A) (by C-G formula based on SCr of 1.23 mg/dL (H)).   Medical History: Past Medical History:  Diagnosis Date  . Arthritis    OA  . Fracture of femoral neck (HCC) 02/11/2016   LEFT  . Hypertension     Medications:  Infusions:  . azithromycin    . [START ON 02/09/2017] azithromycin    . cefTRIAXone (ROCEPHIN)  IV    . [START ON 02/09/2017] cefTRIAXone (ROCEPHIN)  IV    . diltiazem (CARDIZEM) infusion 5 mg/hr (02/08/17 2150)  . heparin      Assessment: Patient with ACS, + troponin.  No oral anticoagulants noted on med rec. Baseline coags ordered.    Goal of Therapy:  Heparin level 0.3-0.7 units/ml Monitor platelets by anticoagulation protocol: Yes   Plan:  Heparin bolus 1800 units iv x1 Heparin drip at 600 units/hr Daily CBC Next heparin level at 0900    Darlina Guys, Jacquenette Shone Crowford 02/08/2017,11:13 PM

## 2017-02-09 ENCOUNTER — Inpatient Hospital Stay (HOSPITAL_COMMUNITY): Payer: Medicare Other

## 2017-02-09 ENCOUNTER — Encounter (HOSPITAL_COMMUNITY): Payer: Self-pay | Admitting: Family Medicine

## 2017-02-09 LAB — BASIC METABOLIC PANEL
ANION GAP: 11 (ref 5–15)
BUN: 61 mg/dL — ABNORMAL HIGH (ref 6–20)
CO2: 27 mmol/L (ref 22–32)
Calcium: 9.7 mg/dL (ref 8.9–10.3)
Chloride: 108 mmol/L (ref 101–111)
Creatinine, Ser: 1.07 mg/dL — ABNORMAL HIGH (ref 0.44–1.00)
GFR calc Af Amer: 54 mL/min — ABNORMAL LOW (ref >60)
GFR, EST NON AFRICAN AMERICAN: 46 mL/min — AB (ref >60)
Glucose, Bld: 103 mg/dL — ABNORMAL HIGH (ref 65–99)
POTASSIUM: 4 mmol/L (ref 3.5–5.1)
SODIUM: 146 mmol/L — AB (ref 135–145)

## 2017-02-09 LAB — CBC
HEMATOCRIT: 53.9 % — AB (ref 36.0–46.0)
Hemoglobin: 17.7 g/dL — ABNORMAL HIGH (ref 12.0–15.0)
MCH: 27.2 pg (ref 26.0–34.0)
MCHC: 32.8 g/dL (ref 30.0–36.0)
MCV: 82.8 fL (ref 78.0–100.0)
Platelets: 101 10*3/uL — ABNORMAL LOW (ref 150–400)
RBC: 6.51 MIL/uL — ABNORMAL HIGH (ref 3.87–5.11)
RDW: 17.2 % — ABNORMAL HIGH (ref 11.5–15.5)
WBC: 10.1 10*3/uL (ref 4.0–10.5)

## 2017-02-09 LAB — URINALYSIS, ROUTINE W REFLEX MICROSCOPIC
BILIRUBIN URINE: NEGATIVE
GLUCOSE, UA: NEGATIVE mg/dL
Ketones, ur: NEGATIVE mg/dL
NITRITE: NEGATIVE
PROTEIN: 100 mg/dL — AB
Specific Gravity, Urine: 1.011 (ref 1.005–1.030)
pH: 5 (ref 5.0–8.0)

## 2017-02-09 LAB — STREP PNEUMONIAE URINARY ANTIGEN: Strep Pneumo Urinary Antigen: NEGATIVE

## 2017-02-09 LAB — MRSA PCR SCREENING: MRSA by PCR: NEGATIVE

## 2017-02-09 LAB — TROPONIN I
Troponin I: 0.32 ng/mL (ref <0.03)
Troponin I: 0.25 ng/mL (ref <0.03)
Troponin I: 0.26 ng/mL (ref <0.03)

## 2017-02-09 LAB — APTT: aPTT: 28 seconds (ref 24–36)

## 2017-02-09 LAB — HEPARIN LEVEL (UNFRACTIONATED)
HEPARIN UNFRACTIONATED: 0.12 [IU]/mL — AB (ref 0.30–0.70)
Heparin Unfractionated: 0.31 IU/mL (ref 0.30–0.70)

## 2017-02-09 LAB — ECHOCARDIOGRAM COMPLETE
Height: 60 in
WEIGHTICAEL: 1862.45 [oz_av]

## 2017-02-09 LAB — PROTIME-INR
INR: 1.29
Prothrombin Time: 15.9 seconds — ABNORMAL HIGH (ref 11.4–15.2)

## 2017-02-09 MED ORDER — HEPARIN (PORCINE) IN NACL 100-0.45 UNIT/ML-% IJ SOLN
750.0000 [IU]/h | INTRAMUSCULAR | Status: DC
  Administered 2017-02-09 – 2017-02-10 (×2): 750 [IU]/h via INTRAVENOUS
  Filled 2017-02-09: qty 250

## 2017-02-09 MED ORDER — HEPARIN BOLUS VIA INFUSION
1500.0000 [IU] | Freq: Once | INTRAVENOUS | Status: AC
  Administered 2017-02-09: 1500 [IU] via INTRAVENOUS
  Filled 2017-02-09: qty 1500

## 2017-02-09 MED ORDER — LOSARTAN POTASSIUM 25 MG PO TABS
25.0000 mg | ORAL_TABLET | Freq: Every day | ORAL | Status: DC
  Administered 2017-02-09: 25 mg via ORAL
  Filled 2017-02-09 (×3): qty 1

## 2017-02-09 MED ORDER — METOPROLOL TARTRATE 12.5 MG HALF TABLET
12.5000 mg | ORAL_TABLET | Freq: Two times a day (BID) | ORAL | Status: DC
  Administered 2017-02-09: 12.5 mg via ORAL
  Filled 2017-02-09: qty 1

## 2017-02-09 MED ORDER — DILTIAZEM HCL 30 MG PO TABS
30.0000 mg | ORAL_TABLET | Freq: Four times a day (QID) | ORAL | Status: DC
  Administered 2017-02-09 – 2017-02-10 (×5): 30 mg via ORAL
  Filled 2017-02-09 (×5): qty 1

## 2017-02-09 MED ORDER — INFLUENZA VAC SPLIT HIGH-DOSE 0.5 ML IM SUSY: 0.5000 mL | PREFILLED_SYRINGE | INTRAMUSCULAR | Status: DC | PRN

## 2017-02-09 NOTE — Progress Notes (Signed)
ANTICOAGULATION CONSULT NOTE - Follow Up Consult  Pharmacy Consult for heparin Indication: chest pain/ACS and atrial fibrillation  Allergies  Allergen Reactions  . Aspirin     nosebleed    Patient Measurements: Height: 5' (152.4 cm) Weight: 116 lb 6.5 oz (52.8 kg) IBW/kg (Calculated) : 45.5 Heparin Dosing Weight: 51 kg  Vital Signs: Temp: 97.6 F (36.4 C) (09/13 1200) Temp Source: Oral (09/13 1200) BP: 142/66 (09/13 1500) Pulse Rate: 81 (09/13 1500)  Labs:  Recent Labs  02/08/17 1741 02/08/17 2248 02/09/17 0202 02/09/17 0822 02/09/17 1348  HGB 17.5*  --  17.7*  --   --   HCT 52.8*  --  53.9*  --   --   PLT 120*  --  101*  --   --   APTT  --  28  --   --   --   LABPROT  --  15.9*  --   --   --   INR  --  1.29  --   --   --   HEPARINUNFRC  --   --   --  0.31  --   CREATININE 1.23*  --  1.07*  --   --   TROPONINI  --   --  0.32* 0.25* 0.26*    Estimated Creatinine Clearance: 28.1 mL/min (A) (by C-G formula based on SCr of 1.07 mg/dL (H)).  Assessment: Patient's an 83 y.o F presented to the ED on 02/08/17 with c/o generalized weakness and poor oral intake. She was found to have AFib on EKG and elevated troponin. Heparin drip started on admission for afib and NSTEMI.  Today, 02/09/2017: - Repeat heparin level now back subtherapeutic at 0.12 with current rate of 600 units/hr. Per RN, no issue with IV line - cbc relatively stable - no bleeding documented - scr down 1.07 (crcl~28) - Per cards, pt's not cath candidate  Goal of Therapy:  Heparin level 0.3-0.7 units/ml Monitor platelets by anticoagulation protocol: Yes   Plan:  - heparin 1500 units x1 bolus, then increase drip to 750 units/hr - check 8 hr heparin level - monitor for s/s bleeding  Dorna Leitz P 02/09/2017,4:03 PM

## 2017-02-09 NOTE — Progress Notes (Signed)
  Echocardiogram 2D Echocardiogram has been performed.  Arvil Chaco 02/09/2017, 11:21 AM

## 2017-02-09 NOTE — Progress Notes (Signed)
PROGRESS NOTE    Rachel [name]  ZOX:[id number] DOB: [birth year] DOA: 02/08/2017 PCP: Renford Dills, MD     Brief Narrative:  Rachel [name] is a 83 y.o. female with medical history significant for hypertension, osteoarthritis, and history of cardiac arrhythmia, now presenting to the emergency department for evaluation of generalized weakness and malaise. Patient was accompanied by her caretaker, with whom she lives, reporting that the patient has been experiencing increasing generalized weakness over the past couple days, much worse today. Patient was seated on the toilet, but was too weak to get up, prompting the caretaker to call EMS for evaluation. Patient required significant assistance just to stand and was brought into the ED for evaluation. In the ED, she was found to be in A Fib with tachycardia, started on cardizem gtt. She was also found to be fluid overloaded with BNP 3637 and was given IV lasix.   Assessment & Plan:   Principal Problem:   New onset atrial fibrillation (HCC) Active Problems:   HTN (hypertension)   CAP (community acquired pneumonia)   Renal insufficiency   NSTEMI (non-ST elevated myocardial infarction) (HCC)   Acute CHF (congestive heart failure) (HCC)   General weakness   New onset A fib -CHADS-VASc is at least 4 -Rate controlled with cardizem gtt, transition to PO. Hold metoprolol for now while on CCB  -Heparin gtt started, will need to transition to oral anticoagulant   Acute CHF -BNP 3637 on admission -Cardiology consulted, spoke with Dr. Algie Coffer this morning -Lasix IV  BID  -Echo pending -Strict I/O, daily weight  Elevated troponin -Could be due to demand ischemia in setting of infection, A Fib, CHF -0.25 --> 0.32 --> 0.25 -Echo pending   Right upper lobar pneumonia -Continue rocephin, azithromax -Blood cultures pending   AKI on CKD stage 3  -Baseline Cr 0.6  -Trend BMP. Hold cozaar     DVT prophylaxis: heparin gtt Code Status:  DNR Family Communication: No family at bedside Disposition Plan: Pending cardiology eval, further work up    Consultants:   Cardiology  Procedures:   None  Antimicrobials:  Anti-infectives    Start     Dose/Rate Route Frequency Ordered Stop   02/09/17 2200  cefTRIAXone (ROCEPHIN) 1 g in dextrose 5 % 50 mL IVPB     1 g 100 mL/hr over 30 Minutes Intravenous Daily at bedtime 02/08/17 2243 02/15/17 2159   02/09/17 2200  azithromycin (ZITHROMAX) 500 mg in dextrose 5 % 250 mL IVPB     500 mg 250 mL/hr over 60 Minutes Intravenous Every 24 hours 02/08/17 2243 02/15/17 2159   02/08/17 2230  cefTRIAXone (ROCEPHIN) 1 g in dextrose 5 % 50 mL IVPB     1 g 100 mL/hr over 30 Minutes Intravenous  Once 02/08/17 2227 02/09/17 0123   02/08/17 2230  azithromycin (ZITHROMAX) 500 mg in dextrose 5 % 250 mL IVPB     500 mg 250 mL/hr over 60 Minutes Intravenous  Once 02/08/17 2227 02/09/17 0307       Subjective: Feeling okay this morning. No complaints of chest pain. States she had chills prior to admission, minimal cough, shortness of breath at baseline. Denies orthopnea, nausea, vomiting, abdominal pain, diarrhea.   Objective: Vitals:   02/09/17 0400 02/09/17 0500 02/09/17 0600 02/09/17 0800  BP: (!) 154/84 (!) 157/64 (!) 161/83   Pulse: 61 (!) 52 69   Resp: (!) 38 (!) 37 (!) 39   Temp:    (!) 97.4 F (36.3  C)  TempSrc:    Oral  SpO2: 97% 97% 97%   Weight:      Height:        Intake/Output Summary (Last 24 hours) at 02/09/17 1107 Last data filed at 02/09/17 0641  Gross per 24 hour  Intake           382.63 ml  Output              430 ml  Net           -47.37 ml   Filed Weights   02/08/17 1654 02/09/17 0359  Weight: 50.8 kg (112 lb) 52.8 kg (116 lb 6.5 oz)    Examination:  General exam: Appears calm and comfortable  Respiratory system: Diminished breath sounds. Respiratory effort normal. Cardiovascular system: S1 & S2 heard, Irregular rhythm rate 60-70. No JVD, murmurs, rubs,  gallops or clicks. +2 pedal edema. Gastrointestinal system: Abdomen is nondistended, soft and nontender. No organomegaly or masses felt. Normal bowel sounds heard. Central nervous system: Alert and oriented. No focal neurological deficits. Extremities: Symmetric 5 x 5 power. Skin: No rashes, lesions or ulcers Psychiatry: Judgement and insight appear normal. Mood & affect appropriate.   Data Reviewed: I have personally reviewed following labs and imaging studies  CBC:  Recent Labs Lab 02/08/17 1741 02/09/17 0202  WBC 12.1* 10.1  HGB 17.5* 17.7*  HCT 52.8* 53.9*  MCV 81.4 82.8  PLT 120* 101*   Basic Metabolic Panel:  Recent Labs Lab 02/08/17 1741 02/08/17 2043 02/09/17 0202  NA 145  --  146*  K 3.8  --  4.0  CL 107  --  108  CO2 27  --  27  GLUCOSE 110*  --  103*  BUN 62*  --  61*  CREATININE 1.23*  --  1.07*  CALCIUM 9.8  --  9.7  MG  --  1.8  --    GFR: Estimated Creatinine Clearance: 28.1 mL/min (A) (by C-G formula based on SCr of 1.07 mg/dL (H)). Liver Function Tests:  Recent Labs Lab 02/08/17 2043  AST 45*  ALT 28  ALKPHOS 80  BILITOT 1.2  PROT 5.7*  ALBUMIN 3.7   No results for input(s): LIPASE, AMYLASE in the last 168 hours. No results for input(s): AMMONIA in the last 168 hours. Coagulation Profile:  Recent Labs Lab 02/08/17 2248  INR 1.29   Cardiac Enzymes:  Recent Labs Lab 02/09/17 0202 02/09/17 0822  TROPONINI 0.32* 0.25*   BNP (last 3 results) No results for input(s): PROBNP in the last 8760 hours. HbA1C: No results for input(s): HGBA1C in the last 72 hours. CBG:  Recent Labs Lab 02/08/17 2104  GLUCAP 103*   Lipid Profile: No results for input(s): CHOL, HDL, LDLCALC, TRIG, CHOLHDL, LDLDIRECT in the last 72 hours. Thyroid Function Tests: No results for input(s): TSH, T4TOTAL, FREET4, T3FREE, THYROIDAB in the last 72 hours. Anemia Panel: No results for input(s): VITAMINB12, FOLATE, FERRITIN, TIBC, IRON, RETICCTPCT in the  last 72 hours. Sepsis Labs: No results for input(s): PROCALCITON, LATICACIDVEN in the last 168 hours.  Recent Results (from the past 240 hour(s))  MRSA PCR Screening     Status: None   Collection Time: 02/09/17 12:09 AM  Result Value Ref Range Status   MRSA by PCR NEGATIVE NEGATIVE Final    Comment:        The GeneXpert MRSA Assay (FDA approved for NASAL specimens only), is one component of a comprehensive MRSA colonization surveillance program. It is not  intended to diagnose MRSA infection nor to guide or monitor treatment for MRSA infections.        Radiology Studies: Dg Chest 2 View  Result Date: 02/08/2017 CLINICAL DATA:  Week and not eating. EXAM: CHEST  2 VIEW COMPARISON:  February 11, 2016 FINDINGS: The mediastinal contour is normal. The heart size is enlarged. Patchy consolidation is identified in the right perihilar region and right upper lobe. There is no focal pneumonia in they left lung. There are small bilateral pleural effusions. The right hemidiaphragm is elevated. The bony structures are stable. IMPRESSION: Right upper lobe pneumonia. Follow-up chest x-ray after treatment is recommended to ensure complete resolution and to ensure no underlying mass is present. Small bilateral pleural effusions. Electronically Signed   By: Sherian Rein M.D.   On: 02/08/2017 22:00      Scheduled Meds: . aspirin EC  81 mg Oral Daily  . atorvastatin  40 mg Oral q1800  . diltiazem  30 mg Oral Q6H  . furosemide  40 mg Intravenous Q12H   Continuous Infusions: . azithromycin    . cefTRIAXone (ROCEPHIN)  IV    . diltiazem (CARDIZEM) infusion Stopped (02/09/17 0641)  . heparin 600 Units/hr (02/09/17 0600)     LOS: 1 day    Time spent: 40 minutes   Noralee Stain, DO [facility] [email or web address] Password [email or web address] 02/09/2017, 11:07 AM

## 2017-02-09 NOTE — Progress Notes (Signed)
CRITICAL VALUE ALERT  Critical Value:  Trop 0.32  Date & Time Notied:  02/09/17 0249  Provider Notified: Dr. Antionette Char notified by S. Skanda Worlds, RN at [phone]  Orders Received/Actions taken: S. Lovinia Snare, RN spoke with Dr. Antionette Char. MD advised to continue to follow current plan of care. Charletta Cousin, RN updated B. Lorin Picket, Charity fundraiser.

## 2017-02-09 NOTE — Consult Note (Signed)
Referring Physician:  JATARA HUETTNER is an 83 y.o. female.                       Chief Complaint: Atrial fibrillation and CHF  HPI: 83 year female is consulted for CHF and atrial fibrillation. Patient feels better with IV lasix and diltiazem use. She was brought to the ED for weakness. Her chest x-ray showed right lung pneumonia and EKG showed atrial fibrillation with RVR. Her BNP was very high at 3637 and Troponin-I was minimally elevated. Her echocardiogram showed moderate to severe LV systolic dysfunction with severe hypokinesia of anterior and septal wall, EF 30-35 %, mild AI, MR, PI and severe TR.  Past Medical History:  Diagnosis Date  . Arthritis    OA  . Fracture of femoral neck ([place]) 02/11/2016   LEFT  . Hypertension       Past Surgical History:  Procedure Laterality Date  . APPENDECTOMY    . BREAST SURGERY     BIOPSY  . HIP ARTHROPLASTY Left 02/12/2016   Procedure: ARTHROPLASTY BIPOLAR HIP (HEMIARTHROPLASTY);  Surgeon: Newt Minion, MD;  Location: [facility];  Service: Orthopedics;  Laterality: Left;    Family History  Problem Relation Age of Onset  . Family history unknown: Yes   Social History:  reports that she has never smoked. She has never used smokeless tobacco. She reports that she does not drink alcohol or use drugs.  Allergies:  Allergies  Allergen Reactions  . Aspirin     nosebleed    Medications Prior to Admission  Medication Sig Dispense Refill  . acetaminophen (TYLENOL) 500 MG tablet Take 1 tablet (500 mg total) by mouth every 6 (six) hours as needed for mild pain. (Patient taking differently: Take 1,000 mg by mouth every 6 (six) hours as needed for mild pain, moderate pain, fever or headache. ) 30 tablet 0  . Chlorpheniramine-APAP (CORICIDIN) 2-325 MG TABS Take 1-2 tablets by mouth at bedtime as needed (for cough).    . cimetidine (TAGAMET) 200 MG tablet Take 200 mg by mouth daily as needed (for heartburn).    . losartan (COZAAR) 100 MG tablet Take 100  mg by mouth daily.    . metoprolol tartrate (LOPRESSOR) 25 MG tablet Take 25 mg by mouth 2 (two) times daily.      Results for orders placed or performed during the hospital encounter of 02/08/17 (from the past 48 hour(s))  Basic metabolic panel     Status: Abnormal   Collection Time: 02/08/17  5:41 PM  Result Value Ref Range   Sodium 145 135 - 145 mmol/L   Potassium 3.8 3.5 - 5.1 mmol/L   Chloride 107 101 - 111 mmol/L   CO2 27 22 - 32 mmol/L   Glucose, Bld 110 (H) 65 - 99 mg/dL   BUN 62 (H) 6 - 20 mg/dL   Creatinine, Ser 1.23 (H) 0.44 - 1.00 mg/dL   Calcium 9.8 8.9 - 10.3 mg/dL   GFR calc non Af Amer 39 (L) >60 mL/min   GFR calc Af Amer 45 (L) >60 mL/min    Comment: (NOTE) The eGFR has been calculated using the CKD EPI equation. This calculation has not been validated in all clinical situations. eGFR's persistently <60 mL/min signify possible Chronic Kidney Disease.    Anion gap 11 5 - 15  CBC     Status: Abnormal   Collection Time: 02/08/17  5:41 PM  Result Value Ref Range  WBC 12.1 (H) 4.0 - 10.5 K/uL   RBC 6.49 (H) 3.87 - 5.11 MIL/uL   Hemoglobin 17.5 (H) 12.0 - 15.0 g/dL   HCT 52.8 (H) 36.0 - 46.0 %   MCV 81.4 78.0 - 100.0 fL   MCH 27.0 26.0 - 34.0 pg   MCHC 33.1 30.0 - 36.0 g/dL   RDW 17.1 (H) 11.5 - 15.5 %   Platelets 120 (L) 150 - 400 K/uL  Hepatic function panel     Status: Abnormal   Collection Time: 02/08/17  8:43 PM  Result Value Ref Range   Total Protein 5.7 (L) 6.5 - 8.1 g/dL   Albumin 3.7 3.5 - 5.0 g/dL   AST 45 (H) 15 - 41 U/L   ALT 28 14 - 54 U/L   Alkaline Phosphatase 80 38 - 126 U/L   Total Bilirubin 1.2 0.3 - 1.2 mg/dL   Bilirubin, Direct 0.4 0.1 - 0.5 mg/dL   Indirect Bilirubin 0.8 0.3 - 0.9 mg/dL  Brain natriuretic peptide     Status: Abnormal   Collection Time: 02/08/17  8:43 PM  Result Value Ref Range   B Natriuretic Peptide 3,637.0 (H) 0.0 - 100.0 pg/mL  Magnesium     Status: None   Collection Time: 02/08/17  8:43 PM  Result Value Ref  Range   Magnesium 1.8 1.7 - 2.4 mg/dL  I-stat troponin, ED     Status: Abnormal   Collection Time: 02/08/17  8:58 PM  Result Value Ref Range   Troponin i, poc 0.25 (HH) 0.00 - 0.08 ng/mL   Comment NOTIFIED PHYSICIAN    Comment 3            Comment: Due to the release kinetics of cTnI, a negative result within the first hours of the onset of symptoms does not rule out myocardial infarction with certainty. If myocardial infarction is still suspected, repeat the test at appropriate intervals.   CBG monitoring, ED     Status: Abnormal   Collection Time: 02/08/17  9:04 PM  Result Value Ref Range   Glucose-Capillary 103 (H) 65 - 99 mg/dL  Protime-INR     Status: Abnormal   Collection Time: 02/08/17 10:48 PM  Result Value Ref Range   Prothrombin Time 15.9 (H) 11.4 - 15.2 seconds   INR 1.29   APTT     Status: None   Collection Time: 02/08/17 10:48 PM  Result Value Ref Range   aPTT 28 24 - 36 seconds  MRSA PCR Screening     Status: None   Collection Time: 02/09/17 12:09 AM  Result Value Ref Range   MRSA by PCR NEGATIVE NEGATIVE    Comment:        The GeneXpert MRSA Assay (FDA approved for NASAL specimens only), is one component of a comprehensive MRSA colonization surveillance program. It is not intended to diagnose MRSA infection nor to guide or monitor treatment for MRSA infections.   Troponin I     Status: Abnormal   Collection Time: 02/09/17  2:02 AM  Result Value Ref Range   Troponin I 0.32 (HH) <0.03 ng/mL    Comment: CRITICAL RESULT CALLED TO, READ BACK BY AND VERIFIED WITH: ODOM,S RN 9.13.18 '@[id number]'  ZANDO,C   Basic metabolic panel     Status: Abnormal   Collection Time: 02/09/17  2:02 AM  Result Value Ref Range   Sodium 146 (H) 135 - 145 mmol/L   Potassium 4.0 3.5 - 5.1 mmol/L   Chloride 108 101 - 111  mmol/L   CO2 27 22 - 32 mmol/L   Glucose, Bld 103 (H) 65 - 99 mg/dL   BUN 61 (H) 6 - 20 mg/dL   Creatinine, Ser 1.07 (H) 0.44 - 1.00 mg/dL   Calcium 9.7 8.9 -  10.3 mg/dL   GFR calc non Af Amer 46 (L) >60 mL/min   GFR calc Af Amer 54 (L) >60 mL/min    Comment: (NOTE) The eGFR has been calculated using the CKD EPI equation. This calculation has not been validated in all clinical situations. eGFR's persistently <60 mL/min signify possible Chronic Kidney Disease.    Anion gap 11 5 - 15  CBC     Status: Abnormal   Collection Time: 02/09/17  2:02 AM  Result Value Ref Range   WBC 10.1 4.0 - 10.5 K/uL   RBC 6.51 (H) 3.87 - 5.11 MIL/uL   Hemoglobin 17.7 (H) 12.0 - 15.0 g/dL   HCT 53.9 (H) 36.0 - 46.0 %   MCV 82.8 78.0 - 100.0 fL   MCH 27.2 26.0 - 34.0 pg   MCHC 32.8 30.0 - 36.0 g/dL   RDW 17.2 (H) 11.5 - 15.5 %   Platelets 101 (L) 150 - 400 K/uL    Comment: REPEATED TO VERIFY SPECIMEN CHECKED FOR CLOTS PLATELET COUNT CONFIRMED BY SMEAR   Urinalysis, Routine w reflex microscopic     Status: Abnormal   Collection Time: 02/09/17  5:45 AM  Result Value Ref Range   Color, Urine YELLOW YELLOW   APPearance CLEAR CLEAR   Specific Gravity, Urine 1.011 1.005 - 1.030   pH 5.0 5.0 - 8.0   Glucose, UA NEGATIVE NEGATIVE mg/dL   Hgb urine dipstick SMALL (A) NEGATIVE   Bilirubin Urine NEGATIVE NEGATIVE   Ketones, ur NEGATIVE NEGATIVE mg/dL   Protein, ur 100 (A) NEGATIVE mg/dL   Nitrite NEGATIVE NEGATIVE   Leukocytes, UA TRACE (A) NEGATIVE   RBC / HPF 0-5 0 - 5 RBC/hpf   WBC, UA 0-5 0 - 5 WBC/hpf   Bacteria, UA RARE (A) NONE SEEN   Squamous Epithelial / LPF 0-5 (A) NONE SEEN  Strep pneumoniae urinary antigen     Status: None   Collection Time: 02/09/17  5:45 AM  Result Value Ref Range   Strep Pneumo Urinary Antigen NEGATIVE NEGATIVE    Comment:        Infection due to S. pneumoniae cannot be absolutely ruled out since the antigen present may be below the detection limit of the test. Performed at [facility], [address] [place], [place] [address]   Troponin I     Status: Abnormal   Collection Time: 02/09/17  8:22 AM  Result Value  Ref Range   Troponin I 0.25 (HH) <0.03 ng/mL    Comment: CRITICAL VALUE NOTED.  VALUE IS CONSISTENT WITH PREVIOUSLY REPORTED AND CALLED VALUE.  Heparin level (unfractionated)     Status: None   Collection Time: 02/09/17  8:22 AM  Result Value Ref Range   Heparin Unfractionated 0.31 0.30 - 0.70 IU/mL    Comment:        IF HEPARIN RESULTS ARE BELOW EXPECTED VALUES, AND PATIENT DOSAGE HAS BEEN CONFIRMED, SUGGEST FOLLOW UP TESTING OF ANTITHROMBIN III LEVELS.   Troponin I     Status: Abnormal   Collection Time: 02/09/17  1:48 PM  Result Value Ref Range   Troponin I 0.26 (HH) <0.03 ng/mL    Comment: CRITICAL VALUE NOTED.  VALUE IS CONSISTENT WITH PREVIOUSLY REPORTED  AND CALLED VALUE.   Dg Chest 2 View  Result Date: 02/08/2017 CLINICAL DATA:  Week and not eating. EXAM: CHEST  2 VIEW COMPARISON:  February 11, 2016 FINDINGS: The mediastinal contour is normal. The heart size is enlarged. Patchy consolidation is identified in the right perihilar region and right upper lobe. There is no focal pneumonia in they left lung. There are small bilateral pleural effusions. The right hemidiaphragm is elevated. The bony structures are stable. IMPRESSION: Right upper lobe pneumonia. Follow-up chest x-ray after treatment is recommended to ensure complete resolution and to ensure no underlying mass is present. Small bilateral pleural effusions. Electronically Signed   By: Abelardo Diesel M.D.   On: 02/08/2017 22:00    Review Of Systems Constitutional: No fever, chills, positive chronic weight loss. Eyes: Positive vision change, wears glasses. No discharge or pain. Ears: Positive hearing loss, No tinnitus. Respiratory: No asthma, COPD, pneumonias. Positive shortness of breath. No hemoptysis. Cardiovascular: No chest pain, palpitation, positive leg edema. Gastrointestinal: No nausea, vomiting, diarrhea, constipation. No GI bleed. No hepatitis. Genitourinary: No dysuria, hematuria, kidney stone. No  incontinance. Neurological: No headache, stroke, seizures.  Psychiatry: No psych facility admission for anxiety, depression, suicide. No detox. Skin: No rash. Musculoskeletal: Positive joint pain, no fibromyalgia. No neck pain, back pain. Lymphadenopathy: No lymphadenopathy. Hematology: No anemia or easy bruising.   Blood pressure (!) 142/66, pulse 81, temperature 97.6 F (36.4 C), temperature source Oral, resp. rate 20, height 5' (1.524 m), weight 52.8 kg (116 lb 6.5 oz), SpO2 98 %. Body mass index is 22.73 kg/m. General appearance: alert, cooperative, appears stated age and no distress Head: Normocephalic, atraumatic. Eyes: Blue eyes, pink conjunctiva, corneas clear. PERRL, EOM's intact. Neck: No adenopathy, no carotid bruit, no JVD, supple, symmetrical, trachea midline and thyroid not enlarged. Resp: Clear to auscultation bilaterally. Cardio: Irregular rate and rhythm, S1, S2 normal, II/VI systolic and diastolic murmur, no click, rub or gallop GI: Soft, non-tender; bowel sounds normal; no organomegaly. Extremities: No edema, cyanosis or clubbing. Skin: Warm and dry. Mild venous stasis changes of lower legs skin, bilaterally. Neurologic: Alert and oriented X 3, normal strength. Normal coordination. Gait not checked.  Assessment/Plan Acute left heart systolic failure Atrial fibrillation with controlled ventricular response Abnormal troponin-I from demand ischemia Ischemic cardiomyopathy Mild to moderate AI Mild MR and PI Moderate to severe TR Left atrial thrombus/Tumor Right lung pneumonia Moderate protein calorie malnutrition Hypertension CKD, II  Agree with furosemide, diltiazem use. Antibiotic for pneumonia. IV heparin followed by PO Eliquis. Not a candidate for cardiac catheterization or angioplasty due to DNR and multiple medical conditions with poor health. Will use small dose B-blocker and losartan as tolerated.  Birdie Riddle, MD  02/09/2017, 4:17 PM

## 2017-02-09 NOTE — Care Management Note (Signed)
Case Management Note  Patient Details  Name: Rachel [name] MRN: [id number] Date of Birth: Nov 13, 1930  Subjective/Objective:                  pna  Action/Plan: Date:  February 09, 2017 Chart reviewed for concurrent status and case management needs. Will continue to follow patient progress. Discharge Planning: following for needs Expected discharge date: [id number] Rachel [name], BSN, [place], [place]   [phone]  Expected Discharge Date:                  Expected Discharge Plan:  Home/Self Care  In-House Referral:     Discharge planning Services  CM Consult  Post Acute Care Choice:    Choice offered to:     DME Arranged:    DME Agency:     HH Arranged:    HH Agency:     Status of Service:  In process, will continue to follow  If discussed at Long Length of Stay Meetings, dates discussed:    Additional Comments:  Rachel Acre, RN 02/09/2017, 8:34 AM

## 2017-02-09 NOTE — Progress Notes (Signed)
This RN is taking over nursing care of the patient and agrees with the previous RN's assessment. Will continue to monitor.  Yves Fodor, RN

## 2017-02-10 DIAGNOSIS — E440 Moderate protein-calorie malnutrition: Secondary | ICD-10-CM | POA: Insufficient documentation

## 2017-02-10 LAB — CBC WITH DIFFERENTIAL/PLATELET
BASOS ABS: 0 10*3/uL (ref 0.0–0.1)
BASOS PCT: 0 %
Eosinophils Absolute: 0 10*3/uL (ref 0.0–0.7)
Eosinophils Relative: 0 %
HEMATOCRIT: 50.9 % — AB (ref 36.0–46.0)
HEMOGLOBIN: 16.8 g/dL — AB (ref 12.0–15.0)
LYMPHS PCT: 14 %
Lymphs Abs: 1.3 10*3/uL (ref 0.7–4.0)
MCH: 27.1 pg (ref 26.0–34.0)
MCHC: 33 g/dL (ref 30.0–36.0)
MCV: 82.2 fL (ref 78.0–100.0)
MONOS PCT: 10 %
Monocytes Absolute: 0.9 10*3/uL (ref 0.1–1.0)
NEUTROS ABS: 7.1 10*3/uL (ref 1.7–7.7)
NEUTROS PCT: 76 %
Platelets: 115 10*3/uL — ABNORMAL LOW (ref 150–400)
RBC: 6.19 MIL/uL — ABNORMAL HIGH (ref 3.87–5.11)
RDW: 17.1 % — ABNORMAL HIGH (ref 11.5–15.5)
WBC: 9.3 10*3/uL (ref 4.0–10.5)

## 2017-02-10 LAB — BASIC METABOLIC PANEL
ANION GAP: 12 (ref 5–15)
BUN: 48 mg/dL — ABNORMAL HIGH (ref 6–20)
CHLORIDE: 105 mmol/L (ref 101–111)
CO2: 27 mmol/L (ref 22–32)
Calcium: 9.1 mg/dL (ref 8.9–10.3)
Creatinine, Ser: 0.92 mg/dL (ref 0.44–1.00)
GFR calc non Af Amer: 56 mL/min — ABNORMAL LOW (ref >60)
GLUCOSE: 119 mg/dL — AB (ref 65–99)
Potassium: 3 mmol/L — ABNORMAL LOW (ref 3.5–5.1)
Sodium: 144 mmol/L (ref 135–145)

## 2017-02-10 LAB — HEPARIN LEVEL (UNFRACTIONATED): HEPARIN UNFRACTIONATED: 0.66 [IU]/mL (ref 0.30–0.70)

## 2017-02-10 LAB — MAGNESIUM: Magnesium: 1.4 mg/dL — ABNORMAL LOW (ref 1.7–2.4)

## 2017-02-10 MED ORDER — ADULT MULTIVITAMIN W/MINERALS CH
1.0000 | ORAL_TABLET | Freq: Every day | ORAL | Status: DC
  Administered 2017-02-10 – 2017-02-20 (×9): 1 via ORAL
  Filled 2017-02-10 (×10): qty 1

## 2017-02-10 MED ORDER — DILTIAZEM HCL 30 MG PO TABS
30.0000 mg | ORAL_TABLET | Freq: Three times a day (TID) | ORAL | Status: DC
  Administered 2017-02-10 – 2017-02-11 (×3): 30 mg via ORAL
  Filled 2017-02-10 (×3): qty 1

## 2017-02-10 MED ORDER — LOSARTAN POTASSIUM 25 MG PO TABS
25.0000 mg | ORAL_TABLET | Freq: Every day | ORAL | Status: DC
  Administered 2017-02-10 – 2017-02-12 (×3): 25 mg via ORAL
  Filled 2017-02-10 (×3): qty 1

## 2017-02-10 MED ORDER — MAGNESIUM SULFATE 2 GM/50ML IV SOLN
2.0000 g | Freq: Once | INTRAVENOUS | Status: AC
  Administered 2017-02-10: 2 g via INTRAVENOUS
  Filled 2017-02-10: qty 50

## 2017-02-10 MED ORDER — APIXABAN 2.5 MG PO TABS
2.5000 mg | ORAL_TABLET | Freq: Two times a day (BID) | ORAL | Status: DC
  Administered 2017-02-10 – 2017-02-14 (×10): 2.5 mg via ORAL
  Filled 2017-02-10 (×11): qty 1

## 2017-02-10 MED ORDER — LOSARTAN POTASSIUM 25 MG PO TABS: 25.0000 mg | ORAL_TABLET | Freq: Every day | ORAL | Status: DC

## 2017-02-10 MED ORDER — AZITHROMYCIN 250 MG PO TABS
500.0000 mg | ORAL_TABLET | Freq: Every day | ORAL | Status: DC
  Administered 2017-02-10: 500 mg via ORAL
  Filled 2017-02-10: qty 2

## 2017-02-10 MED ORDER — POTASSIUM CHLORIDE CRYS ER 20 MEQ PO TBCR
40.0000 meq | EXTENDED_RELEASE_TABLET | ORAL | Status: AC
  Administered 2017-02-10 (×2): 40 meq via ORAL
  Filled 2017-02-10 (×2): qty 2

## 2017-02-10 MED ORDER — ENSURE ENLIVE PO LIQD
237.0000 mL | ORAL | Status: DC
  Administered 2017-02-10 – 2017-02-20 (×6): 237 mL via ORAL

## 2017-02-10 MED ORDER — METOPROLOL TARTRATE 25 MG PO TABS
12.5000 mg | ORAL_TABLET | Freq: Two times a day (BID) | ORAL | Status: DC
  Administered 2017-02-10 – 2017-02-11 (×3): 12.5 mg via ORAL
  Filled 2017-02-10 (×3): qty 1

## 2017-02-10 MED ORDER — BOOST / RESOURCE BREEZE PO LIQD
1.0000 | Freq: Two times a day (BID) | ORAL | Status: DC
  Administered 2017-02-10 – 2017-02-20 (×13): 1 via ORAL

## 2017-02-10 NOTE — Discharge Instructions (Addendum)

## 2017-02-10 NOTE — Progress Notes (Addendum)
Initial Nutrition Assessment  DOCUMENTATION CODES:   Non-severe (moderate) malnutrition in context of chronic illness  INTERVENTION:  - Will order Ensure Enlive once/day, this supplement provides 350 kcal and 20 grams of protein - Will order Boost Breeze BID, each supplement provides 250 kcal and 9 grams of protein - Will order daily multivitamin with minerals.  - Encourage PO intakes of meals and supplements.   Monitor magnesium, potassium, and phosphorus daily for at least 3 days, MD to replete as needed, as pt is at risk for refeeding syndrome given malnutrition, pt report of decreased appetite and intakes for several months which has much improved since hospitalization (increased intakes), and current hypokalemia and hypomagnesemia.   NUTRITION DIAGNOSIS:   Malnutrition (moderate/non-severe) related to chronic illness (osteoarthritis) as evidenced by severe depletion of muscle mass, mild depletion of body fat.  GOAL:   Patient will meet greater than or equal to 90% of their needs  MONITOR:   PO intake, Supplement acceptance, Weight trends, Labs  REASON FOR ASSESSMENT:   Malnutrition Screening Tool  ASSESSMENT:   83 y.o. female with medical history significant for hypertension, osteoarthritis, and history of cardiac arrhythmia, now presenting to the emergency department for evaluation of generalized weakness and malaise. Patient was accompanied by her caretaker, with whom she lives, reporting that the patient has been experiencing increasing generalized weakness over the past couple days, much worse today.  Pt seen for MST. BMI indicates normal weight. Per chart review, pt consumed 50% of lunch and 100% of dinner yesterday. She reports that appetite has been improved since hospitalization and that she was able to eat 100% of breakfast this AM (pancakes, applesauce, milk, and coffee). She states that PTA her appetite had been decreased all summer. She states that back in May she  was having intermittent diarrhea with meals but that she had not had a change in medications around that time and was not eating anything that she had not previously. She states that this occurred for ~3 weeks and has not happened since. She denies abdominal pain or nausea with PO intakes since admission.  She has caregivers at home who prepare foods for her or she consumes frozen meals, such as [employer]. Manager for home care agency at bedside shortly after RD visit and RD overheard her tell pt that she is going to talk with caregivers about making more nutritious meals for pt, meals that are lower in sodium/salt.   Physical assessment done to upper body only and shows mild/moderate fat wasting to upper arm, moderate muscle wasting to temple area, severe muscle wasting to shoulder and clavicle areas. Per chart review, weight has been stable for the past year: weight on 02/11/16 was 112 lbs.   Medications reviewed; 40 mg IV Lasix BID, 2 g IV Mg sulfate x1 run today, 40 mEq oral KCl x2 doses today.  Labs reviewed; K: 3 mmol/L, Mg: 1.4 mg/dL, BUN: 48 mg/dL, GFR: 56 mL/min.      Diet Order:  Diet Heart Room service appropriate? Yes; Fluid consistency: Thin  Skin:  Reviewed, no issues  Last BM:  9/10 (PTA)  Height:   Ht Readings from Last 1 Encounters:  02/08/17 5' (1.524 m)    Weight:   Wt Readings from Last 1 Encounters:  02/10/17 113 lb 1.5 oz (51.3 kg)    Ideal Body Weight:  45.45 kg  BMI:  Body mass index is 22.09 kg/m.  Estimated Nutritional Needs:   Kcal:  1280-1440 (25-28 kcal/kg)  Protein:  45-55 grams  Fluid:  >/= 1.3 L/day  EDUCATION NEEDS:   No education needs identified at this time     Trenton Gammon, MS, RD, LDN, [facility] Inpatient Clinical Dietitian Pager # [phone] After hours/weekend pager # [phone]

## 2017-02-10 NOTE — Progress Notes (Signed)
ANTICOAGULATION CONSULT NOTE - Follow Up Consult  Pharmacy Consult for Heparin Indication: chest pain/ACS and atrial fibrillation  Allergies  Allergen Reactions  . Aspirin     nosebleed    Patient Measurements: Height: 5' (152.4 cm) Weight: 113 lb 1.5 oz (51.3 kg) IBW/kg (Calculated) : 45.5 Heparin Dosing Weight:   Vital Signs: Temp: 97.3 F (36.3 C) (09/14 0400) Temp Source: Oral (09/14 0400) BP: 143/70 (09/14 0400) Pulse Rate: 59 (09/14 0400)  Labs:  Recent Labs  02/08/17 1741 02/08/17 2248 02/09/17 0202 02/09/17 0822 02/09/17 1348 02/09/17 1541 02/10/17 0122  HGB 17.5*  --  17.7*  --   --   --  16.8*  HCT 52.8*  --  53.9*  --   --   --  50.9*  PLT 120*  --  101*  --   --   --  115*  APTT  --  28  --   --   --   --   --   LABPROT  --  15.9*  --   --   --   --   --   INR  --  1.29  --   --   --   --   --   HEPARINUNFRC  --   --   --  0.31  --  0.12* 0.66  CREATININE 1.23*  --  1.07*  --   --   --  0.92  TROPONINI  --   --  0.32* 0.25* 0.26*  --   --     Estimated Creatinine Clearance: 32.7 mL/min (by C-G formula based on SCr of 0.92 mg/dL).   Medications:  Infusions:  . azithromycin Stopped (02/09/17 2232)  . cefTRIAXone (ROCEPHIN)  IV Stopped (02/09/17 2201)  . diltiazem (CARDIZEM) infusion Stopped (02/09/17 0641)  . heparin 750 Units/hr (02/10/17 0400)    Assessment: Patient with heparin level at goal.  No heparin issues noted.  Goal of Therapy:  Heparin level 0.3-0.7 units/ml Monitor platelets by anticoagulation protocol: Yes   Plan:  Continue heparin drip at current rate Recheck level at 0900  Darlina Guys, [place] Crowford 02/10/2017,5:09 AM

## 2017-02-10 NOTE — Progress Notes (Signed)
Ref: Renford Dills, MD   Subjective:  Feeling better. Heart rate control improved. Afebrile. She is on Apixaban. Tolerating Metoprolol and losartan.  Objective:  Vital Signs in the last 24 hours: Temp:  [97.3 F (36.3 C)-98.4 F (36.9 C)] 98.4 F (36.9 C) (09/14 1452) Pulse Rate:  [50-89] 89 (09/14 1452) Cardiac Rhythm: Atrial fibrillation (09/14 0800) Resp:  [1-41] 20 (09/14 1452) BP: (108-202)/(59-114) 141/71 (09/14 1452) SpO2:  [90 %-98 %] 98 % (09/14 1452) Weight:  [51.3 kg (113 lb 1.5 oz)] 51.3 kg (113 lb 1.5 oz) (09/14 0400)  Physical Exam: BP Readings from Last 1 Encounters:  02/10/17 (!) 141/71    Wt Readings from Last 1 Encounters:  02/10/17 51.3 kg (113 lb 1.5 oz)    Weight change: 0.497 kg (1 lb 1.5 oz) Body mass index is 22.09 kg/m. HEENT: [place]/AT, Eyes-Blue, PERL, EOMI, Conjunctiva-Pink, Sclera-Non-icteric Neck: No JVD, No bruit, Trachea midline. Lungs:  Clear, Bilateral. Cardiac:  Regular rhythm, normal S1 and S2, no S3. II/VI systolic murmur. Abdomen:  Soft, non-tender. BS present. Extremities:  No edema present. No cyanosis. No clubbing. CNS: AxOx3, Cranial nerves grossly intact, moves all 4 extremities.  Skin: Warm and dry.   Intake/Output from previous day: 09/13 0701 - 09/14 0700 In: 997.6 [P.O.:600; I.V.:97.6; IV Piggyback:300] Out: 1920 [Urine:1920]    Lab Results: BMET    Component Value Date/Time   NA 144 02/10/2017 0122   NA 146 (H) 02/09/2017 0202   NA 145 02/08/2017 1741   K 3.0 (L) 02/10/2017 0122   K 4.0 02/09/2017 0202   K 3.8 02/08/2017 1741   CL 105 02/10/2017 0122   CL 108 02/09/2017 0202   CL 107 02/08/2017 1741   CO2 27 02/10/2017 0122   CO2 27 02/09/2017 0202   CO2 27 02/08/2017 1741   GLUCOSE 119 (H) 02/10/2017 0122   GLUCOSE 103 (H) 02/09/2017 0202   GLUCOSE 110 (H) 02/08/2017 1741   BUN 48 (H) 02/10/2017 0122   BUN 61 (H) 02/09/2017 0202   BUN 62 (H) 02/08/2017 1741   CREATININE 0.92 02/10/2017 0122   CREATININE  1.07 (H) 02/09/2017 0202   CREATININE 1.23 (H) 02/08/2017 1741   CALCIUM 9.1 02/10/2017 0122   CALCIUM 9.7 02/09/2017 0202   CALCIUM 9.8 02/08/2017 1741   GFRNONAA 56 (L) 02/10/2017 0122   GFRNONAA 46 (L) 02/09/2017 0202   GFRNONAA 39 (L) 02/08/2017 1741   GFRAA >60 02/10/2017 0122   GFRAA 54 (L) 02/09/2017 0202   GFRAA 45 (L) 02/08/2017 1741   CBC    Component Value Date/Time   WBC 9.3 02/10/2017 0122   RBC 6.19 (H) 02/10/2017 0122   HGB 16.8 (H) 02/10/2017 0122   HCT 50.9 (H) 02/10/2017 0122   PLT 115 (L) 02/10/2017 0122   MCV 82.2 02/10/2017 0122   MCH 27.1 02/10/2017 0122   MCHC 33.0 02/10/2017 0122   RDW 17.1 (H) 02/10/2017 0122   LYMPHSABS 1.3 02/10/2017 0122   MONOABS 0.9 02/10/2017 0122   EOSABS 0.0 02/10/2017 0122   BASOSABS 0.0 02/10/2017 0122   HEPATIC Function Panel  Recent Labs  02/08/17 2043  PROT 5.7*   HEMOGLOBIN A1C No components found for: HGA1C,  MPG CARDIAC ENZYMES Lab Results  Component Value Date   CKTOTAL 673 (H) 02/12/2016   TROPONINI 0.26 (HH) 02/09/2017   TROPONINI 0.25 (HH) 02/09/2017   TROPONINI 0.32 (HH) 02/09/2017   BNP No results for input(s): PROBNP in the last 8760 hours. TSH No results for input(s):  TSH in the last 8760 hours. CHOLESTEROL No results for input(s): CHOL in the last 8760 hours.  Scheduled Meds: . apixaban  2.5 mg Oral BID  . atorvastatin  40 mg Oral q1800  . azithromycin  500 mg Oral QHS  . diltiazem  30 mg Oral Q8H  . feeding supplement  1 Container Oral BID BM  . feeding supplement (ENSURE ENLIVE)  237 mL Oral Q24H  . furosemide  40 mg Intravenous Q12H  . losartan  25 mg Oral Daily  . metoprolol tartrate  12.5 mg Oral BID  . multivitamin with minerals  1 tablet Oral Daily   Continuous Infusions: . cefTRIAXone (ROCEPHIN)  IV Stopped (02/09/17 2201)   PRN Meds:.acetaminophen, [START ON 02/13/2017] Influenza vac split quadrivalent PF, nitroGLYCERIN, ondansetron (ZOFRAN) IV  Assessment/Plan: Acute left  heart systolic failure Atrial fibrillation, CHA2DS2VASc score of 5 Abnormal troponin-I from demand ischemia Ischemic cardiomyopathy Mild to moderate AI Left atrial thrombus/Tumor Right lung pneumonia Hypertension CKD, II  Continue medical treatment. Increase activity. Home soon. F/U in 1 week at office.   LOS: 2 days    [name]  MD  02/10/2017, 4:15 PM

## 2017-02-10 NOTE — Progress Notes (Signed)
ANTICOAGULATION CONSULT NOTE   Pharmacy Consult for Heparin-->Eliquis Indication: atrial fibrillation  Allergies  Allergen Reactions  . Aspirin     nosebleed   Patient Measurements: Height: 5' (152.4 cm) Weight: 113 lb 1.5 oz (51.3 kg) IBW/kg (Calculated) : 45.5  Vital Signs: Temp: 97.3 F (36.3 C) (09/14 0400) Temp Source: Oral (09/14 0400) BP: 172/80 (09/14 0608) Pulse Rate: 83 (09/14 0608)  Labs:  Recent Labs  02/08/17 1741 02/08/17 2248 02/09/17 0202 02/09/17 0822 02/09/17 1348 02/09/17 1541 02/10/17 0122  HGB 17.5*  --  17.7*  --   --   --  16.8*  HCT 52.8*  --  53.9*  --   --   --  50.9*  PLT 120*  --  101*  --   --   --  115*  APTT  --  28  --   --   --   --   --   LABPROT  --  15.9*  --   --   --   --   --   INR  --  1.29  --   --   --   --   --   HEPARINUNFRC  --   --   --  0.31  --  0.12* 0.66  CREATININE 1.23*  --  1.07*  --   --   --  0.92  TROPONINI  --   --  0.32* 0.25* 0.26*  --   --     Estimated Creatinine Clearance: 32.7 mL/min (by C-G formula based on SCr of 0.92 mg/dL).   Medications:  Infusions:  . azithromycin Stopped (02/09/17 2232)  . cefTRIAXone (ROCEPHIN)  IV Stopped (02/09/17 2201)    Assessment: 83 yo F currently on IV heparin for ACS/Afib with plans to bridge to Eliquis today.   She meets criteria for reduced dose: age>80, wt<60kg. CBC stable, no bleeding noted.  Patient is also currently on low-dose aspirin  Plan:  D/C heparin & heparin labs Start Eliquis 2.5mg  PO BID Consider stopping aspirin while on Eliquis Provide patient education  Elson Clan 02/10/2017,8:07 AM

## 2017-02-10 NOTE — Progress Notes (Signed)
PHARMACIST - PHYSICIAN COMMUNICATION DR:  Alvino Chapel CONCERNING: Antibiotic IV to Oral Route Change Policy  RECOMMENDATION: This patient is receiving Zithromax by the intravenous route.  Based on criteria approved by the Pharmacy and Therapeutics Committee, the antibiotic(s) is/are being converted to the equivalent oral dose form(s).   DESCRIPTION: These criteria include:  Patient being treated for a respiratory tract infection, urinary tract infection, cellulitis or clostridium difficile associated diarrhea if on metronidazole  The patient is not neutropenic and does not exhibit a GI malabsorption state  The patient is eating (either orally or via tube) and/or has been taking other orally administered medications for a least 24 hours  The patient is improving clinically and has a Tmax < 100.5  If you have questions about this conversion, please contact the Pharmacy Department  []   [phone] )  [facility] []   [phone] )  [facility] []   [phone] )  [facility] []   [phone] )  [facility] [x]   [phone] )  [facility]   Junita Push, PharmD, BCPS 02/10/2017@8 :25 AM

## 2017-02-10 NOTE — Progress Notes (Signed)
PROGRESS NOTE    KANNON BAUM  ZOX:[id number] DOB: [birth year] DOA: 02/08/2017 PCP: Renford Dills, MD     Brief Narrative:  Rachel [name] is a 83 y.o. female with medical history significant for hypertension, osteoarthritis, and history of cardiac arrhythmia, now presenting to the emergency department for evaluation of generalized weakness and malaise. Patient was accompanied by her caretaker, with whom she lives, reporting that the patient has been experiencing increasing generalized weakness over the past couple days, much worse today. Patient was seated on the toilet, but was too weak to get up, prompting the caretaker to call EMS for evaluation. Patient required significant assistance just to stand and was brought into the ED for evaluation. In the ED, she was found to be in A Fib with tachycardia, started on cardizem gtt. She was also found to be fluid overloaded with BNP 3637 and was given IV lasix. She underwent echocardiogram which showed reduced systolic function EF 25% along with severe hypokinesis of anteroseptal and anterior myocardium. Also found a small thrombus in the left atrium. Cardiology was consulted.   Assessment & Plan:   Principal Problem:   New onset atrial fibrillation (HCC) Active Problems:   HTN (hypertension)   CAP (community acquired pneumonia)   Renal insufficiency   NSTEMI (non-ST elevated myocardial infarction) (HCC)   Acute CHF (congestive heart failure) (HCC)   General weakness   New onset A fib with RVR  -CHADS-VASc is at least 4 -Start eliquis -Rate well controlled on oral cardizem. Metoprolol added   Acute systolic and diastolic CHF -BNP 3637 on admission -Cardiology following  -Lasix IV  BID, started cozaar and lopressor  -Strict I/O, daily weight  Elevated troponin -Could be due to demand ischemia in setting of infection, A Fib, CHF -0.25 --> 0.32 --> 0.25 -Not a candidate for heart cath   Right upper lobar pneumonia -Continue  rocephin, azithromax -Blood cultures negative to date   AKI on CKD stage 3  -Baseline Cr 0.6  -Trend BMP, stable currently   Hypokalemia -Replace, trend  Hypomagnesemia -Replace, trend     DVT prophylaxis: Eliquis  Code Status: DNR Family Communication: No family at bedside Disposition Plan: Transfer to tele today, PT consulted    Consultants:   Cardiology  Procedures:   None  Antimicrobials:  Anti-infectives    Start     Dose/Rate Route Frequency Ordered Stop   02/10/17 2200  azithromycin (ZITHROMAX) tablet 500 mg     500 mg Oral Daily at bedtime 02/10/17 0826 02/15/17 2159   02/09/17 2200  cefTRIAXone (ROCEPHIN) 1 g in dextrose 5 % 50 mL IVPB     1 g 100 mL/hr over 30 Minutes Intravenous Daily at bedtime 02/08/17 2243 02/15/17 2159   02/09/17 2200  azithromycin (ZITHROMAX) 500 mg in dextrose 5 % 250 mL IVPB  Status:  Discontinued     500 mg 250 mL/hr over 60 Minutes Intravenous Every 24 hours 02/08/17 2243 02/10/17 0826   02/08/17 2230  cefTRIAXone (ROCEPHIN) 1 g in dextrose 5 % 50 mL IVPB     1 g 100 mL/hr over 30 Minutes Intravenous  Once 02/08/17 2227 02/09/17 0123   02/08/17 2230  azithromycin (ZITHROMAX) 500 mg in dextrose 5 % 250 mL IVPB     500 mg 250 mL/hr over 60 Minutes Intravenous  Once 02/08/17 2227 02/09/17 0307       Subjective: Feeling well this morning. No complaints of chest pain, shortness of breath. No nausea, vomiting.  Objective: Vitals:   02/10/17 0700 02/10/17 0800 02/10/17 0900 02/10/17 1000  BP: (!) 158/85 (!) 131/59 (!) 158/64 (!) 149/114  Pulse: 71 64 68 65  Resp: (!) 33 20 (!) 33 (!) 26  Temp:  (!) 97.3 F (36.3 C)    TempSrc:  Oral    SpO2: 90% 96% 97% 96%  Weight:      Height:        Intake/Output Summary (Last 24 hours) at 02/10/17 1041 Last data filed at 02/10/17 1000  Gross per 24 hour  Intake          1047.63 ml  Output             1920 ml  Net          -872.37 ml   Filed Weights   02/08/17 1654 02/09/17  0359 02/10/17 0400  Weight: 50.8 kg (112 lb) 52.8 kg (116 lb 6.5 oz) 51.3 kg (113 lb 1.5 oz)    Examination:  General exam: Appears calm and comfortable  Respiratory system: Diminished breath sounds. Respiratory effort normal. Cardiovascular system: S1 & S2 heard, Irregular rhythm rate 60s. No JVD, murmurs, rubs, gallops or clicks. +1 pedal edema. Gastrointestinal system: Abdomen is nondistended, soft and nontender. No organomegaly or masses felt. Normal bowel sounds heard. Central nervous system: Alert and oriented. No focal neurological deficits. Extremities: Symmetric 5 x 5 power. Skin: No rashes, lesions or ulcers Psychiatry: Judgement and insight appear normal. Mood & affect appropriate.   Data Reviewed: I have personally reviewed following labs and imaging studies  CBC:  Recent Labs Lab 02/08/17 1741 02/09/17 0202 02/10/17 0122  WBC 12.1* 10.1 9.3  NEUTROABS  --   --  7.1  HGB 17.5* 17.7* 16.8*  HCT 52.8* 53.9* 50.9*  MCV 81.4 82.8 82.2  PLT 120* 101* 115*   Basic Metabolic Panel:  Recent Labs Lab 02/08/17 1741 02/08/17 2043 02/09/17 0202 02/10/17 0122  NA 145  --  146* 144  K 3.8  --  4.0 3.0*  CL 107  --  108 105  CO2 27  --  27 27  GLUCOSE 110*  --  103* 119*  BUN 62*  --  61* 48*  CREATININE 1.23*  --  1.07* 0.92  CALCIUM 9.8  --  9.7 9.1  MG  --  1.8  --  1.4*   GFR: Estimated Creatinine Clearance: 32.7 mL/min (by C-G formula based on SCr of 0.92 mg/dL). Liver Function Tests:  Recent Labs Lab 02/08/17 2043  AST 45*  ALT 28  ALKPHOS 80  BILITOT 1.2  PROT 5.7*  ALBUMIN 3.7   No results for input(s): LIPASE, AMYLASE in the last 168 hours. No results for input(s): AMMONIA in the last 168 hours. Coagulation Profile:  Recent Labs Lab 02/08/17 2248  INR 1.29   Cardiac Enzymes:  Recent Labs Lab 02/09/17 0202 02/09/17 0822 02/09/17 1348  TROPONINI 0.32* 0.25* 0.26*   BNP (last 3 results) No results for input(s): PROBNP in the last  8760 hours. HbA1C: No results for input(s): HGBA1C in the last 72 hours. CBG:  Recent Labs Lab 02/08/17 2104  GLUCAP 103*   Lipid Profile: No results for input(s): CHOL, HDL, LDLCALC, TRIG, CHOLHDL, LDLDIRECT in the last 72 hours. Thyroid Function Tests: No results for input(s): TSH, T4TOTAL, FREET4, T3FREE, THYROIDAB in the last 72 hours. Anemia Panel: No results for input(s): VITAMINB12, FOLATE, FERRITIN, TIBC, IRON, RETICCTPCT in the last 72 hours. Sepsis Labs: No results for input(s): PROCALCITON,  LATICACIDVEN in the last 168 hours.  Recent Results (from the past 240 hour(s))  Culture, blood (routine x 2)     Status: None (Preliminary result)   Collection Time: 02/08/17 10:34 PM  Result Value Ref Range Status   Specimen Description BLOOD RIGHT WRIST  Final   Special Requests   Final    BOTTLES DRAWN AEROBIC ONLY Blood Culture adequate volume   Culture   Final    NO GROWTH 1 DAY Performed at [facility], [address], [place], [place] [address]    Report Status PENDING  Incomplete  MRSA PCR Screening     Status: None   Collection Time: 02/09/17 12:09 AM  Result Value Ref Range Status   MRSA by PCR NEGATIVE NEGATIVE Final    Comment:        The GeneXpert MRSA Assay (FDA approved for NASAL specimens only), is one component of a comprehensive MRSA colonization surveillance program. It is not intended to diagnose MRSA infection nor to guide or monitor treatment for MRSA infections.   Culture, blood (routine x 2)     Status: None (Preliminary result)   Collection Time: 02/09/17 12:44 AM  Result Value Ref Range Status   Specimen Description BLOOD LEFT HAND  Final   Special Requests IN PEDIATRIC BOTTLE Blood Culture adequate volume  Final   Culture   Final    NO GROWTH 1 DAY Performed at [facility], [address], [place], [place] [address]    Report Status PENDING  Incomplete       Radiology Studies: Dg Chest 2 View  Result Date:  02/08/2017 CLINICAL DATA:  Week and not eating. EXAM: CHEST  2 VIEW COMPARISON:  February 11, 2016 FINDINGS: The mediastinal contour is normal. The heart size is enlarged. Patchy consolidation is identified in the right perihilar region and right upper lobe. There is no focal pneumonia in they left lung. There are small bilateral pleural effusions. The right hemidiaphragm is elevated. The bony structures are stable. IMPRESSION: Right upper lobe pneumonia. Follow-up chest x-ray after treatment is recommended to ensure complete resolution and to ensure no underlying mass is present. Small bilateral pleural effusions. Electronically Signed   By: Sherian Rein M.D.   On: 02/08/2017 22:00      Scheduled Meds: . apixaban  2.5 mg Oral BID  . atorvastatin  40 mg Oral q1800  . azithromycin  500 mg Oral QHS  . diltiazem  30 mg Oral Q8H  . furosemide  40 mg Intravenous Q12H  . losartan  25 mg Oral Daily  . metoprolol tartrate  12.5 mg Oral BID  . potassium chloride  40 mEq Oral Q4H   Continuous Infusions: . cefTRIAXone (ROCEPHIN)  IV Stopped (02/09/17 2201)     LOS: 2 days    Time spent: 30 minutes   Noralee Stain, DO [facility] [email or web address] Password [facility] 02/10/2017, 10:41 AM

## 2017-02-10 NOTE — Evaluation (Signed)
Physical Therapy Evaluation Patient Details Name: Rachel [name] MRN: [id number] DOB: 1930-08-22 Today's Date: 02/10/2017   History of Present Illness  83 yo female admitted with A fib, Pna, CHF. Hx of L hip hemi-posterior 2017, OA, arrythmia, HTN  Clinical Impression  On eval, pt required Total assist for bed mobility. She puts forth effort but she is very weak currently. She sat EOB ~ 5 minutes with Min-Mod assist for sitting balance. Pt presents with general weakness, decreased activity tolerance, and impaired gait and balance. Discussed d/c plan-pt stated she plans to return home with 24 hour care. At this time, recommendation is for ST rehab at SNF, if pt is agreeable. If she returns home, she will absolutely require 24/7 supervision/assist. Will follow and progress activity as tolerated.     Follow Up Recommendations SNF (unless pt can arrange for 24 hour care at home. Pt may refuse SNF. )    Equipment Recommendations  None recommended by PT    Recommendations for Other Services       Precautions / Restrictions Precautions Precautions: Fall Restrictions Weight Bearing Restrictions: No      Mobility  Bed Mobility Overal bed mobility: Needs Assistance Bed Mobility: Supine to Sit;Sit to Supine     Supine to sit: HOB elevated;Total assist Sit to supine: HOB elevated;Total assist   General bed mobility comments: Assist for trunk and LEs. Utilized bedpad for scooting, positioning. Increased time.   Transfers                 General transfer comment: NT-pt declined OOB activity. Also +2 assist not available  Ambulation/Gait                Stairs            Wheelchair Mobility    Modified Rankin (Stroke Patients Only)       Balance Overall balance assessment: Needs assistance;History of Falls Sitting-balance support: Feet supported Sitting balance-Leahy Scale: Poor Sitting balance - Comments: Min-Mod assist for static sitting balance.   Postural control: Posterior lean                                   Pertinent Vitals/Pain Pain Assessment: Faces Faces Pain Scale: Hurts even more Pain Location: back, bottom, heels Pain Descriptors / Indicators: Sore;Aching Pain Intervention(s): Limited activity within patient's tolerance;Repositioned    Home Living Family/patient expects to be discharged to:: Unsure Living Arrangements: Alone Available Help at Discharge: Personal care attendant (1230-330) Type of Home: House         Home Equipment: Dan Humphreys - 2 wheels;Cane - single point      Prior Function Level of Independence: Needs assistance   Gait / Transfers Assistance Needed: ambulatory with a RW-household distances per pt  ADL's / Homemaking Assistance Needed: aide assists with household tasks        Hand Dominance        Extremity/Trunk Assessment   Upper Extremity Assessment Upper Extremity Assessment: Generalized weakness    Lower Extremity Assessment Lower Extremity Assessment: Generalized weakness    Cervical / Trunk Assessment Cervical / Trunk Assessment: Kyphotic  Communication   Communication: HOH  Cognition Arousal/Alertness: Awake/alert Behavior During Therapy: WFL for tasks assessed/performed Overall Cognitive Status: Within Functional Limits for tasks assessed  General Comments      Exercises General Exercises - Lower Extremity Ankle Circles/Pumps: AROM;Both;Seated Long Arc Quad: AROM;Both;5 reps;Seated   Assessment/Plan    PT Assessment Patient needs continued PT services  PT Problem List Decreased strength;Decreased mobility;Decreased activity tolerance;Decreased balance;Decreased knowledge of use of DME;Pain       PT Treatment Interventions DME instruction;Gait training;Therapeutic activities;Therapeutic exercise;Patient/family education;Balance training;Functional mobility training    PT Goals (Current  goals can be found in the Care Plan section)  Acute Rehab PT Goals Patient Stated Goal: home PT Goal Formulation: With patient Time For Goal Achievement: 02/24/17 Potential to Achieve Goals: Fair    Frequency Min 3X/week   Barriers to discharge        Co-evaluation               AM-PAC PT "6 Clicks" Daily Activity  Outcome Measure Difficulty turning over in bed (including adjusting bedclothes, sheets and blankets)?: Unable Difficulty moving from lying on back to sitting on the side of the bed? : Unable Difficulty sitting down on and standing up from a chair with arms (e.g., wheelchair, bedside commode, etc,.)?: Unable Help needed moving to and from a bed to chair (including a wheelchair)?: Total Help needed walking in hospital room?: Total Help needed climbing 3-5 steps with a railing? : Total 6 Click Score: 6    End of Session   Activity Tolerance: Patient limited by fatigue (Limited by weakness) Patient left: in bed;with call bell/phone within reach;with bed alarm set   PT Visit Diagnosis: Muscle weakness (generalized) (M62.81);Difficulty in walking, not elsewhere classified (R26.2)    Time: 1430-1446 PT Time Calculation (min) (ACUTE ONLY): 16 min   Charges:   PT Evaluation $PT Eval Moderate Complexity: 1 Mod     PT G Codes:          Rebeca Alert, MPT Pager: [phone]

## 2017-02-11 ENCOUNTER — Encounter (HOSPITAL_COMMUNITY): Payer: Self-pay

## 2017-02-11 LAB — BASIC METABOLIC PANEL
Anion gap: 8 (ref 5–15)
BUN: 43 mg/dL — AB (ref 6–20)
CO2: 30 mmol/L (ref 22–32)
CREATININE: 1 mg/dL (ref 0.44–1.00)
Calcium: 9 mg/dL (ref 8.9–10.3)
Chloride: 104 mmol/L (ref 101–111)
GFR calc Af Amer: 58 mL/min — ABNORMAL LOW (ref >60)
GFR calc non Af Amer: 50 mL/min — ABNORMAL LOW (ref >60)
Glucose, Bld: 99 mg/dL (ref 65–99)
POTASSIUM: 4.5 mmol/L (ref 3.5–5.1)
SODIUM: 142 mmol/L (ref 135–145)

## 2017-02-11 LAB — CBC WITH DIFFERENTIAL/PLATELET
Basophils Absolute: 0 10*3/uL (ref 0.0–0.1)
Basophils Relative: 0 %
EOS ABS: 0.1 10*3/uL (ref 0.0–0.7)
EOS PCT: 1 %
HCT: 51.7 % — ABNORMAL HIGH (ref 36.0–46.0)
HEMOGLOBIN: 16.8 g/dL — AB (ref 12.0–15.0)
LYMPHS PCT: 14 %
Lymphs Abs: 1.3 10*3/uL (ref 0.7–4.0)
MCH: 26.3 pg (ref 26.0–34.0)
MCHC: 32.5 g/dL (ref 30.0–36.0)
MCV: 81 fL (ref 78.0–100.0)
Monocytes Absolute: 0.9 10*3/uL (ref 0.1–1.0)
Monocytes Relative: 10 %
NEUTROS PCT: 75 %
Neutro Abs: 7.1 10*3/uL (ref 1.7–7.7)
PLATELETS: 122 10*3/uL — AB (ref 150–400)
RBC: 6.38 MIL/uL — AB (ref 3.87–5.11)
RDW: 16.7 % — ABNORMAL HIGH (ref 11.5–15.5)
WBC: 9.4 10*3/uL (ref 4.0–10.5)

## 2017-02-11 LAB — MAGNESIUM: Magnesium: 1.7 mg/dL (ref 1.7–2.4)

## 2017-02-11 LAB — TSH: TSH: 2.076 u[IU]/mL (ref 0.350–4.500)

## 2017-02-11 MED ORDER — HYDRALAZINE HCL 20 MG/ML IJ SOLN
10.0000 mg | INTRAMUSCULAR | Status: DC | PRN
  Administered 2017-02-11: 10 mg via INTRAVENOUS
  Filled 2017-02-11: qty 1

## 2017-02-11 MED ORDER — METOPROLOL TARTRATE 25 MG PO TABS
25.0000 mg | ORAL_TABLET | Freq: Two times a day (BID) | ORAL | Status: DC
  Administered 2017-02-11 – 2017-02-14 (×7): 25 mg via ORAL
  Filled 2017-02-11 (×8): qty 1

## 2017-02-11 MED ORDER — CEFUROXIME AXETIL 500 MG PO TABS
500.0000 mg | ORAL_TABLET | Freq: Two times a day (BID) | ORAL | Status: AC
  Administered 2017-02-11 – 2017-02-13 (×5): 500 mg via ORAL
  Filled 2017-02-11 (×5): qty 1

## 2017-02-11 NOTE — Progress Notes (Signed)
PROGRESS NOTE    Rachel [name]  UJW:[id number] DOB: [birth year] DOA: 02/08/2017 PCP: Renford Dills, MD     Brief Narrative:  Rachel [name] is a 83 y.o. female with medical history significant for hypertension, osteoarthritis, and history of cardiac arrhythmia, now presenting to the emergency department for evaluation of generalized weakness and malaise. Patient was accompanied by her caretaker, with whom she lives, reporting that the patient has been experiencing increasing generalized weakness over the past couple days, much worse today. Patient was seated on the toilet, but was too weak to get up, prompting the caretaker to call EMS for evaluation. Patient required significant assistance just to stand and was brought into the ED for evaluation. In the ED, she was found to be in A Fib with tachycardia, started on cardizem gtt. She was also found to be fluid overloaded with BNP 3637 and was given IV lasix. She underwent echocardiogram which showed reduced systolic function EF 25% along with severe hypokinesis of anteroseptal and anterior myocardium. Also found a small thrombus in the left atrium. Cardiology was consulted.   Assessment & Plan:   Principal Problem:   New onset atrial fibrillation (HCC) Active Problems:   HTN (hypertension)   CAP (community acquired pneumonia)   Renal insufficiency   NSTEMI (non-ST elevated myocardial infarction) (HCC)   Acute CHF (congestive heart failure) (HCC)   General weakness   Malnutrition of moderate degree   New onset A fib with RVR  -CHADS-VASc is at least 4 -Started eliquis -Rate well controlled on oral cardizem, metoprolol   Acute systolic and diastolic CHF -BNP 3637 on admission -Cardiology consulted  -Lasix IV  BID, started cozaar and lopressor. Transition to oral lasix tomorrow  -Strict I/O, daily weight  Elevated troponin -Could be due to demand ischemia in setting of infection, A Fib, CHF -0.25 --> 0.32 --> 0.25 -Not a  candidate for heart cath   Right upper lobar pneumonia -Blood cultures negative to date  -Deescalate antibiotics to ceftin today   AKI on CKD stage 3  -Baseline Cr 0.6  -Trend BMP, stable currently   Concern for refeeding syndrome -Continue to monitor K, Mg, Phos another 1-2 days     DVT prophylaxis: Eliquis  Code Status: DNR Family Communication: No family at bedside Disposition Plan: Patient refusing SNF, states she will work on arranging for 24 hour care at home    Consultants:   Cardiology  Procedures:   None  Antimicrobials:  Anti-infectives    Start     Dose/Rate Route Frequency Ordered Stop   02/11/17 1700  cefUROXime (CEFTIN) tablet 500 mg     500 mg Oral 2 times daily with meals 02/11/17 1139     02/10/17 2200  azithromycin (ZITHROMAX) tablet 500 mg  Status:  Discontinued     500 mg Oral Daily at bedtime 02/10/17 0826 02/11/17 1139   02/09/17 2200  cefTRIAXone (ROCEPHIN) 1 g in dextrose 5 % 50 mL IVPB  Status:  Discontinued     1 g 100 mL/hr over 30 Minutes Intravenous Daily at bedtime 02/08/17 2243 02/11/17 1139   02/09/17 2200  azithromycin (ZITHROMAX) 500 mg in dextrose 5 % 250 mL IVPB  Status:  Discontinued     500 mg 250 mL/hr over 60 Minutes Intravenous Every 24 hours 02/08/17 2243 02/10/17 0826   02/08/17 2230  cefTRIAXone (ROCEPHIN) 1 g in dextrose 5 % 50 mL IVPB     1 g 100 mL/hr over 30 Minutes Intravenous  Once  02/08/17 2227 02/09/17 0123   02/08/17 2230  azithromycin (ZITHROMAX) 500 mg in dextrose 5 % 250 mL IVPB     500 mg 250 mL/hr over 60 Minutes Intravenous  Once 02/08/17 2227 02/09/17 0307       Subjective: No complaints this morning, watching the news. She had episode of nonsustained and asymptomatic 7 beat V. Tach this morning. Currently has no complaints of chest pain, heart palpitations or shortness of breath.  Objective: Vitals:   02/10/17 2010 02/11/17 0028 02/11/17 0455 02/11/17 0859  BP: 120/76 (!) 123/99 (!) 175/90 96/60    Pulse: 70 83 94 68  Resp: 20  20   Temp: 97.7 F (36.5 C)  (!) 97.5 F (36.4 C)   TempSrc: Oral  Oral   SpO2: 90% 97% 95%   Weight:   50.4 kg (111 lb 1.8 oz)   Height:        Intake/Output Summary (Last 24 hours) at 02/11/17 1142 Last data filed at 02/11/17 0800  Gross per 24 hour  Intake              720 ml  Output              200 ml  Net              520 ml   Filed Weights   02/09/17 0359 02/10/17 0400 02/11/17 0455  Weight: 52.8 kg (116 lb 6.5 oz) 51.3 kg (113 lb 1.5 oz) 50.4 kg (111 lb 1.8 oz)    Examination:  General exam: Appears calm and comfortable  Respiratory system: Diminished breath sounds. Respiratory effort normal. Cardiovascular system: S1 & S2 heard, Irregular rhythm rate 80s. No JVD, murmurs, rubs, gallops or clicks. +1 pedal edema. Gastrointestinal system: Abdomen is nondistended, soft and nontender. No organomegaly or masses felt. Normal bowel sounds heard. Central nervous system: Alert and oriented. No focal neurological deficits. Extremities: Symmetric 5 x 5 power. Skin: No rashes, lesions or ulcers Psychiatry: Judgement and insight appear normal. Mood & affect appropriate.   Data Reviewed: I have personally reviewed following labs and imaging studies  CBC:  Recent Labs Lab 02/08/17 1741 02/09/17 0202 02/10/17 0122 02/11/17 0454  WBC 12.1* 10.1 9.3 9.4  NEUTROABS  --   --  7.1 7.1  HGB 17.5* 17.7* 16.8* 16.8*  HCT 52.8* 53.9* 50.9* 51.7*  MCV 81.4 82.8 82.2 81.0  PLT 120* 101* 115* 122*   Basic Metabolic Panel:  Recent Labs Lab 02/08/17 1741 02/08/17 2043 02/09/17 0202 02/10/17 0122 02/11/17 0454  NA 145  --  146* 144 142  K 3.8  --  4.0 3.0* 4.5  CL 107  --  108 105 104  CO2 27  --  27 27 30   GLUCOSE 110*  --  103* 119* 99  BUN 62*  --  61* 48* 43*  CREATININE 1.23*  --  1.07* 0.92 1.00  CALCIUM 9.8  --  9.7 9.1 9.0  MG  --  1.8  --  1.4* 1.7   GFR: Estimated Creatinine Clearance: 30.1 mL/min (by C-G formula based on SCr  of 1 mg/dL). Liver Function Tests:  Recent Labs Lab 02/08/17 2043  AST 45*  ALT 28  ALKPHOS 80  BILITOT 1.2  PROT 5.7*  ALBUMIN 3.7   No results for input(s): LIPASE, AMYLASE in the last 168 hours. No results for input(s): AMMONIA in the last 168 hours. Coagulation Profile:  Recent Labs Lab 02/08/17 2248  INR 1.29   Cardiac  Enzymes:  Recent Labs Lab 02/09/17 0202 02/09/17 0822 02/09/17 1348  TROPONINI 0.32* 0.25* 0.26*   BNP (last 3 results) No results for input(s): PROBNP in the last 8760 hours. HbA1C: No results for input(s): HGBA1C in the last 72 hours. CBG:  Recent Labs Lab 02/08/17 2104  GLUCAP 103*   Lipid Profile: No results for input(s): CHOL, HDL, LDLCALC, TRIG, CHOLHDL, LDLDIRECT in the last 72 hours. Thyroid Function Tests:  Recent Labs  02/11/17 0502  TSH 2.076   Anemia Panel: No results for input(s): VITAMINB12, FOLATE, FERRITIN, TIBC, IRON, RETICCTPCT in the last 72 hours. Sepsis Labs: No results for input(s): PROCALCITON, LATICACIDVEN in the last 168 hours.  Recent Results (from the past 240 hour(s))  Culture, blood (routine x 2)     Status: None (Preliminary result)   Collection Time: 02/08/17 10:34 PM  Result Value Ref Range Status   Specimen Description BLOOD RIGHT WRIST  Final   Special Requests   Final    BOTTLES DRAWN AEROBIC ONLY Blood Culture adequate volume   Culture   Final    NO GROWTH 2 DAYS Performed at [facility], [address], [place], [place] [address]    Report Status PENDING  Incomplete  MRSA PCR Screening     Status: None   Collection Time: 02/09/17 12:09 AM  Result Value Ref Range Status   MRSA by PCR NEGATIVE NEGATIVE Final    Comment:        The GeneXpert MRSA Assay (FDA approved for NASAL specimens only), is one component of a comprehensive MRSA colonization surveillance program. It is not intended to diagnose MRSA infection nor to guide or monitor treatment for MRSA infections.    Culture, blood (routine x 2)     Status: None (Preliminary result)   Collection Time: 02/09/17 12:44 AM  Result Value Ref Range Status   Specimen Description BLOOD LEFT HAND  Final   Special Requests IN PEDIATRIC BOTTLE Blood Culture adequate volume  Final   Culture   Final    NO GROWTH 2 DAYS Performed at [facility], [address] [place], [place] [address]    Report Status PENDING  Incomplete       Radiology Studies: No results found.    Scheduled Meds: . apixaban  2.5 mg Oral BID  . atorvastatin  40 mg Oral q1800  . cefUROXime  500 mg Oral BID WC  . diltiazem  30 mg Oral Q8H  . feeding supplement  1 Container Oral BID BM  . feeding supplement (ENSURE ENLIVE)  237 mL Oral Q24H  . furosemide  40 mg Intravenous Q12H  . losartan  25 mg Oral Daily  . metoprolol tartrate  12.5 mg Oral BID  . multivitamin with minerals  1 tablet Oral Daily   Continuous Infusions:    LOS: 3 days    Time spent: 30 minutes   Noralee Stain, DO [facility] [email or web address] Password [facility] 02/11/2017, 11:42 AM

## 2017-02-11 NOTE — Progress Notes (Signed)
Subjective:  Patient denies any chest pain or shortness of breath. Denies any palpitations.  Objective:  Vital Signs in the last 24 hours: Temp:  [97.5 F (36.4 C)-98.4 F (36.9 C)] 97.5 F (36.4 C) (09/15 0455) Pulse Rate:  [68-94] 68 (09/15 0859) Resp:  [20] 20 (09/15 0455) BP: (96-175)/(60-99) 96/60 (09/15 0859) SpO2:  [90 %-98 %] 95 % (09/15 0455) Weight:  [50.4 kg (111 lb 1.8 oz)] 50.4 kg (111 lb 1.8 oz) (09/15 0455)  Intake/Output from previous day: 09/14 0701 - 09/15 0700 In: 50 [IV Piggyback:50] Out: 200 [Urine:200] Intake/Output from this shift: Total I/O In: 720 [P.O.:720] Out: -   Physical Exam: Neck: no adenopathy, no carotid bruit, no JVD and supple, symmetrical, trachea midline Lungs: Decrease breath sounds at bases with occasional rhonchi right lung Heart: irregularly irregular rhythm, S1, S2 normal and 2/6 systolic and diastolic murmur noted Abdomen: soft, non-tender; bowel sounds normal; no masses,  no organomegaly Extremities: extremities normal, atraumatic, no cyanosis or edema  Lab Results:  Recent Labs  02/10/17 0122 02/11/17 0454  WBC 9.3 9.4  HGB 16.8* 16.8*  PLT 115* 122*    Recent Labs  02/10/17 0122 02/11/17 0454  NA 144 142  K 3.0* 4.5  CL 105 104  CO2 27 30  GLUCOSE 119* 99  BUN 48* 43*  CREATININE 0.92 1.00    Recent Labs  02/09/17 0822 02/09/17 1348  TROPONINI 0.25* 0.26*   Hepatic Function Panel  Recent Labs  02/08/17 2043  PROT 5.7*  ALBUMIN 3.7  AST 45*  ALT 28  ALKPHOS 80  BILITOT 1.2  BILIDIR 0.4  IBILI 0.8   No results for input(s): CHOL in the last 72 hours. No results for input(s): PROTIME in the last 72 hours.  Imaging: Imaging results have been reviewed and No results found.  Cardiac Studies:  Assessment/Plan:  Resolving Acute left heart systolic failure Status post Nonsustained VT asymptomatic Atrial fibrillation, CHA2DS2VASc score of 5 Abnormal troponin-I from demand ischemia Ischemic  cardiomyopathy Mild to moderate AI Left atrial thrombus/Tumor Right lung pneumonia Hypertension CKD, II Plan Increase metoprolol as per orders DC Cardizem for now in view of depressed LV systolic function Increase ambulation as tolerated  LOS: 3 days    Rinaldo Cloud 02/11/2017, 11:41 AM

## 2017-02-11 NOTE — Progress Notes (Signed)
Central Monitoring called in regards to VT 7 beats nonstained . Spoke with other RN on the unit. This is not new to patient and pt was asymptomatic and vss. MD on unit and notified

## 2017-02-11 NOTE — Progress Notes (Signed)
Paged Floor Coverage M. Lynch that pt had 5 beat run of vtach, BP 123/99 HR-83. Pt had no complaints.

## 2017-02-12 ENCOUNTER — Encounter (HOSPITAL_COMMUNITY): Payer: Self-pay

## 2017-02-12 LAB — BASIC METABOLIC PANEL
Anion gap: 10 (ref 5–15)
BUN: 40 mg/dL — ABNORMAL HIGH (ref 6–20)
CHLORIDE: 100 mmol/L — AB (ref 101–111)
CO2: 32 mmol/L (ref 22–32)
CREATININE: 0.99 mg/dL (ref 0.44–1.00)
Calcium: 9 mg/dL (ref 8.9–10.3)
GFR calc non Af Amer: 51 mL/min — ABNORMAL LOW (ref >60)
GFR, EST AFRICAN AMERICAN: 59 mL/min — AB (ref >60)
GLUCOSE: 103 mg/dL — AB (ref 65–99)
Potassium: 3.1 mmol/L — ABNORMAL LOW (ref 3.5–5.1)
Sodium: 142 mmol/L (ref 135–145)

## 2017-02-12 LAB — PHOSPHORUS: PHOSPHORUS: 2.5 mg/dL (ref 2.5–4.6)

## 2017-02-12 LAB — MAGNESIUM: Magnesium: 1.6 mg/dL — ABNORMAL LOW (ref 1.7–2.4)

## 2017-02-12 MED ORDER — POTASSIUM CHLORIDE CRYS ER 20 MEQ PO TBCR
40.0000 meq | EXTENDED_RELEASE_TABLET | ORAL | Status: AC
  Administered 2017-02-12: 40 meq via ORAL
  Filled 2017-02-12: qty 2

## 2017-02-12 MED ORDER — MAGNESIUM SULFATE 2 GM/50ML IV SOLN
2.0000 g | Freq: Once | INTRAVENOUS | Status: AC
  Administered 2017-02-12: 2 g via INTRAVENOUS
  Filled 2017-02-12: qty 50

## 2017-02-12 MED ORDER — LOSARTAN POTASSIUM 50 MG PO TABS
50.0000 mg | ORAL_TABLET | Freq: Every day | ORAL | Status: DC
  Administered 2017-02-13 – 2017-02-14 (×2): 50 mg via ORAL
  Filled 2017-02-12 (×3): qty 1

## 2017-02-12 MED ORDER — FUROSEMIDE 10 MG/ML IJ SOLN
40.0000 mg | Freq: Every day | INTRAMUSCULAR | Status: DC
  Administered 2017-02-13 – 2017-02-14 (×2): 40 mg via INTRAVENOUS
  Filled 2017-02-12 (×3): qty 4

## 2017-02-12 NOTE — Progress Notes (Signed)
PROGRESS NOTE    Rachel [name]  KGM:[id number] DOB: 07/06/1930 DOA: 02/08/2017 PCP: Renford Dills, MD     Brief Narrative:  Rachel [name] is a 83 y.o. female with medical history significant for hypertension, osteoarthritis, and history of cardiac arrhythmia, now presenting to the emergency department for evaluation of generalized weakness and malaise. Patient was accompanied by her caretaker, with whom she lives, reporting that the patient has been experiencing increasing generalized weakness over the past couple days, much worse today. Patient was seated on the toilet, but was too weak to get up, prompting the caretaker to call EMS for evaluation. Patient required significant assistance just to stand and was brought into the ED for evaluation. In the ED, she was found to be in A Fib with tachycardia, started on cardizem gtt. She was also found to be fluid overloaded with BNP 3637 and was given IV lasix. She underwent echocardiogram which showed reduced systolic function EF 25% along with severe hypokinesis of anteroseptal and anterior myocardium. Also found a small thrombus in the left atrium. Cardiology was consulted.   Assessment & Plan:   Principal Problem:   New onset atrial fibrillation (HCC) Active Problems:   HTN (hypertension)   CAP (community acquired pneumonia)   Renal insufficiency   NSTEMI (non-ST elevated myocardial infarction) (HCC)   Acute CHF (congestive heart failure) (HCC)   General weakness   Malnutrition of moderate degree   New onset A fib with RVR  -CHADS-VASc is at least 4 -Started eliquis -Rate well controlled on lopressor  Acute systolic and diastolic CHF -BNP 3637 on admission -Cardiology consulted  -Lasix IV 40mg  daily, started cozaar and lopressor -Strict I/O, daily weight  Elevated troponin -Could be due to demand ischemia in setting of infection, A Fib, CHF -0.25 --> 0.32 --> 0.25 -Not a candidate for heart cath   Right upper lobar  pneumonia -Blood cultures negative to date  -Deescalate antibiotics to ceftin   AKI on CKD stage 3  -Baseline Cr 0.6  -Trend BMP, stable currently   Concern for refeeding syndrome -Continue to monitor K, Mg, Phos another 1-2 days   Hypokalemia -Replace, trend  Hypomagnesemia -Replace, trend  PVD -Check ABI today due to worsening cyanosis right great toe     DVT prophylaxis: Eliquis  Code Status: DNR Family Communication: No family at bedside Disposition Plan: Patient refusing SNF, states she will work on arranging for 24 hour care at home    Consultants:   Cardiology  Procedures:   None  Antimicrobials:  Anti-infectives    Start     Dose/Rate Route Frequency Ordered Stop   02/11/17 1700  cefUROXime (CEFTIN) tablet 500 mg     500 mg Oral 2 times daily with meals 02/11/17 1139     02/10/17 2200  azithromycin (ZITHROMAX) tablet 500 mg  Status:  Discontinued     500 mg Oral Daily at bedtime 02/10/17 0826 02/11/17 1139   02/09/17 2200  cefTRIAXone (ROCEPHIN) 1 g in dextrose 5 % 50 mL IVPB  Status:  Discontinued     1 g 100 mL/hr over 30 Minutes Intravenous Daily at bedtime 02/08/17 2243 02/11/17 1139   02/09/17 2200  azithromycin (ZITHROMAX) 500 mg in dextrose 5 % 250 mL IVPB  Status:  Discontinued     500 mg 250 mL/hr over 60 Minutes Intravenous Every 24 hours 02/08/17 2243 02/10/17 0826   02/08/17 2230  cefTRIAXone (ROCEPHIN) 1 g in dextrose 5 % 50 mL IVPB  1 g 100 mL/hr over 30 Minutes Intravenous  Once 02/08/17 2227 02/09/17 0123   02/08/17 2230  azithromycin (ZITHROMAX) 500 mg in dextrose 5 % 250 mL IVPB     500 mg 250 mL/hr over 60 Minutes Intravenous  Once 02/08/17 2227 02/09/17 0307       Subjective: No complaints this morning. Currently has no complaints of chest pain, heart palpitations or shortness of breath.  Objective: Vitals:   02/11/17 2335 02/11/17 2358 02/12/17 0117 02/12/17 0501  BP: (!) 179/97 (!) 178/90 (!) 165/72 (!) 149/86  Pulse:   90  99  Resp:  (!) 26  (!) 24  Temp:    97.6 F (36.4 C)  TempSrc:    Axillary  SpO2:    96%  Weight:    50.4 kg (111 lb 1.8 oz)  Height:        Intake/Output Summary (Last 24 hours) at 02/12/17 1140 Last data filed at 02/12/17 0600  Gross per 24 hour  Intake              240 ml  Output             1500 ml  Net            -1260 ml   Filed Weights   02/10/17 0400 02/11/17 0455 02/12/17 0501  Weight: 51.3 kg (113 lb 1.5 oz) 50.4 kg (111 lb 1.8 oz) 50.4 kg (111 lb 1.8 oz)    Examination:  General exam: Appears calm and comfortable  Respiratory system: Diminished breath sounds. Respiratory effort normal. Cardiovascular system: S1 & S2 heard, Irregular rhythm rate 90s. No JVD, murmurs, rubs, gallops or clicks. +1 pedal edema. Gastrointestinal system: Abdomen is nondistended, soft and nontender. No organomegaly or masses felt. Normal bowel sounds heard. Central nervous system: Alert and oriented. No focal neurological deficits. Extremities: Symmetric 5 x 5 power. Skin: +right foot is colder than compared to left, +cyanosis of right great toe  Psychiatry: Judgement and insight appear normal. Mood & affect appropriate.   Data Reviewed: I have personally reviewed following labs and imaging studies  CBC:  Recent Labs Lab 02/08/17 1741 02/09/17 0202 02/10/17 0122 02/11/17 0454  WBC 12.1* 10.1 9.3 9.4  NEUTROABS  --   --  7.1 7.1  HGB 17.5* 17.7* 16.8* 16.8*  HCT 52.8* 53.9* 50.9* 51.7*  MCV 81.4 82.8 82.2 81.0  PLT 120* 101* 115* 122*   Basic Metabolic Panel:  Recent Labs Lab 02/08/17 1741 02/08/17 2043 02/09/17 0202 02/10/17 0122 02/11/17 0454 02/12/17 0442  NA 145  --  146* 144 142 142  K 3.8  --  4.0 3.0* 4.5 3.1*  CL 107  --  108 105 104 100*  CO2 27  --  32  GLUCOSE 110*  --  103* 119* 99 103*  BUN 62*  --  61* 48* 43* 40*  CREATININE 1.23*  --  1.07* 0.92 1.00 0.99  CALCIUM 9.8  --  9.7 9.1 9.0 9.0  MG  --  1.8  --  1.4* 1.7 1.6*  PHOS  --   --    --   --   --  2.5   GFR: Estimated Creatinine Clearance: 30.4 mL/min (by C-G formula based on SCr of 0.99 mg/dL). Liver Function Tests:  Recent Labs Lab 02/08/17 2043  AST 45*  ALT 28  ALKPHOS 80  BILITOT 1.2  PROT 5.7*  ALBUMIN 3.7   No results for input(s): LIPASE, AMYLASE in  the last 168 hours. No results for input(s): AMMONIA in the last 168 hours. Coagulation Profile:  Recent Labs Lab 02/08/17 2248  INR 1.29   Cardiac Enzymes:  Recent Labs Lab 02/09/17 0202 02/09/17 0822 02/09/17 1348  TROPONINI 0.32* 0.25* 0.26*   BNP (last 3 results) No results for input(s): PROBNP in the last 8760 hours. HbA1C: No results for input(s): HGBA1C in the last 72 hours. CBG:  Recent Labs Lab 02/08/17 2104  GLUCAP 103*   Lipid Profile: No results for input(s): CHOL, HDL, LDLCALC, TRIG, CHOLHDL, LDLDIRECT in the last 72 hours. Thyroid Function Tests:  Recent Labs  02/11/17 0502  TSH 2.076   Anemia Panel: No results for input(s): VITAMINB12, FOLATE, FERRITIN, TIBC, IRON, RETICCTPCT in the last 72 hours. Sepsis Labs: No results for input(s): PROCALCITON, LATICACIDVEN in the last 168 hours.  Recent Results (from the past 240 hour(s))  Culture, blood (routine x 2)     Status: None (Preliminary result)   Collection Time: 02/08/17 10:34 PM  Result Value Ref Range Status   Specimen Description BLOOD RIGHT WRIST  Final   Special Requests   Final    BOTTLES DRAWN AEROBIC ONLY Blood Culture adequate volume   Culture   Final    NO GROWTH 3 DAYS Performed at [facility], [address], [place], [place] [address]    Report Status PENDING  Incomplete  MRSA PCR Screening     Status: None   Collection Time: 02/09/17 12:09 AM  Result Value Ref Range Status   MRSA by PCR NEGATIVE NEGATIVE Final    Comment:        The GeneXpert MRSA Assay (FDA approved for NASAL specimens only), is one component of a comprehensive MRSA colonization surveillance program. It is  not intended to diagnose MRSA infection nor to guide or monitor treatment for MRSA infections.   Culture, blood (routine x 2)     Status: None (Preliminary result)   Collection Time: 02/09/17 12:44 AM  Result Value Ref Range Status   Specimen Description BLOOD LEFT HAND  Final   Special Requests IN PEDIATRIC BOTTLE Blood Culture adequate volume  Final   Culture   Final    NO GROWTH 3 DAYS Performed at [facility], [address], [place], [place] [address]    Report Status PENDING  Incomplete       Radiology Studies: No results found.    Scheduled Meds: . apixaban  2.5 mg Oral BID  . atorvastatin  40 mg Oral q1800  . cefUROXime  500 mg Oral BID WC  . feeding supplement  1 Container Oral BID BM  . feeding supplement (ENSURE ENLIVE)  237 mL Oral Q24H  . [START ON 02/13/2017] furosemide  40 mg Intravenous Daily  . [START ON 02/13/2017] losartan  50 mg Oral Daily  . metoprolol tartrate  25 mg Oral BID  . multivitamin with minerals  1 tablet Oral Daily  . potassium chloride  40 mEq Oral Q4H   Continuous Infusions:    LOS: 4 days    Time spent: 30 minutes   Noralee Stain, DO [facility] [email or web address] Password [email or web address] 02/12/2017, 11:40 AM

## 2017-02-12 NOTE — Progress Notes (Signed)
Subjective:  Denies any chest pain or shortness of breath more alert and awake today. K being replaced occasional few beats of nonsustained VT asymptomatic on the monitor   Objective:  Vital Signs in the last 24 hours: Temp:  [97.6 F (36.4 C)-98.3 F (36.8 C)] 97.6 F (36.4 C) (09/16 0501) Pulse Rate:  [88-99] 99 (09/16 0501) Resp:  [18-26] 24 (09/16 0501) BP: (140-179)/(72-134) 149/86 (09/16 0501) SpO2:  [95 %-96 %] 96 % (09/16 0501) Weight:  [50.4 kg (111 lb 1.8 oz)] 50.4 kg (111 lb 1.8 oz) (09/16 0501)  Intake/Output from previous day: 09/15 0701 - 09/16 0700 In: 960 [P.O.:960] Out: 1500 [Urine:1500] Intake/Output from this shift: No intake/output data recorded.  Physical Exam: Neck: no adenopathy, no carotid bruit, no JVD and supple, symmetrical, trachea midline Lungs: Decrease breath sound at bases with occasional rhonchi right lung Heart: irregularly irregular rhythm, S1, S2 normal and 2/6 systolic and diastolic murmur noted Abdomen: soft, non-tender; bowel sounds normal; no masses,  no organomegaly Extremities: extremities normal, atraumatic, no cyanosis or edema  Lab Results:  Recent Labs  02/10/17 0122 02/11/17 0454  WBC 9.3 9.4  HGB 16.8* 16.8*  PLT 115* 122*    Recent Labs  02/11/17 0454 02/12/17 0442  NA 142 142  K 4.5 3.1*  CL 104 100*  CO2 30 32  GLUCOSE 99 103*  BUN 43* 40*  CREATININE 1.00 0.99    Recent Labs  02/09/17 1348  TROPONINI 0.26*   Hepatic Function Panel No results for input(s): PROT, ALBUMIN, AST, ALT, ALKPHOS, BILITOT, BILIDIR, IBILI in the last 72 hours. No results for input(s): CHOL in the last 72 hours. No results for input(s): PROTIME in the last 72 hours.  Imaging: Imaging results have been reviewed and No results found.  Cardiac Studies:  Assessment/Plan:  Resolving Acute left heart systolic failure Status post Nonsustained VT asymptomatic Atrial fibrillation, CHA2DS2VASc score of 5 Abnormal troponin-I from  demand ischemia Ischemic cardiomyopathy Mild to moderate AI Left atrial thrombus/Tumor Right lung pneumonia Hypertension CKD, II Hypokalemia Plan Replace K Reduce Lasix to once a day Increase losartan to 50 mg daily  Check labs in a.m.  LOS: 4 days    Rinaldo Cloud 02/12/2017, 9:39 AM

## 2017-02-12 NOTE — Progress Notes (Signed)
PHARMACY NOTE -  Eliquis  Pharmacy has been assisting with dosing of Eliquis for NV AFib. Dosage remains stable at 2.5 mg bid and need for further dosage adjustment appears unlikely at present.    Will sign off at this time.  Please reconsult if a change in clinical status warrants re-evaluation of dosage.  Bernadene Person, PharmD, BCPS Pager: [phone] 02/12/2017, 12:09 PM

## 2017-02-13 ENCOUNTER — Inpatient Hospital Stay (HOSPITAL_COMMUNITY): Payer: Medicare Other

## 2017-02-13 DIAGNOSIS — I739 Peripheral vascular disease, unspecified: Secondary | ICD-10-CM

## 2017-02-13 LAB — BASIC METABOLIC PANEL
ANION GAP: 9 (ref 5–15)
BUN: 41 mg/dL — ABNORMAL HIGH (ref 6–20)
CHLORIDE: 99 mmol/L — AB (ref 101–111)
CO2: 30 mmol/L (ref 22–32)
Calcium: 8.7 mg/dL — ABNORMAL LOW (ref 8.9–10.3)
Creatinine, Ser: 0.79 mg/dL (ref 0.44–1.00)
Glucose, Bld: 92 mg/dL (ref 65–99)
POTASSIUM: 4.2 mmol/L (ref 3.5–5.1)
SODIUM: 138 mmol/L (ref 135–145)

## 2017-02-13 LAB — PHOSPHORUS: PHOSPHORUS: 2.4 mg/dL — AB (ref 2.5–4.6)

## 2017-02-13 LAB — MAGNESIUM: MAGNESIUM: 1.7 mg/dL (ref 1.7–2.4)

## 2017-02-13 NOTE — Progress Notes (Signed)
Physical Therapy Treatment Patient Details Name: Rachel [name] MRN: [id number] DOB: 23-Jan-1931 Today's Date: 02/13/2017    History of Present Illness 83 yo female admitted with A fib, Pna, CHF. Hx of L hip hemi-posterior 2017, OA, arrythmia, HTN    PT Comments    Pt found in recliner with loose stools streaming down chair onto floor.  Assisted out of recliner to [facility].  Assisted with hygiene.  Assisted with amb a limited distance from [facility] back to bed per pt request to rest.  Pt required much physical assist and demonstrates poor standing balance/tolerance.    Follow Up Recommendations  SNF (pt declines SNF/will need 24/7 care at home)     Equipment Recommendations  None recommended by PT    Recommendations for Other Services       Precautions / Restrictions Precautions Precautions: Fall Restrictions Weight Bearing Restrictions: No    Mobility  Bed Mobility Overal bed mobility: Needs Assistance Bed Mobility: Sit to Supine       Sit to supine: HOB elevated;Total assist   General bed mobility comments: assisted back to bed Total Assist pt 15%  increased time to position to comfort.   Transfers Overall transfer level: Needs assistance Equipment used: Rolling walker (2 wheeled);None Transfers: Sit to/from [employer] to Stand: Min assist;Mod assist Stand pivot transfers: Min assist;Mod assist       General transfer comment: assisted from recliner to [facility] with 75% VC's on proper hand placement and turn completion.  Very unsteady with poor kyphotic posture.    Ambulation/Gait Ambulation/Gait assistance: Min assist;Mod assist Ambulation Distance (Feet): 4 Feet Assistive device: Rolling walker (2 wheeled) Gait Pattern/deviations: Step-to pattern;Step-through pattern;Decreased step length - right;Decreased step length - left;Trunk flexed;Narrow base of support Gait velocity: decreased   General Gait Details: very unsteady gait with short shuffled  steps.  Poor Kyphotic posture.  Limited distance due to weakness.  Amb from [facility] to bed with 75% VC's on proper walker advancement and safety with turns.     Stairs            Wheelchair Mobility    Modified Rankin (Stroke Patients Only)       Balance                                            Cognition Arousal/Alertness: Awake/alert Behavior During Therapy: WFL for tasks assessed/performed Overall Cognitive Status: Within Functional Limits for tasks assessed                                        Exercises      General Comments        Pertinent Vitals/Pain Faces Pain Scale: Hurts a little bit Pain Location: back, bottom, heels Pain Descriptors / Indicators: Sore;Aching Pain Intervention(s): Monitored during session    Home Living                      Prior Function            PT Goals (current goals can now be found in the care plan section) Progress towards PT goals: Progressing toward goals    Frequency    Min 3X/week      PT Plan Current plan remains appropriate    Co-evaluation  AM-PAC PT "6 Clicks" Daily Activity  Outcome Measure  Difficulty turning over in bed (including adjusting bedclothes, sheets and blankets)?: Unable Difficulty moving from lying on back to sitting on the side of the bed? : Unable Difficulty sitting down on and standing up from a chair with arms (e.g., wheelchair, bedside commode, etc,.)?: Unable Help needed moving to and from a bed to chair (including a wheelchair)?: Total Help needed walking in hospital room?: Total Help needed climbing 3-5 steps with a railing? : Total 6 Click Score: 6    End of Session Equipment Utilized During Treatment: Gait belt Activity Tolerance: Patient limited by fatigue Patient left: in chair;with call bell/phone within reach;with bed alarm set Nurse Communication: Mobility status PT Visit Diagnosis: Muscle weakness  (generalized) (M62.81);Difficulty in walking, not elsewhere classified (R26.2)     Time: 1405-1430 PT Time Calculation (min) (ACUTE ONLY): 25 min  Charges:  $Gait Training: 8-22 mins $Therapeutic Activity: 8-22 mins                    G Codes:       Felecia Shelling  PTA [facility]  Acute  Rehab Pager      [phone]

## 2017-02-13 NOTE — Progress Notes (Signed)
Ref: Rachel Dills, MD   Subjective:  Feeling better. Pulse rate improved. General condition remains poor. Now has left sided severe peripheral vascular disease. Refuses SNF. Will need 24/7 care at home.  Objective:  Vital Signs in the last 24 hours: Temp:  [98 F (36.7 C)-98.5 F (36.9 C)] 98.5 F (36.9 C) (09/17 1458) Pulse Rate:  [78-110] 80 (09/17 1458) Cardiac Rhythm: Atrial fibrillation (09/17 1922) Resp:  [20-22] 20 (09/17 1458) BP: (110-136)/(66-82) 130/72 (09/17 1458) SpO2:  [95 %-98 %] 95 % (09/17 1458) Weight:  [49.9 kg (110 lb 0.2 oz)] 49.9 kg (110 lb 0.2 oz) (09/17 0516)  Physical Exam: BP Readings from Last 1 Encounters:  02/13/17 130/72    Wt Readings from Last 1 Encounters:  02/13/17 49.9 kg (110 lb 0.2 oz)    Weight change: -0.5 kg (-1 lb 1.6 oz) Body mass index is 21.48 kg/m. HEENT: [place]/AT, Eyes-Blue, PERL, EOMI, Conjunctiva-Pink, Sclera-Non-icteric Neck: No JVD, No bruit, Trachea midline. Lungs:  Clear, Bilateral. Cardiac:  Irregular rhythm, normal S1 and S2, no S3. II/VI systolic murmur. Abdomen:  Soft, non-tender. BS present. Extremities:  No edema present. No cyanosis. No clubbing. CNS: AxOx3, Cranial nerves grossly intact, moves all 4 extremities.  Skin: Warm and dry.   Intake/Output from previous day: 09/16 0701 - 09/17 0700 In: 310 [P.O.:310] Out: 100 [Urine:100]    Lab Results: BMET    Component Value Date/Time   NA 138 02/13/2017 0500   NA 142 02/12/2017 0442   NA 142 02/11/2017 0454   K 4.2 02/13/2017 0500   K 3.1 (L) 02/12/2017 0442   K 4.5 02/11/2017 0454   CL 99 (L) 02/13/2017 0500   CL 100 (L) 02/12/2017 0442   CL 104 02/11/2017 0454   CO2 30 02/13/2017 0500   CO2 32 02/12/2017 0442   CO2 30 02/11/2017 0454   GLUCOSE 92 02/13/2017 0500   GLUCOSE 103 (H) 02/12/2017 0442   GLUCOSE 99 02/11/2017 0454   BUN 41 (H) 02/13/2017 0500   BUN 40 (H) 02/12/2017 0442   BUN 43 (H) 02/11/2017 0454   CREATININE 0.79 02/13/2017 0500   CREATININE 0.99 02/12/2017 0442   CREATININE 1.00 02/11/2017 0454   CALCIUM 8.7 (L) 02/13/2017 0500   CALCIUM 9.0 02/12/2017 0442   CALCIUM 9.0 02/11/2017 0454   GFRNONAA >60 02/13/2017 0500   GFRNONAA 51 (L) 02/12/2017 0442   GFRNONAA 50 (L) 02/11/2017 0454   GFRAA >60 02/13/2017 0500   GFRAA 59 (L) 02/12/2017 0442   GFRAA 58 (L) 02/11/2017 0454   CBC    Component Value Date/Time   WBC 9.4 02/11/2017 0454   RBC 6.38 (H) 02/11/2017 0454   HGB 16.8 (H) 02/11/2017 0454   HCT 51.7 (H) 02/11/2017 0454   PLT 122 (L) 02/11/2017 0454   MCV 81.0 02/11/2017 0454   MCH 26.3 02/11/2017 0454   MCHC 32.5 02/11/2017 0454   RDW 16.7 (H) 02/11/2017 0454   LYMPHSABS 1.3 02/11/2017 0454   MONOABS 0.9 02/11/2017 0454   EOSABS 0.1 02/11/2017 0454   BASOSABS 0.0 02/11/2017 0454   HEPATIC Function Panel  Recent Labs  02/08/17 2043  PROT 5.7*   HEMOGLOBIN A1C No components found for: HGA1C,  MPG CARDIAC ENZYMES Lab Results  Component Value Date   CKTOTAL 673 (H) 02/12/2016   TROPONINI 0.26 (HH) 02/09/2017   TROPONINI 0.25 (HH) 02/09/2017   TROPONINI 0.32 (HH) 02/09/2017   BNP No results for input(s): PROBNP in the last 8760 hours. TSH  Recent  Labs  02/11/17 0502  TSH 2.076   CHOLESTEROL No results for input(s): CHOL in the last 8760 hours.  Scheduled Meds: . apixaban  2.5 mg Oral BID  . atorvastatin  40 mg Oral q1800  . feeding supplement  1 Container Oral BID BM  . feeding supplement (ENSURE ENLIVE)  237 mL Oral Q24H  . furosemide  40 mg Intravenous Daily  . losartan  50 mg Oral Daily  . metoprolol tartrate  25 mg Oral BID  . multivitamin with minerals  1 tablet Oral Daily   Continuous Infusions: PRN Meds:.acetaminophen, Influenza vac split quadrivalent PF, nitroGLYCERIN, ondansetron (ZOFRAN) IV  Assessment/Plan: Acute left heart systolic failure Atrial fibrillation, rate controlled Abnormal troponin-I from demand ischemia Ischemic cardiomyopathy Mild to moderate  AI Left atrial thrombus/Tumor Right lung pneumonia Hypertension Left lower extremity severe peripheral vascular disease  Continue medical treatment.   LOS: 5 days    [name]  MD  02/13/2017, 8:11 PM

## 2017-02-13 NOTE — Progress Notes (Signed)
PROGRESS NOTE    Rachel [name]  NFA:[id number] DOB: 31-Oct-1930 DOA: 02/08/2017 PCP: Renford Dills, MD     Brief Narrative:  Rachel [name] is a 83 y.o. female with medical history significant for hypertension, osteoarthritis, and history of cardiac arrhythmia, now presenting to the emergency department for evaluation of generalized weakness and malaise. Patient was accompanied by her caretaker, with whom she lives, reporting that the patient has been experiencing increasing generalized weakness over the past couple days, much worse today. Patient was seated on the toilet, but was too weak to get up, prompting the caretaker to call EMS for evaluation. Patient required significant assistance just to stand and was brought into the ED for evaluation. In the ED, she was found to be in A Fib with tachycardia, started on cardizem gtt. She was also found to be fluid overloaded with BNP 3637 and was given IV lasix. She underwent echocardiogram which showed reduced systolic function EF 25% along with severe hypokinesis of anteroseptal and anterior myocardium. Also found a small thrombus in the left atrium. Cardiology was consulted.   Assessment & Plan:   Principal Problem:   New onset atrial fibrillation (HCC) Active Problems:   HTN (hypertension)   CAP (community acquired pneumonia)   Renal insufficiency   NSTEMI (non-ST elevated myocardial infarction) (HCC)   Acute CHF (congestive heart failure) (HCC)   General weakness   Malnutrition of moderate degree   New onset A fib with RVR  -CHADS-VASc is at least 4 -Started eliquis -Rate well controlled on lopressor  Acute systolic and diastolic CHF -BNP 3637 on admission -Cardiology consulted  -Lasix IV  daily, started cozaar and lopressor -Strict I/O, daily weight  Elevated troponin -Could be due to demand ischemia in setting of infection, A Fib, CHF -0.25 --> 0.32 --> 0.25 -Not a candidate for heart cath   Right upper lobar  pneumonia -Blood cultures negative to date  -Deescalate antibiotics to ceftin   AKI on CKD stage 3  -Baseline Cr 0.6  -Trend BMP, stable currently   Concern for refeeding syndrome -Continue to monitor K, Mg, Phos   PVD -Check ABI due to worsening cyanosis right great toe. Right toe improved in appearance on today's exam.     DVT prophylaxis: Eliquis  Code Status: DNR Family Communication: No family at bedside  Disposition Plan: Patient refusing SNF, states she will work on arranging for 24 hour care at home    Consultants:   Cardiology  Procedures:   None  Antimicrobials:  Anti-infectives    Start     Dose/Rate Route Frequency Ordered Stop   02/11/17 1700  cefUROXime (CEFTIN) tablet 500 mg     500 mg Oral 2 times daily with meals 02/11/17 1139 02/13/17 2359   02/10/17 2200  azithromycin (ZITHROMAX) tablet 500 mg  Status:  Discontinued     500 mg Oral Daily at bedtime 02/10/17 0826 02/11/17 1139   02/09/17 2200  cefTRIAXone (ROCEPHIN) 1 g in dextrose 5 % 50 mL IVPB  Status:  Discontinued     1 g 100 mL/hr over 30 Minutes Intravenous Daily at bedtime 02/08/17 2243 02/11/17 1139   02/09/17 2200  azithromycin (ZITHROMAX) 500 mg in dextrose 5 % 250 mL IVPB  Status:  Discontinued     500 mg 250 mL/hr over 60 Minutes Intravenous Every 24 hours 02/08/17 2243 02/10/17 0826   02/08/17 2230  cefTRIAXone (ROCEPHIN) 1 g in dextrose 5 % 50 mL IVPB     1 g  100 mL/hr over 30 Minutes Intravenous  Once 02/08/17 2227 02/09/17 0123   02/08/17 2230  azithromycin (ZITHROMAX) 500 mg in dextrose 5 % 250 mL IVPB     500 mg 250 mL/hr over 60 Minutes Intravenous  Once 02/08/17 2227 02/09/17 0307       Subjective: No complaints this morning. Currently has no complaints of chest pain, heart palpitations or shortness of breath.   Objective: Vitals:   02/12/17 1300 02/12/17 2050 02/13/17 0516 02/13/17 1039  BP: (!) 113/59 110/82 136/81 130/66  Pulse: 84 (!) 110 98 78  Resp:  (!) 22 20    Temp: 97.7 F (36.5 C) 98.5 F (36.9 C) 98 F (36.7 C)   TempSrc: Axillary Oral Axillary   SpO2: 97% 98% 96%   Weight:   49.9 kg (110 lb 0.2 oz)   Height:        Intake/Output Summary (Last 24 hours) at 02/13/17 1302 Last data filed at 02/13/17 0834  Gross per 24 hour  Intake              910 ml  Output              100 ml  Net              810 ml   Filed Weights   02/11/17 0455 02/12/17 0501 02/13/17 0516  Weight: 50.4 kg (111 lb 1.8 oz) 50.4 kg (111 lb 1.8 oz) 49.9 kg (110 lb 0.2 oz)    Examination:  General exam: Appears calm and comfortable  Respiratory system: Diminished breath sounds. Respiratory effort normal. Cardiovascular system: S1 & S2 heard, Irregular rhythm rate 90s. No JVD, murmurs, rubs, gallops or clicks. +1-2 bilateral lower extremity edema as well as dependent regions  Gastrointestinal system: Abdomen is nondistended, soft and nontender. No organomegaly or masses felt. Normal bowel sounds heard. Central nervous system: Alert and oriented. No focal neurological deficits. Extremities: Symmetric 5 x 5 power. Skin: +erythema of bilateral great toe  Psychiatry: Judgement and insight appear normal. Mood & affect appropriate.   Data Reviewed: I have personally reviewed following labs and imaging studies  CBC:  Recent Labs Lab 02/08/17 1741 02/09/17 0202 02/10/17 0122 02/11/17 0454  WBC 12.1* 10.1 9.3 9.4  NEUTROABS  --   --  7.1 7.1  HGB 17.5* 17.7* 16.8* 16.8*  HCT 52.8* 53.9* 50.9* 51.7*  MCV 81.4 82.8 82.2 81.0  PLT 120* 101* 115* 122*   Basic Metabolic Panel:  Recent Labs Lab 02/08/17 2043 02/09/17 0202 02/10/17 0122 02/11/17 0454 02/12/17 0442 02/13/17 0500  NA  --  146* 144 142 142 138  K  --  4.0 3.0* 4.5 3.1* 4.2  CL  --  108 105 104 100* 99*  CO2  --  27 27 30  32 30  GLUCOSE  --  103* 119* 99 103* 92  BUN  --  61* 48* 43* 40* 41*  CREATININE  --  1.07* 0.92 1.00 0.99 0.79  CALCIUM  --  9.7 9.1 9.0 9.0 8.7*  MG 1.8  --  1.4* 1.7  1.6* 1.7  PHOS  --   --   --   --  2.5 2.4*   GFR: Estimated Creatinine Clearance: 37.6 mL/min (by C-G formula based on SCr of 0.79 mg/dL). Liver Function Tests:  Recent Labs Lab 02/08/17 2043  AST 45*  ALT 28  ALKPHOS 80  BILITOT 1.2  PROT 5.7*  ALBUMIN 3.7   No results for input(s): LIPASE, AMYLASE  in the last 168 hours. No results for input(s): AMMONIA in the last 168 hours. Coagulation Profile:  Recent Labs Lab 02/08/17 2248  INR 1.29   Cardiac Enzymes:  Recent Labs Lab 02/09/17 0202 02/09/17 0822 02/09/17 1348  TROPONINI 0.32* 0.25* 0.26*   BNP (last 3 results) No results for input(s): PROBNP in the last 8760 hours. HbA1C: No results for input(s): HGBA1C in the last 72 hours. CBG:  Recent Labs Lab 02/08/17 2104  GLUCAP 103*   Lipid Profile: No results for input(s): CHOL, HDL, LDLCALC, TRIG, CHOLHDL, LDLDIRECT in the last 72 hours. Thyroid Function Tests:  Recent Labs  02/11/17 0502  TSH 2.076   Anemia Panel: No results for input(s): VITAMINB12, FOLATE, FERRITIN, TIBC, IRON, RETICCTPCT in the last 72 hours. Sepsis Labs: No results for input(s): PROCALCITON, LATICACIDVEN in the last 168 hours.  Recent Results (from the past 240 hour(s))  Culture, blood (routine x 2)     Status: None (Preliminary result)   Collection Time: 02/08/17 10:34 PM  Result Value Ref Range Status   Specimen Description BLOOD RIGHT WRIST  Final   Special Requests   Final    BOTTLES DRAWN AEROBIC ONLY Blood Culture adequate volume   Culture   Final    NO GROWTH 3 DAYS Performed at [facility], [address], [place], [place] [address]    Report Status PENDING  Incomplete  MRSA PCR Screening     Status: None   Collection Time: 02/09/17 12:09 AM  Result Value Ref Range Status   MRSA by PCR NEGATIVE NEGATIVE Final    Comment:        The GeneXpert MRSA Assay (FDA approved for NASAL specimens only), is one component of a comprehensive MRSA  colonization surveillance program. It is not intended to diagnose MRSA infection nor to guide or monitor treatment for MRSA infections.   Culture, blood (routine x 2)     Status: None (Preliminary result)   Collection Time: 02/09/17 12:44 AM  Result Value Ref Range Status   Specimen Description BLOOD LEFT HAND  Final   Special Requests IN PEDIATRIC BOTTLE Blood Culture adequate volume  Final   Culture   Final    NO GROWTH 3 DAYS Performed at [facility], [address] [place], [place] [address]    Report Status PENDING  Incomplete       Radiology Studies: No results found.    Scheduled Meds: . apixaban  2.5 mg Oral BID  . atorvastatin  40 mg Oral q1800  . cefUROXime  500 mg Oral BID WC  . feeding supplement  1 Container Oral BID BM  . feeding supplement (ENSURE ENLIVE)  237 mL Oral Q24H  . furosemide  40 mg Intravenous Daily  . losartan  50 mg Oral Daily  . metoprolol tartrate  25 mg Oral BID  . multivitamin with minerals  1 tablet Oral Daily   Continuous Infusions:    LOS: 5 days    Time spent: 30 minutes   Noralee Stain, DO [facility] [email or web address] Password [email or web address] 02/13/2017, 1:02 PM

## 2017-02-13 NOTE — Progress Notes (Signed)
VASCULAR LAB PRELIMINARY  ARTERIAL  ABI completed:    RIGHT    LEFT    PRESSURE WAVEFORM  PRESSURE WAVEFORM  BRACHIAL 132 Triphasic BRACHIAL IV Triphasic  DP 124 Triphasic        AT 41 Dampened monophasic  PT 139 Triphasic PT 40 Dampened monophasic    RIGHT LEFT  ABI 1.05 0.30   ABIs and Doppler waveforms indicate normal arterial flow at rest othe right. Left ABIs and Doppler waveforms indicate a severe reduction in arterial flow at rest.  Jazleen Robeck, RVS 02/13/2017, 4:20 PM

## 2017-02-13 NOTE — Clinical Social Work Note (Signed)
Clinical Social Work Assessment  Patient Details  Name: Rachel [name] MRN: [id number] Date of Birth: 1930/11/26  Date of referral:  02/13/17               Reason for consult:  Discharge Planning, Facility Placement                Permission sought to share information with:    Permission granted to share information::     Name::        Agency::     Relationship::     Contact Information:     Housing/Transportation Living arrangements for the past 2 months:  Single Family Home Source of Information:  Patient, Medical Team Patient Interpreter Needed:  None Criminal Activity/Legal Involvement Pertinent to Current Situation/Hospitalization:  No - Comment as needed Significant Relationships:  Friend, [employer] Lives with:  Self Do you feel safe going back to the place where you live?  Yes Need for family participation in patient care:  No (Coment)  Care giving concerns:  Pt from home where she resides alone. States she is independent at baseline for most part but does have private duty aid. Denies any caregiving concerns.    Social Worker assessment / plan:  CSW consulted due to recommendation for ST rehab per therapy eval 02/10/17. Met with pt at bedside- she was alert and oriented, somewhat hard of hearing, pleasant and engaged. States she went to rehab fall 2017 at [facility] afer a fractured hip, thereafter lived in the assisted living building x1 month then moved back home when she found she felt she did not require this level of care and could not justify the cost. Has been at home ever since, states she feels she can continue managing at home on DC. Reports she uses [facility] and has arranged for 24/7 supervision at [place] rather than the 3hrs/day she had employed prior to admission.  CSW and pt discussed option for SNF referral at length and pt remains with plan to return home rather than pursue rehab at [facility].  Plan: Pt plans to return home at DC with private duty aid  providing 24/7 supervision. CSW will sign off unless further needs arise.   Employment status:  Retired Forensic scientist:  Medicare PT Recommendations:  [place], [place] / Referral to community resources:     Patient/Family's Response to care:  Pt engaged and appreciative of care.   Patient/Family's Understanding of and Emotional Response to Diagnosis, Current Treatment, and Prognosis:  Pt demonstrates adequate understanding of her care, recommendations, and plan. Asks pertinent questions and showed understanding of the different levels of care and supervision needed in her home due to her being weaker than at baseline. States, "I want to go home. I was in rehab before last year and I feel like with supervision with [facility] I will do just as well at home as at rehab."  Emotional Assessment Appearance:  Appears stated age Attitude/Demeanor/Rapport:   (pleasant, engaged) Affect (typically observed):  Calm, Accepting Orientation:  Oriented to Self, Oriented to Place, Oriented to  Time, Oriented to Situation Alcohol / Substance use:  Not Applicable Psych involvement (Current and /or in the community):  No (Comment)  Discharge Needs  Concerns to be addressed:  No discharge needs identified Readmission within the last 30 days:  No Current discharge risk:  None Barriers to Discharge:  Continued Medical Work up   [employer], LCSW 02/13/2017, 11:47 AM  [phone]

## 2017-02-13 NOTE — Progress Notes (Signed)
Pt have [facility] [phone],  24/7 for [facility] needs.

## 2017-02-13 NOTE — Care Management Important Message (Signed)
Important Message  Patient Details  Name: ROCELYN TURSO MRN: [id number] Date of Birth: Feb 14, 1931   Medicare Important Message Given:  Yes    Caren Macadam 02/13/2017, 9:28 AMImportant Message  Patient Details  Name: LAICEE SITLER MRN: [id number] Date of Birth: 1930-10-24   Medicare Important Message Given:  Yes    Caren Macadam 02/13/2017, 9:27 AM

## 2017-02-14 LAB — BASIC METABOLIC PANEL
Anion gap: 10 (ref 5–15)
BUN: 48 mg/dL — AB (ref 6–20)
CHLORIDE: 100 mmol/L — AB (ref 101–111)
CO2: 29 mmol/L (ref 22–32)
Calcium: 8.6 mg/dL — ABNORMAL LOW (ref 8.9–10.3)
Creatinine, Ser: 0.82 mg/dL (ref 0.44–1.00)
GFR calc Af Amer: >60 mL/min (ref >60)
GFR calc non Af Amer: >60 mL/min (ref >60)
GLUCOSE: 87 mg/dL (ref 65–99)
POTASSIUM: 4 mmol/L (ref 3.5–5.1)
Sodium: 139 mmol/L (ref 135–145)

## 2017-02-14 LAB — CULTURE, BLOOD (ROUTINE X 2)
CULTURE: NO GROWTH
Culture: NO GROWTH
Special Requests: ADEQUATE
Special Requests: ADEQUATE

## 2017-02-14 LAB — MAGNESIUM: Magnesium: 1.7 mg/dL (ref 1.7–2.4)

## 2017-02-14 LAB — PHOSPHORUS: Phosphorus: 2.2 mg/dL — ABNORMAL LOW (ref 2.5–4.6)

## 2017-02-14 MED ORDER — MAGNESIUM SULFATE IN D5W 1-5 GM/100ML-% IV SOLN
1.0000 g | Freq: Once | INTRAVENOUS | Status: AC
  Administered 2017-02-14: 1 g via INTRAVENOUS
  Filled 2017-02-14: qty 100

## 2017-02-14 NOTE — Progress Notes (Signed)
Ref: Renford Dills, MD   Subjective:  Afebrile. Mild hypocalcemia and low normal magnesium level. Patient has plan for 24/7 care at home. Monitor shows atrial fibrillation with controlled ventricular response. VS otherwise stable.  Objective:  Vital Signs in the last 24 hours: Temp:  [97.5 F (36.4 C)-98.9 F (37.2 C)] 97.6 F (36.4 C) (09/18 1456) Pulse Rate:  [80-96] 83 (09/18 1456) Cardiac Rhythm: Atrial fibrillation (09/18 0700) Resp:  [18-20] 18 (09/18 1456) BP: (123-141)/(55-94) 141/62 (09/18 1456) SpO2:  [96 %-100 %] 100 % (09/18 1456) Weight:  [51.2 kg (112 lb 14 oz)] 51.2 kg (112 lb 14 oz) (09/18 0557)  Physical Exam: BP Readings from Last 1 Encounters:  02/14/17 (!) 141/62    Wt Readings from Last 1 Encounters:  02/14/17 51.2 kg (112 lb 14 oz)    Weight change: 1.3 kg (2 lb 13.9 oz) Body mass index is 22.04 kg/m. HEENT: [place]/AT, Eyes-Blue, PERL, EOMI, Conjunctiva-Pink, Sclera-Non-icteric Neck: No JVD, No bruit, Trachea midline. Lungs:  Clear, Bilateral. Cardiac:  Irregular rhythm, normal S1 and S2, no S3. II/VI systolic murmur. Abdomen:  Soft, non-tender. BS present. Extremities:  No edema present. No cyanosis. No clubbing. CNS: AxOx3, Cranial nerves grossly intact, moves all 4 extremities.  Skin: Warm and dry.   Intake/Output from previous day: 09/17 0701 - 09/18 0700 In: 840 [P.O.:840] Out: -     Lab Results: BMET    Component Value Date/Time   NA 139 02/14/2017 0448   NA 138 02/13/2017 0500   NA 142 02/12/2017 0442   K 4.0 02/14/2017 0448   K 4.2 02/13/2017 0500   K 3.1 (L) 02/12/2017 0442   CL 100 (L) 02/14/2017 0448   CL 99 (L) 02/13/2017 0500   CL 100 (L) 02/12/2017 0442   CO2 29 02/14/2017 0448   CO2 30 02/13/2017 0500   CO2 32 02/12/2017 0442   GLUCOSE 87 02/14/2017 0448   GLUCOSE 92 02/13/2017 0500   GLUCOSE 103 (H) 02/12/2017 0442   BUN 48 (H) 02/14/2017 0448   BUN 41 (H) 02/13/2017 0500   BUN 40 (H) 02/12/2017 0442   CREATININE 0.82  02/14/2017 0448   CREATININE 0.79 02/13/2017 0500   CREATININE 0.99 02/12/2017 0442   CALCIUM 8.6 (L) 02/14/2017 0448   CALCIUM 8.7 (L) 02/13/2017 0500   CALCIUM 9.0 02/12/2017 0442   GFRNONAA >60 02/14/2017 0448   GFRNONAA >60 02/13/2017 0500   GFRNONAA 51 (L) 02/12/2017 0442   GFRAA >60 02/14/2017 0448   GFRAA >60 02/13/2017 0500   GFRAA 59 (L) 02/12/2017 0442   CBC    Component Value Date/Time   WBC 9.4 02/11/2017 0454   RBC 6.38 (H) 02/11/2017 0454   HGB 16.8 (H) 02/11/2017 0454   HCT 51.7 (H) 02/11/2017 0454   PLT 122 (L) 02/11/2017 0454   MCV 81.0 02/11/2017 0454   MCH 26.3 02/11/2017 0454   MCHC 32.5 02/11/2017 0454   RDW 16.7 (H) 02/11/2017 0454   LYMPHSABS 1.3 02/11/2017 0454   MONOABS 0.9 02/11/2017 0454   EOSABS 0.1 02/11/2017 0454   BASOSABS 0.0 02/11/2017 0454   HEPATIC Function Panel  Recent Labs  02/08/17 2043  PROT 5.7*   HEMOGLOBIN A1C No components found for: HGA1C,  MPG CARDIAC ENZYMES Lab Results  Component Value Date   CKTOTAL 673 (H) 02/12/2016   TROPONINI 0.26 (HH) 02/09/2017   TROPONINI 0.25 (HH) 02/09/2017   TROPONINI 0.32 (HH) 02/09/2017   BNP No results for input(s): PROBNP in the last 8760  hours. TSH  Recent Labs  02/11/17 0502  TSH 2.076   CHOLESTEROL No results for input(s): CHOL in the last 8760 hours.  Scheduled Meds: . apixaban  2.5 mg Oral BID  . atorvastatin  40 mg Oral q1800  . feeding supplement  1 Container Oral BID BM  . feeding supplement (ENSURE ENLIVE)  237 mL Oral Q24H  . furosemide  40 mg Intravenous Daily  . losartan  50 mg Oral Daily  . metoprolol tartrate  25 mg Oral BID  . multivitamin with minerals  1 tablet Oral Daily   Continuous Infusions: PRN Meds:.acetaminophen, Influenza vac split quadrivalent PF, nitroGLYCERIN, ondansetron (ZOFRAN) IV  Assessment/Plan: Acute left heart systolic failure-compensated Atrial fibrillation, persistent Abnormal I from demand ischemia Ischemic  cardiomyopathy Mild to moderate AI Left atrial thrombus/Tumor Right lung pneumonia Hypertension Left lower extremity severe peripheral Vascular disease.  Continue medical treatment. Re-consult as needed. F/U in 1 week post discharge.   LOS: 6 days    [name]  MD  02/14/2017, 4:57 PM

## 2017-02-14 NOTE — Progress Notes (Signed)
PROGRESS NOTE    Rachel [name]  FIE:[id number] DOB: March 02, 1931 DOA: 02/08/2017 PCP: Renford Dills, MD     Brief Narrative:  Rachel [name] is a 83 y.o. female with medical history significant for hypertension, osteoarthritis, and history of cardiac arrhythmia, now presenting to the emergency department for evaluation of generalized weakness and malaise. Patient was accompanied by her caretaker reporting that the patient has been experiencing increasing generalized weakness over the past couple days, much worse today. Patient was seated on the toilet, but was too weak to get up, prompting the caretaker to call EMS for evaluation. Patient required significant assistance just to stand and was brought into the ED for evaluation. In the ED, she was found to be in A Fib with tachycardia, started on cardizem gtt. She was also found to be fluid overloaded with BNP 3637 and was given IV lasix. She underwent echocardiogram which showed reduced systolic function EF 25% along with severe hypokinesis of anteroseptal and anterior myocardium. Also found a small thrombus in the left atrium. Cardiology was consulted.   Assessment & Plan:   Principal Problem:   New onset atrial fibrillation (HCC) Active Problems:   HTN (hypertension)   CAP (community acquired pneumonia)   Renal insufficiency   NSTEMI (non-ST elevated myocardial infarction) (HCC)   Acute CHF (congestive heart failure) (HCC)   General weakness   Malnutrition of moderate degree   New onset A fib with RVR  -CHADS-VASc is at least 4 -Started eliquis -Rate well controlled on lopressor  Acute systolic and diastolic CHF -BNP 3637 on admission -Cardiology consulted  -Lasix IV 40mg  daily, started cozaar and lopressor -Strict I/O, daily weight -Continues to be very edematous. Continue lasix IV   Elevated troponin -Could be due to demand ischemia in setting of infection, A Fib, CHF -0.25 --> 0.32 --> 0.25 -Not a candidate for heart cath     Right upper lobar pneumonia -Blood cultures negative to date  -Deescalate antibiotics to ceftin. Completed therapy.    AKI on CKD stage 3  -Baseline Cr 0.6  -Trend BMP, stable currently   Concern for refeeding syndrome -Continue to monitor K, Mg, Phos   PVD -Left ABIs and Doppler waveforms indicate a severe reduction in arterial flow at rest. Not sure that patient is a good candidate for any further procedure. Continue eliquis.     DVT prophylaxis: Eliquis  Code Status: DNR Family Communication: No family at bedside  Disposition Plan: Patient refusing SNF, states she will work on arranging for 24 hour care at home    Consultants:   Cardiology  Procedures:   None  Antimicrobials:  Anti-infectives    Start     Dose/Rate Route Frequency Ordered Stop   02/11/17 1700  cefUROXime (CEFTIN) tablet 500 mg     500 mg Oral 2 times daily with meals 02/11/17 1139 02/13/17 1916   02/10/17 2200  azithromycin (ZITHROMAX) tablet 500 mg  Status:  Discontinued     500 mg Oral Daily at bedtime 02/10/17 0826 02/11/17 1139   02/09/17 2200  cefTRIAXone (ROCEPHIN) 1 g in dextrose 5 % 50 mL IVPB  Status:  Discontinued     1 g 100 mL/hr over 30 Minutes Intravenous Daily at bedtime 02/08/17 2243 02/11/17 1139   02/09/17 2200  azithromycin (ZITHROMAX) 500 mg in dextrose 5 % 250 mL IVPB  Status:  Discontinued     500 mg 250 mL/hr over 60 Minutes Intravenous Every 24 hours 02/08/17 2243 02/10/17 0826  02/08/17 2230  cefTRIAXone (ROCEPHIN) 1 g in dextrose 5 % 50 mL IVPB     1 g 100 mL/hr over 30 Minutes Intravenous  Once 02/08/17 2227 02/09/17 0123   02/08/17 2230  azithromycin (ZITHROMAX) 500 mg in dextrose 5 % 250 mL IVPB     500 mg 250 mL/hr over 60 Minutes Intravenous  Once 02/08/17 2227 02/09/17 0307       Subjective: No complaints this morning. Currently has no complaints of chest pain, heart palpitations or shortness of breath.   Objective: Vitals:   02/13/17 1039 02/13/17 1458  02/13/17 2206 02/14/17 0557  BP: 130/66 130/72 (!) 123/55 (!) 140/94  Pulse: 78 80 96 80  Resp:  Temp:  98.5 F (36.9 C) 98.9 F (37.2 C) (!) 97.5 F (36.4 C)  TempSrc:  Oral Axillary Oral  SpO2:  95% 96% 97%  Weight:    51.2 kg (112 lb 14 oz)  Height:       No intake or output data in the 24 hours ending 02/14/17 1334 Filed Weights   02/12/17 0501 02/13/17 0516 02/14/17 0557  Weight: 50.4 kg (111 lb 1.8 oz) 49.9 kg (110 lb 0.2 oz) 51.2 kg (112 lb 14 oz)    Examination:  General exam: Appears calm and comfortable  Respiratory system: Diminished breath sounds. Respiratory effort normal. Cardiovascular system: S1 & S2 heard, Irregular rhythm rate 90s. No JVD, murmurs, rubs, gallops or clicks. +1-2 bilateral lower extremity edema as well as dependent regions  Gastrointestinal system: Abdomen is nondistended, soft and nontender. No organomegaly or masses felt. Normal bowel sounds heard. Central nervous system: Alert and oriented. No focal neurological deficits. Extremities: Symmetric in appearance, left foot is colder to touch compared to right  Skin: +erythema of bilateral great toe Psychiatry: Judgement and insight appear normal. Mood & affect appropriate.   Data Reviewed: I have personally reviewed following labs and imaging studies  CBC:  Recent Labs Lab 02/08/17 1741 02/09/17 0202 02/10/17 0122 02/11/17 0454  WBC 12.1* 10.1 9.3 9.4  NEUTROABS  --   --  7.1 7.1  HGB 17.5* 17.7* 16.8* 16.8*  HCT 52.8* 53.9* 50.9* 51.7*  MCV 81.4 82.8 82.2 81.0  PLT 120* 101* 115* 122*   Basic Metabolic Panel:  Recent Labs Lab 02/10/17 0122 02/11/17 0454 02/12/17 0442 02/13/17 0500 02/14/17 0448  NA 144 142 142 138 139  K 3.0* 4.5 3.1* 4.2 4.0  CL 105 104 100* 99* 100*  CO2 27 30 32 30 29  GLUCOSE 119* 99 103* 92 87  BUN 48* 43* 40* 41* 48*  CREATININE 0.92 1.00 0.99 0.79 0.82  CALCIUM 9.1 9.0 9.0 8.7* 8.6*  MG 1.4* 1.7 1.6* 1.7 1.7  PHOS  --   --  2.5 2.4* 2.2*    GFR: Estimated Creatinine Clearance: 36.7 mL/min (by C-G formula based on SCr of 0.82 mg/dL). Liver Function Tests:  Recent Labs Lab 02/08/17 2043  AST 45*  ALT 28  ALKPHOS 80  BILITOT 1.2  PROT 5.7*  ALBUMIN 3.7   No results for input(s): LIPASE, AMYLASE in the last 168 hours. No results for input(s): AMMONIA in the last 168 hours. Coagulation Profile:  Recent Labs Lab 02/08/17 2248  INR 1.29   Cardiac Enzymes:  Recent Labs Lab 02/09/17 0202 02/09/17 0822 02/09/17 1348  TROPONINI 0.32* 0.25* 0.26*   BNP (last 3 results) No results for input(s): PROBNP in the last 8760 hours. HbA1C: No results for input(s): HGBA1C in  the last 72 hours. CBG:  Recent Labs Lab 02/08/17 2104  GLUCAP 103*   Lipid Profile: No results for input(s): CHOL, HDL, LDLCALC, TRIG, CHOLHDL, LDLDIRECT in the last 72 hours. Thyroid Function Tests: No results for input(s): TSH, T4TOTAL, FREET4, T3FREE, THYROIDAB in the last 72 hours. Anemia Panel: No results for input(s): VITAMINB12, FOLATE, FERRITIN, TIBC, IRON, RETICCTPCT in the last 72 hours. Sepsis Labs: No results for input(s): PROCALCITON, LATICACIDVEN in the last 168 hours.  Recent Results (from the past 240 hour(s))  Culture, blood (routine x 2)     Status: None (Preliminary result)   Collection Time: 02/08/17 10:34 PM  Result Value Ref Range Status   Specimen Description BLOOD RIGHT WRIST  Final   Special Requests   Final    BOTTLES DRAWN AEROBIC ONLY Blood Culture adequate volume   Culture   Final    NO GROWTH 4 DAYS Performed at [facility], [address], [place], [place] [address]    Report Status PENDING  Incomplete  MRSA PCR Screening     Status: None   Collection Time: 02/09/17 12:09 AM  Result Value Ref Range Status   MRSA by PCR NEGATIVE NEGATIVE Final    Comment:        The GeneXpert MRSA Assay (FDA approved for NASAL specimens only), is one component of a comprehensive MRSA  colonization surveillance program. It is not intended to diagnose MRSA infection nor to guide or monitor treatment for MRSA infections.   Culture, blood (routine x 2)     Status: None (Preliminary result)   Collection Time: 02/09/17 12:44 AM  Result Value Ref Range Status   Specimen Description BLOOD LEFT HAND  Final   Special Requests IN PEDIATRIC BOTTLE Blood Culture adequate volume  Final   Culture   Final    NO GROWTH 4 DAYS Performed at [facility], [address], [place], [place] [address]    Report Status PENDING  Incomplete       Radiology Studies: No results found.    Scheduled Meds: . apixaban  2.5 mg Oral BID  . atorvastatin  40 mg Oral q1800  . feeding supplement  1 Container Oral BID BM  . feeding supplement (ENSURE ENLIVE)  237 mL Oral Q24H  . furosemide  40 mg Intravenous Daily  . losartan  50 mg Oral Daily  . metoprolol tartrate  25 mg Oral BID  . multivitamin with minerals  1 tablet Oral Daily   Continuous Infusions:    LOS: 6 days    Time spent: 30 minutes   Noralee Stain, DO [facility] [email or web address] Password [email or web address] 02/14/2017, 1:34 PM

## 2017-02-15 ENCOUNTER — Inpatient Hospital Stay (HOSPITAL_COMMUNITY): Payer: Medicare Other

## 2017-02-15 ENCOUNTER — Encounter (HOSPITAL_COMMUNITY): Payer: Self-pay | Admitting: Radiology

## 2017-02-15 DIAGNOSIS — I639 Cerebral infarction, unspecified: Secondary | ICD-10-CM

## 2017-02-15 DIAGNOSIS — R2990 Unspecified symptoms and signs involving the nervous system: Secondary | ICD-10-CM

## 2017-02-15 DIAGNOSIS — I63423 Cerebral infarction due to embolism of bilateral anterior cerebral arteries: Secondary | ICD-10-CM

## 2017-02-15 LAB — BASIC METABOLIC PANEL
Anion gap: 11 (ref 5–15)
BUN: 47 mg/dL — ABNORMAL HIGH (ref 6–20)
CHLORIDE: 99 mmol/L — AB (ref 101–111)
CO2: 30 mmol/L (ref 22–32)
CREATININE: 0.76 mg/dL (ref 0.44–1.00)
Calcium: 9 mg/dL (ref 8.9–10.3)
GFR calc non Af Amer: >60 mL/min (ref >60)
GLUCOSE: 104 mg/dL — AB (ref 65–99)
Potassium: 4 mmol/L (ref 3.5–5.1)
Sodium: 140 mmol/L (ref 135–145)

## 2017-02-15 LAB — COMPREHENSIVE METABOLIC PANEL
ALBUMIN: 3.3 g/dL — AB (ref 3.5–5.0)
ALK PHOS: 70 U/L (ref 38–126)
ALT: 23 U/L (ref 14–54)
ANION GAP: 10 (ref 5–15)
AST: 34 U/L (ref 15–41)
BUN: 44 mg/dL — AB (ref 6–20)
CALCIUM: 9 mg/dL (ref 8.9–10.3)
CO2: 31 mmol/L (ref 22–32)
Chloride: 97 mmol/L — ABNORMAL LOW (ref 101–111)
Creatinine, Ser: 0.9 mg/dL (ref 0.44–1.00)
GFR calc Af Amer: >60 mL/min (ref >60)
GFR calc non Af Amer: 57 mL/min — ABNORMAL LOW (ref >60)
GLUCOSE: 101 mg/dL — AB (ref 65–99)
Potassium: 3.8 mmol/L (ref 3.5–5.1)
SODIUM: 138 mmol/L (ref 135–145)
Total Bilirubin: 1.3 mg/dL — ABNORMAL HIGH (ref 0.3–1.2)
Total Protein: 5.8 g/dL — ABNORMAL LOW (ref 6.5–8.1)

## 2017-02-15 LAB — CBC WITH DIFFERENTIAL/PLATELET
BASOS ABS: 0 10*3/uL (ref 0.0–0.1)
Basophils Relative: 0 %
Eosinophils Absolute: 0.2 10*3/uL (ref 0.0–0.7)
Eosinophils Relative: 2 %
HEMATOCRIT: 57.2 % — AB (ref 36.0–46.0)
HEMOGLOBIN: 19.1 g/dL — AB (ref 12.0–15.0)
LYMPHS PCT: 16 %
Lymphs Abs: 1.7 10*3/uL (ref 0.7–4.0)
MCH: 27.6 pg (ref 26.0–34.0)
MCHC: 33.4 g/dL (ref 30.0–36.0)
MCV: 82.8 fL (ref 78.0–100.0)
MONO ABS: 1.1 10*3/uL — AB (ref 0.1–1.0)
Monocytes Relative: 10 %
NEUTROS ABS: 8.1 10*3/uL — AB (ref 1.7–7.7)
Neutrophils Relative %: 73 %
Platelets: 146 10*3/uL — ABNORMAL LOW (ref 150–400)
RBC: 6.91 MIL/uL — ABNORMAL HIGH (ref 3.87–5.11)
RDW: 16.6 % — ABNORMAL HIGH (ref 11.5–15.5)
WBC: 11.1 10*3/uL — AB (ref 4.0–10.5)

## 2017-02-15 LAB — DIFFERENTIAL
BASOS ABS: 0 10*3/uL (ref 0.0–0.1)
BASOS PCT: 0 %
Eosinophils Absolute: 0.2 10*3/uL (ref 0.0–0.7)
Eosinophils Relative: 2 %
LYMPHS PCT: 11 %
Lymphs Abs: 1.2 10*3/uL (ref 0.7–4.0)
MONOS PCT: 10 %
Monocytes Absolute: 1.2 10*3/uL — ABNORMAL HIGH (ref 0.1–1.0)
NEUTROS ABS: 9 10*3/uL — AB (ref 1.7–7.7)
Neutrophils Relative %: 77 %

## 2017-02-15 LAB — CBC
HEMATOCRIT: 56.7 % — AB (ref 36.0–46.0)
HEMOGLOBIN: 18.5 g/dL — AB (ref 12.0–15.0)
MCH: 27.1 pg (ref 26.0–34.0)
MCHC: 32.6 g/dL (ref 30.0–36.0)
MCV: 83 fL (ref 78.0–100.0)
Platelets: 111 10*3/uL — ABNORMAL LOW (ref 150–400)
RBC: 6.83 MIL/uL — AB (ref 3.87–5.11)
RDW: 16.4 % — ABNORMAL HIGH (ref 11.5–15.5)
WBC: 11.6 10*3/uL — AB (ref 4.0–10.5)

## 2017-02-15 LAB — MAGNESIUM: Magnesium: 2.1 mg/dL (ref 1.7–2.4)

## 2017-02-15 LAB — TROPONIN I: Troponin I: 0.25 ng/mL (ref <0.03)

## 2017-02-15 LAB — HEPARIN LEVEL (UNFRACTIONATED): HEPARIN UNFRACTIONATED: 1.7 [IU]/mL — AB (ref 0.30–0.70)

## 2017-02-15 LAB — PHOSPHORUS: PHOSPHORUS: 2.3 mg/dL — AB (ref 2.5–4.6)

## 2017-02-15 LAB — APTT: aPTT: 24 seconds (ref 24–36)

## 2017-02-15 LAB — PROTIME-INR
INR: 1.27
PROTHROMBIN TIME: 15.8 s — AB (ref 11.4–15.2)

## 2017-02-15 MED ORDER — IOPAMIDOL (ISOVUE-370) INJECTION 76%
INTRAVENOUS | Status: AC
  Filled 2017-02-15: qty 100

## 2017-02-15 MED ORDER — LORAZEPAM 2 MG/ML IJ SOLN
INTRAMUSCULAR | Status: AC
  Filled 2017-02-15: qty 1

## 2017-02-15 MED ORDER — HYDRALAZINE HCL 20 MG/ML IJ SOLN: 5.0000 mg | Freq: Four times a day (QID) | INTRAMUSCULAR | Status: DC | PRN

## 2017-02-15 MED ORDER — IOPAMIDOL (ISOVUE-370) INJECTION 76%
100.0000 mL | Freq: Once | INTRAVENOUS | Status: AC | PRN
  Administered 2017-02-15: 100 mL via INTRAVENOUS

## 2017-02-15 MED ORDER — LORAZEPAM 2 MG/ML IJ SOLN
0.5000 mg | Freq: Once | INTRAMUSCULAR | Status: AC | PRN
  Administered 2017-02-15: 0.5 mg via INTRAVENOUS

## 2017-02-15 MED ORDER — LORAZEPAM 2 MG/ML IJ SOLN
0.5000 mg | INTRAMUSCULAR | Status: AC
  Administered 2017-02-15: 0.5 mg via INTRAVENOUS

## 2017-02-15 MED ORDER — STROKE: EARLY STAGES OF RECOVERY BOOK: Freq: Once | Status: DC

## 2017-02-15 MED ORDER — HEPARIN (PORCINE) IN NACL 100-0.45 UNIT/ML-% IJ SOLN
600.0000 [IU]/h | INTRAMUSCULAR | Status: DC
  Administered 2017-02-15: 600 [IU]/h via INTRAVENOUS
  Filled 2017-02-15: qty 250

## 2017-02-15 MED ORDER — DILTIAZEM HCL-DEXTROSE 100-5 MG/100ML-% IV SOLN (PREMIX)
5.0000 mg/h | INTRAVENOUS | Status: DC
  Administered 2017-02-16 (×2): 5 mg/h via INTRAVENOUS
  Administered 2017-02-16: 7.5 mg/h via INTRAVENOUS
  Administered 2017-02-17 (×2): 5 mg/h via INTRAVENOUS
  Filled 2017-02-15 (×5): qty 100

## 2017-02-15 MED ORDER — FLUMAZENIL 0.5 MG/5ML IV SOLN
0.2000 mg | Freq: Once | INTRAVENOUS | Status: AC
  Administered 2017-02-15: 0.2 mg via INTRAVENOUS
  Filled 2017-02-15: qty 5

## 2017-02-15 NOTE — Progress Notes (Signed)
Neurology Consultation  Reason for Consult: acute code stroke Referring Physician: Dr. Elijah Birk  CC: not following commands  History is obtained from:chart, primary team  HPI: Rachel [name] is a 83 y.o. female was a past medical history of hypert cardiac arrhythmia who was evaluated in the emergency room at [facility] for generalized weakness and malaise.She was noted to have a change in mentation somewhere around 1 PM yesterday. She was examined by the primary team this morning and was noted to be aphasic and left gaze preference. A code stroke was called. A stat non-contrast CT of the head was done. Next line also of note, the patient is on Eliquis for atrial fibrillation and LV thrombus. She was out of the TPA window because of being on Eliquis as well as last known normal greater than 4-1/2 hours.  I'll obtain a stat CT of the head and neck, that did not show any large vessel occlusion, hence she was not a candidate for endovascular treatment.  Patient's mental status change is confirmed by the caregiver, and has been ongoing since 1 PM yesterday. A code stroke was activated today, 02/15/2017 at 10 AM.   LKW: 1 PM on 02/14/2017 tpa given?: no, outside the window, Eliquis Premorbid modified Rankin scale (mRS):  4-Needs assistance to walk and tend to bodily needs  [id number] to obtain due to altered mental status.   Past Medical History:  Diagnosis Date  . Arthritis    OA  . Fracture of femoral neck (HCC) 02/11/2016   LEFT  . Hypertension     Family History  Problem Relation Age of Onset  . Family history unknown: Yes    Social History:   reports that she has never smoked. She has never used smokeless tobacco. She reports that she does not drink alcohol or use drugs.   Medications  Current Facility-Administered Medications:  .   stroke: mapping our early stages of recovery book, , Does not apply, Once, Zigmund Daniel., MD .  acetaminophen (TYLENOL)  tablet 650 mg, 650 mg, Oral, Q4H PRN, Opyd, Lavone Neri, MD .  apixaban (ELIQUIS) tablet 2.5 mg, 2.5 mg, Oral, BID, Phylliss Blakes, RPH, 2.5 mg at 02/14/17 2058 .  atorvastatin (LIPITOR) tablet 40 mg, 40 mg, Oral, q1800, Opyd, Lavone Neri, MD, 40 mg at 02/14/17 1710 .  feeding supplement (BOOST / RESOURCE BREEZE) liquid 1 Container, 1 Container, Oral, BID BM, Noralee Stain Chahn-Yang, DO, 1 Container at 02/14/17 1711 .  feeding supplement (ENSURE ENLIVE) (ENSURE ENLIVE) liquid 237 mL, 237 mL, Oral, Q24H, Choi, Jennifer Chahn-Yang, DO, 237 mL at 02/14/17 1423 .  furosemide (LASIX) injection 40 mg, 40 mg, Intravenous, Daily, Rinaldo Cloud, MD, 40 mg at 02/14/17 1110 .  Influenza vac split quadrivalent PF (FLUZONE HIGH-DOSE) injection 0.5 mL, 0.5 mL, Intramuscular, Prior to discharge, Noralee Stain Chahn-Yang, DO .  losartan (COZAAR) tablet 50 mg, 50 mg, Oral, Daily, Rinaldo Cloud, MD, 50 mg at 02/14/17 1108 .  metoprolol tartrate (LOPRESSOR) tablet 25 mg, 25 mg, Oral, BID, Rinaldo Cloud, MD, 25 mg at 02/14/17 2058 .  multivitamin with minerals tablet 1 tablet, 1 tablet, Oral, Daily, Noralee Stain Chahn-Yang, DO, 1 tablet at 02/14/17 1108 .  nitroGLYCERIN (NITROSTAT) SL tablet 0.4 mg, 0.4 mg, Sublingual, Q5 Min x 3 PRN, Opyd, Timothy S, MD .  ondansetron (ZOFRAN) injection 4 mg, 4 mg, Intravenous, Q6H PRN, Opyd, Lavone Neri, MD  Exam: Current vital signs: BP (!) 175/66 (BP Location: Right Arm)  Pulse 88   Temp 97.7 F (36.5 C) (Oral)   Resp 20   Ht 5' (1.524 m)   Wt 51.4 kg (113 lb 5.1 oz)   SpO2 97%   BMI 22.13 kg/m  Vital signs in last 24 hours: Temp:  [97.6 F (36.4 C)-98.1 F (36.7 C)] 97.7 F (36.5 C) (09/19 0923) Pulse Rate:  [61-98] 88 (09/19 0923) Resp:  [16-20] 20 (09/19 0923) BP: (141-187)/(62-101) 175/66 (09/19 0923) SpO2:  [97 %-100 %] 97 % (09/19 0923) Weight:  [51.4 kg (113 lb 5.1 oz)] 51.4 kg (113 lb 5.1 oz) (09/19 0640)  GENERAL: Awake, alert in NAD HEENT: -  Normocephalic and atraumatic, dry mm, no LN++, no Thyromegally LUNGS - Clear to auscultation bilaterally with no wheezes CV - S1S2 RRR, no m/r/g, equal pulses bilaterally. ABDOMEN - Soft, nontender, nondistended with normoactive BS Ext: warm, well perfused, intact peripheral pulses, no edema  NEURO:  Mental Status: awake, alert, not following commands. Language: speech is mildly dysarthric,complete word salad, unable to tell me her name or age Cranial Nerves: PERRLmm/brisk. Has left gaze preference, possible bilateral hemianopsia, right facial droop Motor: antigravity in all fours  Tone: is normal and bulk is normal Sensation- Intact to light touch bilaterally Coordination: unable to follow commands to perfor formal testing Gait- deferred  NIHSS 1a Level of Conscious.: 0 1b LOC Questions: 2 1c LOC Commands: 0 2 Best Gaze: 1 3 Visual: 2 4 Facial Palsy: 1 5a Motor Arm - left: 1 5b Motor Arm - Right: 1 6a Motor Leg - Left: 1 6b Motor Leg - Right: 1 7 Limb Ataxia: 0 8 Sensory: 0 9 Best Language: 2 10 Dysarthria: 1 11 Extinct. and Inatten.: 0 TOTAL: 13   Labs I have reviewed labs in epic and the results pertinent to this consultation are:  CBC    Component Value Date/Time   WBC 11.1 (H) 02/15/2017 0416   RBC 6.91 (H) 02/15/2017 0416   HGB 19.1 (H) 02/15/2017 0416   HCT 57.2 (H) 02/15/2017 0416   PLT 146 (L) 02/15/2017 0416   MCV 82.8 02/15/2017 0416   MCH 27.6 02/15/2017 0416   MCHC 33.4 02/15/2017 0416   RDW 16.6 (H) 02/15/2017 0416   LYMPHSABS 1.7 02/15/2017 0416   MONOABS 1.1 (H) 02/15/2017 0416   EOSABS 0.2 02/15/2017 0416   BASOSABS 0.0 02/15/2017 0416    CMP     Component Value Date/Time   NA 140 02/15/2017 0416   K 4.0 02/15/2017 0416   CL 99 (L) 02/15/2017 0416   CO2 30 02/15/2017 0416   GLUCOSE 104 (H) 02/15/2017 0416   BUN 47 (H) 02/15/2017 0416   CREATININE 0.76 02/15/2017 0416   CALCIUM 9.0 02/15/2017 0416   PROT 5.7 (L) 02/08/2017 2043    ALBUMIN 3.7 02/08/2017 2043   AST 45 (H) 02/08/2017 2043   ALT 28 02/08/2017 2043   ALKPHOS 80 02/08/2017 2043   BILITOT 1.2 02/08/2017 2043   GFRNONAA >60 02/15/2017 0416   GFRAA >60 02/15/2017 0416    Lipid Panel  No results found for: CHOL, TRIG, HDL, CHOLHDL, VLDL, LDLCALC, LDLDIRECT   Imaging I have reviewed the images obtained:  CT-scan of the brain - no acute changes No large vessel occlusion on CTA  MRI examination of the brain - Scattered embolic looking infarcts in bilateral cerebral hemispheres and also in the cerebellum.  Echo Study Conclusions  - Left ventricle: The cavity size was normal. There was mild   concentric hypertrophy. Systolic  function was severely reduced.   The estimated ejection fraction was in the range of 25% to 30%.   There is severe hypokinesis of the anteroseptal and anterior   myocardium. There is moderate hypokinesis of the lateral and   inferior myocardium. - Aortic valve: There was mild to moderate regurgitation. - Mitral valve: Calcified annulus. Mildly thickened, mildly   calcified leaflets . There was mild regurgitation. - Left atrium: The atrium was moderately dilated. There was an   apparent, small, 2.4 cm (L) x 1.8 cm (W), regular, solid, fixed   mass in the appendage; the appearance is consistent with   thrombus; based on its appearance, tumor cannot be excluded. - Right ventricle: The cavity size was mildly dilated. Wall   thickness was normal. Systolic function was mildly reduced. - Right atrium: The atrium was severely dilated. - Tricuspid valve: There was severe regurgitation. - Pulmonary arteries: Systolic pressure was moderately increased.   PA peak pressure: 60 mm Hg (S). - Pericardium, extracardiac: A small, partially loculated   pericardial effusion was identified.  Assessment:  83 year old woman with a past medical history of hypertension, arthritis, cardiac arrhythmia who was admitted at [facility] for  newfound atrial fibrillation with RVR, also found to have a left ventricular thrombus, and an ejection fraction of 20-25% on echo cardiogram, with acute change of mental status sometime 1 PM on 02/14/2017.   code stroke called on 02/15/2017 after 10 AM Not much in terms of changes on noncontrast CT of the head to suggest acute strokes but the MRI of the brain shows scattered embolic strokes.  Most likely cardio embolic from the LV thrombus.  Impression: Acute ischemic stroke LV thrombus   Recommendations: HgbA1c, fasting lipid panel Frequent neuro checks Risk factor modification Telemetry monitoring PT consult, OT consult, Speech consult For her anticoagulation, I would recommend doing heparin drip no bolus, PTT: 55-70. Decision on resumption of NOAC following clinical course and  Stroke team evaluation in the morning  patient will be transferred to [facility]  Stroke team will follow in the morning   please page stroke NP  Or  PA  Or MD  from 8am -4 pm as this patient will be followed by the stroke team at this point.   You can look them up on [email or web address]     -- Milon Dikes, MD [facility] [phone]  If 7pm to 7am, please call on call as listed on AMION.

## 2017-02-15 NOTE — Progress Notes (Signed)
Patient sedate post MRI. MD paged and Romazicon given as ordered. Patient initially more alert and moving all four extremities. MD at bedside.

## 2017-02-15 NOTE — Progress Notes (Signed)
PROGRESS NOTE    Rachel [name]  UJW:[id number] DOB: 1931-01-04 DOA: 02/08/2017 PCP: Renford Dills, MD (Confirm with patient/family/NH records and if not entered, this HAS to be entered at [facility] point of entry. "No PCP" if truly none.)   Brief Narrative: (Start on day 1 of progress note - keep it brief and live) Rachel [name] a 83 y.o.femalewith medical history significant for hypertension, osteoarthritis, and history of cardiac arrhythmia, now presenting to the emergency department for evaluation of generalized weakness and malaise. Patient was accompanied by her caretaker reporting that the patient has been experiencing increasing generalized weakness over the past couple days, much worse today. Patient was seated on the toilet, but was too weak to get up, prompting the caretaker to call EMS for evaluation. Patient required significant assistance just to stand and was brought into the ED for evaluation. In the ED, she was found to be in A Fib with tachycardia, started on cardizem gtt. She was also found to be fluid overloaded with BNP 3637 and was given IV lasix. She underwent echocardiogram which showed reduced systolic function EF 25% along with severe hypokinesis of anteroseptal and anterior myocardium. Also found a small thrombus in the left atrium. Cardiology was consulted.   Code stroke called 9/19 due to new aphasia, L gaze preference.  Neurology c/s.  Stat head CT pending.  First noticed abnormal around 130 PM yesterday by one of her caregivers.     Assessment & Plan:   Principal Problem:   New onset atrial fibrillation (HCC) Active Problems:   HTN (hypertension)   CAP (community acquired pneumonia)   Renal insufficiency   NSTEMI (non-ST elevated myocardial infarction) (HCC)   Acute CHF (congestive heart failure) (HCC)   General weakness   Malnutrition of moderate degree   Stroke Like Sx:  First noted abnormal yesterday by one of caregivers ([name] told me she was told  around 130 PM things were noted to be abnormal).  Aphasia on exam, L gaze preference.  Difficult to test strength due to inconsistent following commands.   Code stroke Neurology following, appreciate Dr. Wilford Corner assistance Head CT, stat MRI, CTA head/neck Stroke labs Not TPA candidate due to unknown last normal and on eliquis  New onset A fib with RVR  -CHADS-VASc is at least 4 -Started eliquis -Rate well controlled on lopressor  Elevated WBC and Hb  Mild thrombocytopenia:  - repeat CBC with stroke labs above - continue to monitor  Acute systolic and diastolic CHF -BNP 3637 on admission -Cardiology consulted  -Lasix IV  daily, started cozaar and lopressor -Strict I/O, daily weight -Continues to be very edematous. Continue lasix IV   Elevated troponin -Could be due to demand ischemia in setting of infection, A Fib, CHF -0.25 --> 0.32 --> 0.25 -Not a candidate for heart cath   Right upper lobar pneumonia -Blood cultures negative to date  -Deescalate antibiotics to ceftin. Completed therapy.    AKI on CKD stage 3  -Baseline Cr 0.6  -Trend BMP, stable currently   Concern for refeeding syndrome -Continue to monitor K, Mg, Phos   PVD -Left ABIs and Doppler waveforms indicate a severe reduction in arterial flow at rest. Not sure that patient is a good candidate for any further procedure. Continue eliquis.   DVT prophylaxis: eliquis Code Status: DNR Family Communication: called Harrie Foreman (listed emergency contact) Disposition Plan: pending  Consultants:   Neurology  cardiology  Procedures: (Don't include imaging studies which can be auto populated. Include  things that cannot be auto populated i.e. Echo, Carotid and venous dopplers, Foley, Bipap, HD, tubes/drains, wound vac, central lines etc)  Echo - EF 25-30%, L atrium with fixed mass c/w thrombus (tumor cannot be excluded), PASP increased to 60 (see report for additional details)  R ABI with normal  arterial flow at rest, L ABI with severe reduction in arterial flow at rest  Antimicrobials: (specify start and planned stop date. Auto populated tables are space occupying and do not give end dates)  none    Subjective: Intelligible speech  Objective: Vitals:   02/14/17 2225 02/14/17 2351 02/15/17 0640 02/15/17 0923  BP: (!) 153/91 (!) 187/101 (!) 150/100 (!) 175/66  Pulse: 61  98 88  Resp: Temp: 97.8 F (36.6 C)  98.1 F (36.7 C) 97.7 F (36.5 C)  TempSrc: Axillary  Oral Oral  SpO2: 98%  99% 97%  Weight:   51.4 kg (113 lb 5.1 oz)   Height:        Intake/Output Summary (Last 24 hours) at 02/15/17 1011 Last data filed at 02/14/17 2200  Gross per 24 hour  Intake              120 ml  Output              150 ml  Net              -30 ml   Filed Weights   02/13/17 0516 02/14/17 0557 02/15/17 0640  Weight: 49.9 kg (110 lb 0.2 oz) 51.2 kg (112 lb 14 oz) 51.4 kg (113 lb 5.1 oz)    Examination:  General exam: Appears calm and comfortable  Respiratory system: Clear to auscultation. Respiratory effort normal. Cardiovascular system: S1 & S2 heard, RRR. No JVD, murmurs, rubs, gallops or clicks.  Gastrointestinal system: Abdomen is nondistended, soft and nontender. No organomegaly or masses felt. Normal bowel sounds heard. Central nervous system: Unable to test orientation due to aphasia.  L gaze preference.  Strength difficult to test as inconsistent in following commands, but able to grip hands and lift legs off of bed. Extremities: 1+ LEE Skin: No rashes, lesions or ulcers    Data Reviewed: I have personally reviewed following labs and imaging studies  CBC:  Recent Labs Lab 02/08/17 1741 02/09/17 0202 02/10/17 0122 02/11/17 0454 02/15/17 0416  WBC 12.1* 10.1 9.3 9.4 11.1*  NEUTROABS  --   --  7.1 7.1 8.1*  HGB 17.5* 17.7* 16.8* 16.8* 19.1*  HCT 52.8* 53.9* 50.9* 51.7* 57.2*  MCV 81.4 82.8 82.2 81.0 82.8  PLT 120* 101* 115* 122* 146*   Basic  Metabolic Panel:  Recent Labs Lab 02/11/17 0454 02/12/17 0442 02/13/17 0500 02/14/17 0448 02/15/17 0416  NA 142 142 138 139 140  K 4.5 3.1* 4.2 4.0 4.0  CL 104 100* 99* 100* 99*  CO2 30 32 GLUCOSE 99 103* 92 87 104*  BUN 43* 40* 41* 48* 47*  CREATININE 1.00 0.99 0.79 0.82 0.76  CALCIUM 9.0 9.0 8.7* 8.6* 9.0  MG 1.7 1.6* 1.7 1.7 2.1  PHOS  --  2.5 2.4* 2.2* 2.3*   GFR: Estimated Creatinine Clearance: 37.6 mL/min (by C-G formula based on SCr of 0.76 mg/dL). Liver Function Tests:  Recent Labs Lab 02/08/17 2043  AST 45*  ALT 28  ALKPHOS 80  BILITOT 1.2  PROT 5.7*  ALBUMIN 3.7   No results for input(s): LIPASE, AMYLASE in the last 168 hours. No  results for input(s): AMMONIA in the last 168 hours. Coagulation Profile:  Recent Labs Lab 02/08/17 2248  INR 1.29   Cardiac Enzymes:  Recent Labs Lab 02/09/17 0202 02/09/17 0822 02/09/17 1348  TROPONINI 0.32* 0.25* 0.26*   BNP (last 3 results) No results for input(s): PROBNP in the last 8760 hours. HbA1C: No results for input(s): HGBA1C in the last 72 hours. CBG:  Recent Labs Lab 02/08/17 2104  GLUCAP 103*   Lipid Profile: No results for input(s): CHOL, HDL, LDLCALC, TRIG, CHOLHDL, LDLDIRECT in the last 72 hours. Thyroid Function Tests: No results for input(s): TSH, T4TOTAL, FREET4, T3FREE, THYROIDAB in the last 72 hours. Anemia Panel: No results for input(s): VITAMINB12, FOLATE, FERRITIN, TIBC, IRON, RETICCTPCT in the last 72 hours. Sepsis Labs: No results for input(s): PROCALCITON, LATICACIDVEN in the last 168 hours.  Recent Results (from the past 240 hour(s))  Culture, blood (routine x 2)     Status: None   Collection Time: 02/08/17 10:34 PM  Result Value Ref Range Status   Specimen Description BLOOD RIGHT WRIST  Final   Special Requests   Final    BOTTLES DRAWN AEROBIC ONLY Blood Culture adequate volume   Culture   Final    NO GROWTH 5 DAYS Performed at [facility], [address] [place], [place] [address]    Report Status 02/14/2017 FINAL  Final  MRSA PCR Screening     Status: None   Collection Time: 02/09/17 12:09 AM  Result Value Ref Range Status   MRSA by PCR NEGATIVE NEGATIVE Final    Comment:        The GeneXpert MRSA Assay (FDA approved for NASAL specimens only), is one component of a comprehensive MRSA colonization surveillance program. It is not intended to diagnose MRSA infection nor to guide or monitor treatment for MRSA infections.   Culture, blood (routine x 2)     Status: None   Collection Time: 02/09/17 12:44 AM  Result Value Ref Range Status   Specimen Description BLOOD LEFT HAND  Final   Special Requests IN PEDIATRIC BOTTLE Blood Culture adequate volume  Final   Culture   Final    NO GROWTH 5 DAYS Performed at [facility], [address] [place], [place] [address]    Report Status 02/14/2017 FINAL  Final         Radiology Studies: No results found.      Scheduled Meds: .  stroke: mapping our early stages of recovery book   Does not apply Once  . apixaban  2.5 mg Oral BID  . atorvastatin  40 mg Oral q1800  . feeding supplement  1 Container Oral BID BM  . feeding supplement (ENSURE ENLIVE)  237 mL Oral Q24H  . furosemide  40 mg Intravenous Daily  . losartan  50 mg Oral Daily  . metoprolol tartrate  25 mg Oral BID  . multivitamin with minerals  1 tablet Oral Daily   Continuous Infusions:   LOS: 7 days    Time spent: over 30 minutes    Lacretia Nicks, MD [facility] [phone]   If 7PM-7AM, please contact night-coverage [email or web address] Password [email or web address] 02/15/2017, 10:11 AM

## 2017-02-15 NOTE — Treatment Plan (Addendum)
83 y.o.femalewith medical history significant for hypertension, osteoarthritis, and history of cardiac arrhythmia, who presented to the emergency department for evaluation of generalized weakness and malaise ofund to have afib with RVR with HFrEF (25% with severe hypokinesis of anteroseptal and anterior myocardium with thrombus in L atrium).  Ether Wolters code stroke was called on 9/19 due to new aphasia, L gaze preference.  MRI notable for widespread multifocal areas of acute infarction throughout both cerebral hemispheres as well as the R cerebellar hemisphere.  Plan for transfer to [facility] per neurology.    See progress note as well and neurology c/s note.  Stroke:  First noted abnormal yesterday by one of caregivers [name] told me she was told around 130 PM things were noted to be abnormal).  Aphasia on exam, L gaze preference.  Difficult to test strength due to inconsistent following commands.   Code stroke Neurology following, appreciate Dr. Wilford Corner assistance Head CT, stat MRI, CTA head/neck - notable for MRI with multifocal areas of acute infarction Stroke labs Holding cozaar, treat BP's over 160/105 per neurology given on heparin gtt, hydral IV ordered Not TPA candidate due to unknown last normal and on eliquis  New onset Akili Corsetti fib with RVR  -CHADS-VASc is at least 4 -heparin -Rate well controlled on lopressor  Elevated WBC and Hb  Mild thrombocytopenia:  - repeat CBC with stroke labs above - continue to monitor  Acute systolic and diastolic CHF -BNP 3637 on admission -Cardiology consulted  -Lasix IV 40mg  daily, currently holding IV lasix, holding cozaar, holding metop - dilt gtt ordered as below -Strict I/O, daily weight -Continues to be very edematous. Continue lasix IV   Elevated troponin -Could be due to demand ischemia in setting of infection, Iyari Hagner Fib, CHF -0.25 --> 0.32 --> 0.25 -Not Powell Halbert candidate for heart cath   Right upper lobar pneumonia -Blood cultures negative to date   -Deescalate antibiotics to ceftin. Completed therapy.  [ ]  CT neck notable for bilateral layering effusions, mod/large on R, would follow up repeat CXR after transfer  AKI on CKD stage 3  -Baseline Cr 0.6  -Trend BMP, stable currently   Concern for refeeding syndrome -Continue to monitor K, Mg, Phos   PVD -Left ABIs and Doppler waveforms indicate Zamyra Allensworth severe reduction in arterial flow at rest. Not sure that patient is Jazzalyn Loewenstein good candidate for any further procedure. Continue eliquis.   Addendum:  Of note, since receiving ativan in MRI has been sedated.  Has required flumazenil 0.2 mg x2, each time becoming more responsive, moving all extremities, but still with L gaze, aphasia, inconsistently following instructions so exam difficult.  Discussed with nursing overnight, if becomes somnolent again can discuss with overnight MD for additional flumazenil if thought necessary for exam.  Dilt ggt also ordered for afib, currently holding with HR <110 and permissive HTN in setting of stroke (though tighter control with heparin gtt), but if HR increases into 110s, recommended adding this on.

## 2017-02-15 NOTE — Progress Notes (Signed)
Pt has a mental status change. Speech is a word salad. Not feeding herself breakfast. A code stroke was called.

## 2017-02-15 NOTE — Progress Notes (Signed)
PT Cancellation Note  Patient Details Name: Rachel [name] MRN: [id number] DOB: [birth year]   Cancelled Treatment:     medical decline and transferred to ICU.  Will check back another day as schedule permits.    Felecia Shelling  PTA [facility]  Acute  Rehab Pager      [phone]

## 2017-02-15 NOTE — Progress Notes (Signed)
Taking over care of patient. Agree with previous RN assessment. Denies any needs at this time. Will continue to monitor.  

## 2017-02-15 NOTE — Progress Notes (Signed)
ANTICOAGULATION CONSULT NOTE - Initial Consult  Pharmacy Consult for Heparin Indication: atrial fibrillation, CVA  Allergies  Allergen Reactions  . Aspirin     nosebleed    Patient Measurements: Height: 5' (152.4 cm) Weight: 113 lb 5.1 oz (51.4 kg) IBW/kg (Calculated) : 45.5 Heparin Dosing Weight: actual weight  Vital Signs: Temp: 97.7 F (36.5 C) (09/19 0923) Temp Source: Oral (09/19 0923) BP: 175/66 (09/19 0923) Pulse Rate: 88 (09/19 0923)  Labs:  Recent Labs  02/13/17 0500 02/14/17 0448 02/15/17 0416  HGB  --   --  19.1*  HCT  --   --  57.2*  PLT  --   --  146*  CREATININE 0.79 0.82 0.76    Estimated Creatinine Clearance: 37.6 mL/min (by C-G formula based on SCr of 0.76 mg/dL).   Medical History: Past Medical History:  Diagnosis Date  . Arthritis    OA  . Fracture of femoral neck (HCC) 02/11/2016   LEFT  . Hypertension     Medications:  Scheduled:  .  stroke: mapping our early stages of recovery book   Does not apply Once  . atorvastatin  40 mg Oral q1800  . feeding supplement  1 Container Oral BID BM  . feeding supplement (ENSURE ENLIVE)  237 mL Oral Q24H  . furosemide  40 mg Intravenous Daily  . iopamidol      . losartan  50 mg Oral Daily  . metoprolol tartrate  25 mg Oral BID  . multivitamin with minerals  1 tablet Oral Daily   Infusions:   PRN: acetaminophen, hydrALAZINE, Influenza vac split quadrivalent PF, nitroGLYCERIN, ondansetron (ZOFRAN) IV  Assessment: 83 yo female admitted with new onset atrial fibrillation who was started on apixaban. Code stroke called 9/19 due to new aphasia with left gaze preference. Neurology consulted, STAT head CT and MRI obtained and Pharmacy consulted to dose IV heparin.   Last dose of apixaban given 9/18 at 20:58 Baseline PTT pending Baseline PT/INR pending (likely to be elevated with recent DOAC use) Baseline Heparin level pending (likely to be elevated with recent DOAC use) CBC: H/H elevated, Plts low  but stable SCr: 0.76, CrCl ~37 ml/min  Goal of Therapy:  Heparin level 0.3-0.5 units/ml  PTT 66-85 seconds Monitor platelets by anticoagulation protocol: Yes   Plan:   No heparin bolus  IV Heparin infusion 600 units/hr  Check PTT and heparin level 8hr after starting drip - titrate heparin using PTT as the effects of recent apixaban use will elevate heparin level  Check daily PTT and heparin level until both levels correlate and effects of apixaban have diminished, then may titrate heparin based on heparin level only   Loralee Pacas, PharmD, BCPS Pager: [phone] 02/15/2017,1:25 PM

## 2017-02-15 NOTE — Progress Notes (Signed)
Rachel [name]- tel: [phone]. Pt family friend- requesting RN call prior to transfer to [facility].

## 2017-02-15 NOTE — Progress Notes (Signed)
I took over  Care of pt around 0200. Pt slept until I awakened her at 0630 for a bath and Purewick change.  I have not had pt before but NT states that she is different.  Her speech is incomprehensible. PEARL but unable to test movement of eyes because she can not understand the directions. She also could not understand the direction to stick her tongue out. She did open her mouth wide but that is all. She was able to hold her arms out straight with no drift. She did not understand the directions to flex and extend her feet. Will report off to oncoming RN, MD should be on floor soon. Kaveon Blatz P Peyton Rossner

## 2017-02-15 NOTE — Evaluation (Signed)
SLP Cancellation Note  Patient Details Name: CHAVONE CRUTCHER MRN: [id number] DOB: 1931-03-16   Cancelled treatment:       Reason Eval/Treat Not Completed: Patient at procedure or test/unavailable (pt at MRI, will continue efforts)   Donavan Burnet, MS [facility] SLP [phone]

## 2017-02-15 NOTE — Progress Notes (Signed)
CMT notified RN of pt having 30 beat run of VTach. VS obtained, denies CP. NP on call notified. Will continue to monitor.

## 2017-02-16 ENCOUNTER — Inpatient Hospital Stay (HOSPITAL_COMMUNITY): Payer: Medicare Other

## 2017-02-16 DIAGNOSIS — J90 Pleural effusion, not elsewhere classified: Secondary | ICD-10-CM

## 2017-02-16 LAB — COMPREHENSIVE METABOLIC PANEL
ALT: 20 U/L (ref 14–54)
AST: 35 U/L (ref 15–41)
Albumin: 2.9 g/dL — ABNORMAL LOW (ref 3.5–5.0)
Alkaline Phosphatase: 61 U/L (ref 38–126)
Anion gap: 11 (ref 5–15)
BUN: 42 mg/dL — ABNORMAL HIGH (ref 6–20)
CHLORIDE: 99 mmol/L — AB (ref 101–111)
CO2: 29 mmol/L (ref 22–32)
Calcium: 9.1 mg/dL (ref 8.9–10.3)
Creatinine, Ser: 0.78 mg/dL (ref 0.44–1.00)
Glucose, Bld: 93 mg/dL (ref 65–99)
POTASSIUM: 4.3 mmol/L (ref 3.5–5.1)
Sodium: 139 mmol/L (ref 135–145)
Total Bilirubin: 1.1 mg/dL (ref 0.3–1.2)
Total Protein: 5.1 g/dL — ABNORMAL LOW (ref 6.5–8.1)

## 2017-02-16 LAB — LIPID PANEL
Cholesterol: 146 mg/dL (ref 0–200)
HDL: 46 mg/dL (ref >40)
LDL CALC: 83 mg/dL (ref 0–99)
TRIGLYCERIDES: 85 mg/dL (ref <150)
Total CHOL/HDL Ratio: 3.2 RATIO
VLDL: 17 mg/dL (ref 0–40)

## 2017-02-16 LAB — PROTIME-INR
INR: 1.34
PROTHROMBIN TIME: 16.5 s — AB (ref 11.4–15.2)

## 2017-02-16 LAB — HEPARIN LEVEL (UNFRACTIONATED)
HEPARIN UNFRACTIONATED: 1.54 [IU]/mL — AB (ref 0.30–0.70)
HEPARIN UNFRACTIONATED: 1.7 [IU]/mL — AB (ref 0.30–0.70)
HEPARIN UNFRACTIONATED: 1.76 [IU]/mL — AB (ref 0.30–0.70)

## 2017-02-16 LAB — HEMOGLOBIN A1C
Hgb A1c MFr Bld: 5.8 % — ABNORMAL HIGH (ref 4.8–5.6)
Mean Plasma Glucose: 119.76 mg/dL

## 2017-02-16 LAB — CBC
HCT: 60 % — ABNORMAL HIGH (ref 36.0–46.0)
HEMOGLOBIN: 19.2 g/dL — AB (ref 12.0–15.0)
MCH: 27.3 pg (ref 26.0–34.0)
MCHC: 32 g/dL (ref 30.0–36.0)
MCV: 85.3 fL (ref 78.0–100.0)
Platelets: 132 10*3/uL — ABNORMAL LOW (ref 150–400)
RBC: 7.03 MIL/uL — ABNORMAL HIGH (ref 3.87–5.11)
RDW: 16.6 % — ABNORMAL HIGH (ref 11.5–15.5)
WBC: 9.2 10*3/uL (ref 4.0–10.5)

## 2017-02-16 LAB — APTT
APTT: 151 s — AB (ref 24–36)
aPTT: 142 seconds — ABNORMAL HIGH (ref 24–36)
aPTT: 40 seconds — ABNORMAL HIGH (ref 24–36)

## 2017-02-16 MED ORDER — HEPARIN (PORCINE) IN NACL 100-0.45 UNIT/ML-% IJ SOLN
600.0000 [IU]/h | INTRAMUSCULAR | Status: DC
  Administered 2017-02-16 – 2017-02-17 (×3): 600 [IU]/h via INTRAVENOUS
  Filled 2017-02-16: qty 250

## 2017-02-16 MED ORDER — CHLORHEXIDINE GLUCONATE CLOTH 2 % EX PADS
6.0000 | MEDICATED_PAD | Freq: Every day | CUTANEOUS | Status: DC
  Administered 2017-02-18 – 2017-02-19 (×2): 6 via TOPICAL

## 2017-02-16 MED ORDER — ORAL CARE MOUTH RINSE
15.0000 mL | Freq: Two times a day (BID) | OROMUCOSAL | Status: DC
  Administered 2017-02-16 – 2017-02-20 (×8): 15 mL via OROMUCOSAL

## 2017-02-16 MED ORDER — HEPARIN (PORCINE) IN NACL 100-0.45 UNIT/ML-% IJ SOLN: 700.0000 [IU]/h | INTRAMUSCULAR | Status: DC

## 2017-02-16 MED ORDER — HEPARIN (PORCINE) IN NACL 100-0.45 UNIT/ML-% IJ SOLN: 600.0000 [IU]/h | INTRAMUSCULAR | Status: DC

## 2017-02-16 MED ORDER — SODIUM CHLORIDE 0.9% FLUSH: 10.0000 mL | INTRAVENOUS | Status: DC | PRN

## 2017-02-16 MED ORDER — SODIUM CHLORIDE 0.9% FLUSH
10.0000 mL | Freq: Two times a day (BID) | INTRAVENOUS | Status: DC
  Administered 2017-02-17 (×2): 20 mL
  Administered 2017-02-17: 10 mL
  Administered 2017-02-18: 20 mL
  Administered 2017-02-18: 10 mL
  Administered 2017-02-19: 40 mL
  Administered 2017-02-19: 10 mL

## 2017-02-16 NOTE — Consult Note (Signed)
Subjective: Patient is alert and able to tell me she Is in [place]. Able to lift bilateral arms on command. Very lethargic.   Objective: Current vital signs: BP 115/77   Pulse 79   Temp (!) 97.3 F (36.3 C) (Oral)   Resp (!) 21   Ht 5' (1.524 m)   Wt 50.9 kg (112 lb 3.4 oz)   SpO2 100%   BMI 21.92 kg/m  Vital signs in last 24 hours: Temp:  [96.6 F (35.9 C)-97.3 F (36.3 C)] 97.3 F (36.3 C) (09/20 1200) Pulse Rate:  [59-101] 79 (09/20 1200) Resp:  [20-30] 21 (09/20 1200) BP: (115-170)/(26-112) 115/77 (09/20 1200) SpO2:  [100 %] 100 % (09/20 1200) Weight:  [50.9 kg (112 lb 3.4 oz)] 50.9 kg (112 lb 3.4 oz) (09/20 0434)  Intake/Output from previous day: 09/19 0701 - 09/20 0700 In: 60 [I.V.:60] Out: 2140 [Urine:2140] Intake/Output this shift: Total I/O In: 45.7 [I.V.:45.7] Out: 170 [Urine:170] Nutritional status: Diet NPO time specified  ROS:                                                                                                                                       History obtained from unobtainable from patient due to mental status   Neurologic Exam: General: NAD Mental Status: Alert, oriented to [place].  Speech hypophonic with periods of nonsensical speech.  Able to follow simple commands without difficulty. Cranial Nerves: II:  Visual fields grossly normal, pupils equal, round, reactive to light and accommodation III,IV, VI: ptosis not present, extra-ocular motions intact bilaterally V,VII: right NL fold decrease, facial light touch sensation normal bilaterally VIII: hearing normal bilaterally IX,X: uvula rises symmetrically XII: midline tongue extension without atrophy or fasciculations  Motor: Right : Upper extremity   5/5    Left:     Upper extremity   5/5  Lower extremity   4/5     Lower extremity   4/5 Tone and bulk:normal tone throughout; no atrophy noted Sensory: Pinprick and light touch intact throughout, bilaterally Deep Tendon  Reflexes:  Depressed throughout    Lab Results: Basic Metabolic Panel:  Recent Labs Lab 02/11/17 0454 02/12/17 0442 02/13/17 0500 02/14/17 0448 02/15/17 0416 02/15/17 1357 02/16/17 0836  NA 142 142 138 139 140 138 139  K 4.5 3.1* 4.2 4.0 4.0 3.8 4.3  CL 104 100* 99* 100* 99* 97* 99*  CO2 30 32 GLUCOSE 99 103* 92 87 104* 101* 93  BUN 43* 40* 41* 48* 47* 44* 42*  CREATININE 1.00 0.99 0.79 0.82 0.76 0.90 0.78  CALCIUM 9.0 9.0 8.7* 8.6* 9.0 9.0 9.1  MG 1.7 1.6* 1.7 1.7 2.1  --   --   PHOS  --  2.5 2.4* 2.2* 2.3*  --   --     Liver Function Tests:  Recent Labs Lab 02/15/17 1357 02/16/17 0836  AST 34 35  ALT 23 20  ALKPHOS 70 61  BILITOT 1.3* 1.1  PROT 5.8* 5.1*  ALBUMIN 3.3* 2.9*   No results for input(s): LIPASE, AMYLASE in the last 168 hours. No results for input(s): AMMONIA in the last 168 hours.  CBC:  Recent Labs Lab 02/10/17 0122 02/11/17 0454 02/15/17 0416 02/15/17 1357 02/16/17 0356  WBC 9.3 9.4 11.1* 11.6* 9.2  NEUTROABS 7.1 7.1 8.1* 9.0*  --   HGB 16.8* 16.8* 19.1* 18.5* 19.2*  HCT 50.9* 51.7* 57.2* 56.7* 60.0*  MCV 82.2 81.0 82.8 83.0 85.3  PLT 115* 122* 146* 111* 132*    Cardiac Enzymes:  Recent Labs Lab 02/15/17 1357  TROPONINI 0.25*    Lipid Panel:  Recent Labs Lab 02/16/17 0357  CHOL 146  TRIG 85  HDL 46  CHOLHDL 3.2  VLDL 17  LDLCALC 83    CBG: No results for input(s): GLUCAP in the last 168 hours.  Microbiology: Results for orders placed or performed during the hospital encounter of 02/08/17  Culture, blood (routine x 2)     Status: None   Collection Time: 02/08/17 10:34 PM  Result Value Ref Range Status   Specimen Description BLOOD RIGHT WRIST  Final   Special Requests   Final    BOTTLES DRAWN AEROBIC ONLY Blood Culture adequate volume   Culture   Final    NO GROWTH 5 DAYS Performed at [facility], [address] [place], [place] [address]    Report Status 02/14/2017 FINAL  Final   MRSA PCR Screening     Status: None   Collection Time: 02/09/17 12:09 AM  Result Value Ref Range Status   MRSA by PCR NEGATIVE NEGATIVE Final    Comment:        The GeneXpert MRSA Assay (FDA approved for NASAL specimens only), is one component of a comprehensive MRSA colonization surveillance program. It is not intended to diagnose MRSA infection nor to guide or monitor treatment for MRSA infections.   Culture, blood (routine x 2)     Status: None   Collection Time: 02/09/17 12:44 AM  Result Value Ref Range Status   Specimen Description BLOOD LEFT HAND  Final   Special Requests IN PEDIATRIC BOTTLE Blood Culture adequate volume  Final   Culture   Final    NO GROWTH 5 DAYS Performed at [facility], [address], [place], [place] [address]    Report Status 02/14/2017 FINAL  Final    Coagulation Studies:  Recent Labs  02/15/17 1357 02/16/17 1152  LABPROT 15.8* 16.5*  INR 1.27 1.34    Imaging: Ct Angio Head W Or Wo Contrast  Result Date: 02/15/2017 CLINICAL DATA:  83 year old female with left side weakness and leftward gaze deviation. EXAM: CT ANGIOGRAPHY HEAD AND NECK TECHNIQUE: Multidetector CT imaging of the head and neck was performed using the standard protocol during bolus administration of intravenous contrast. Multiplanar CT image reconstructions and MIPs were obtained to evaluate the vascular anatomy. Carotid stenosis measurements (when applicable) are obtained utilizing NASCET criteria, using the distal internal carotid diameter as the denominator. CONTRAST:  100 mL Isovue 370 COMPARISON:  Head CT without contrast 1016 hours today. FINDINGS: CTA NECK Skeleton: Osteopenia. Intermittent cervical spine posterior element ankylosis and facet degeneration. No acute osseous abnormality identified. Upper chest: Layering pleural effusions, moderate to large on the right and small to moderate on the left. Some superimposed patchy nonspecific peripheral right upper lobe  opacity. Probable underlying centrilobular emphysema. No superior mediastinal  lymphadenopathy. Other neck: Negative.  No cervical lymphadenopathy. Aortic arch: The entire aortic arch is not included. Mild visible arch calcified atherosclerosis. Visible proximal great vessels are patent without stenosis despite mild plaque. Right carotid system: Negative visible brachiocephalic artery and right CCA origin. Tortuous right CCA. Negative right carotid bifurcation. Negative cervical right ICA aside from tortuosity. Left carotid system: No proximal left CCA stenosis despite mild plaque. Minimal calcified plaque at the left carotid bifurcation affecting the ECA. Negative cervical left ICA aside from tortuosity. Vertebral arteries:No proximal subclavian artery stenosis with only mild plaque. Both vertebral artery origins are normal, the left is mildly dominant. Both vertebral arteries are patent to the skullbase with no cervical vertebral stenosis. CTA HEAD Posterior circulation: The distal left vertebral artery is dominant and primarily supplies the basilar. Patent left PICA origin. No distal left vertebral stenosis. The non dominant right vertebral artery functionally terminates in the PICA. No basilar artery stenosis. AICA and SCA origins are patent. Fetal type left PCA origin. Right posterior communicating artery is also present. No proximal PCA stenosis. No proximal left PCA stenosis and normal left PCA branches. There is mild to moderate right PCA distal P1 segment stenosis (series 12, image 22). Right PCA branches appear normal. Anterior circulation: Both ICA siphons are patent with only minimal calcified plaque. Ophthalmic and posterior communicating artery origins are normal. Patent carotid termini. Normal MCA and ACA origins. Anterior communicating artery and bilateral ACA branches appear normal. Left MCA M1 segment, left MCA bifurcation, and left MCA branches are within normal limits. Right MCA M1 segment, right  MCA bifurcation, and right MCA branches are within normal limits. Venous sinuses: Patent on the delayed images. Anatomic variants: Dominant left vertebral artery which primarily supplies the basilar. Fetal type left PCA origin. Delayed phase: No abnormal enhancement identified. Stable gray-white matter differentiation throughout the brain. no acute cortically based infarct or intracranial hemorrhage identified. Review of the MIP images confirms the above findings IMPRESSION: 1. Negative for emergent large vessel occlusion, normal for age anterior circulation, and negative for arterial stenosis in the neck. 2. There is a mild to moderate stenosis of the right PCA distal P1 segment. Otherwise the posterior circulation is negative. 3. Stable CT appearance of the brain since 1016 hours today. 4. Bilateral layering pleural effusions, moderate or large on the right. This study was preliminarily discussed by telephone with Dr. Rose Phi on 02/15/2017 at 1135 hours. Electronically Signed   By: Odessa Fleming M.D.   On: 02/15/2017 11:42   Ct Angio Neck W Or Wo Contrast  Result Date: 02/15/2017 CLINICAL DATA:  83 year old female with left side weakness and leftward gaze deviation. EXAM: CT ANGIOGRAPHY HEAD AND NECK TECHNIQUE: Multidetector CT imaging of the head and neck was performed using the standard protocol during bolus administration of intravenous contrast. Multiplanar CT image reconstructions and MIPs were obtained to evaluate the vascular anatomy. Carotid stenosis measurements (when applicable) are obtained utilizing NASCET criteria, using the distal internal carotid diameter as the denominator. CONTRAST:  100 mL Isovue 370 COMPARISON:  Head CT without contrast 1016 hours today. FINDINGS: CTA NECK Skeleton: Osteopenia. Intermittent cervical spine posterior element ankylosis and facet degeneration. No acute osseous abnormality identified. Upper chest: Layering pleural effusions, moderate to large on the right and small  to moderate on the left. Some superimposed patchy nonspecific peripheral right upper lobe opacity. Probable underlying centrilobular emphysema. No superior mediastinal lymphadenopathy. Other neck: Negative.  No cervical lymphadenopathy. Aortic arch: The entire aortic arch is not  included. Mild visible arch calcified atherosclerosis. Visible proximal great vessels are patent without stenosis despite mild plaque. Right carotid system: Negative visible brachiocephalic artery and right CCA origin. Tortuous right CCA. Negative right carotid bifurcation. Negative cervical right ICA aside from tortuosity. Left carotid system: No proximal left CCA stenosis despite mild plaque. Minimal calcified plaque at the left carotid bifurcation affecting the ECA. Negative cervical left ICA aside from tortuosity. Vertebral arteries:No proximal subclavian artery stenosis with only mild plaque. Both vertebral artery origins are normal, the left is mildly dominant. Both vertebral arteries are patent to the skullbase with no cervical vertebral stenosis. CTA HEAD Posterior circulation: The distal left vertebral artery is dominant and primarily supplies the basilar. Patent left PICA origin. No distal left vertebral stenosis. The non dominant right vertebral artery functionally terminates in the PICA. No basilar artery stenosis. AICA and SCA origins are patent. Fetal type left PCA origin. Right posterior communicating artery is also present. No proximal PCA stenosis. No proximal left PCA stenosis and normal left PCA branches. There is mild to moderate right PCA distal P1 segment stenosis (series 12, image 22). Right PCA branches appear normal. Anterior circulation: Both ICA siphons are patent with only minimal calcified plaque. Ophthalmic and posterior communicating artery origins are normal. Patent carotid termini. Normal MCA and ACA origins. Anterior communicating artery and bilateral ACA branches appear normal. Left MCA M1 segment, left MCA  bifurcation, and left MCA branches are within normal limits. Right MCA M1 segment, right MCA bifurcation, and right MCA branches are within normal limits. Venous sinuses: Patent on the delayed images. Anatomic variants: Dominant left vertebral artery which primarily supplies the basilar. Fetal type left PCA origin. Delayed phase: No abnormal enhancement identified. Stable gray-white matter differentiation throughout the brain. no acute cortically based infarct or intracranial hemorrhage identified. Review of the MIP images confirms the above findings IMPRESSION: 1. Negative for emergent large vessel occlusion, normal for age anterior circulation, and negative for arterial stenosis in the neck. 2. There is a mild to moderate stenosis of the right PCA distal P1 segment. Otherwise the posterior circulation is negative. 3. Stable CT appearance of the brain since 1016 hours today. 4. Bilateral layering pleural effusions, moderate or large on the right. This study was preliminarily discussed by telephone with Dr. Rose Phi on 02/15/2017 at 1135 hours. Electronically Signed   By: Odessa Fleming M.D.   On: 02/15/2017 11:42   Mr Brain Wo Contrast  Result Date: 02/15/2017 CLINICAL DATA:  Suspected acute stroke. Leftward gaze deviation. History of cardiac arrhythmia, atrial fibrillation. Aphasia. Generalized weakness. EXAM: MRI HEAD WITHOUT CONTRAST TECHNIQUE: Multiplanar, multiecho pulse sequences of the brain and surrounding structures were obtained without intravenous contrast. COMPARISON:  Noncontrast CT head along with CTA head neck earlier today. FINDINGS: The patient was unable to remain motionless for the exam. Small or subtle lesions could be overlooked. Brain: Multifocal areas of restricted diffusion seen throughout both cerebral hemispheres and the RIGHT cerebellar hemisphere consistent with a shower of emboli. The largest number of lesions can be seen in the LEFT hemisphere, involving the frontal, temporal, parietal,  occipital, and insular regions as well as the basal ganglia. Most of these are punctate/focal in nature although confluent restricted diffusion can be seen in the posterior LEFT parietal cortex. Smaller punctate lesions involve the RIGHT frontal cortex, centrum semiovale on the RIGHT, as well as the RIGHT cerebellar cortex and deep white matter. Within limits for detection on MR, no acute hemorrhage. There is advanced atrophy.  Mild to moderate T2 and FLAIR hyperintensity throughout the deep white matter is noted, likely small vessel disease. Vascular: Flow voids are maintained. Skull and upper cervical spine: Normal marrow signal. Sinuses/Orbits: Chronic changes in the RIGHT maxillary sinus, prior surgery. No layering fluid. Negative orbits. Other: Trace mastoid effusion on the LEFT. IMPRESSION: Widespread multifocal areas of acute infarction, nonhemorrhagic, throughout both cerebral hemispheres, as well as the RIGHT cerebellar hemisphere, most consistent with a shower of emboli. The dominant hemispheric involvement is within the LEFT ICA/MCA territory. Electronically Signed   By: Elsie Stain M.D.   On: 02/15/2017 12:37   Dg Chest Port 1 View  Result Date: 02/16/2017 CLINICAL DATA:  Follow-up pleural effusion. EXAM: PORTABLE CHEST 1 VIEW COMPARISON:  02/08/2017 FINDINGS: Patient is moderately rotated to the left. Lungs are adequately inflated and demonstrate minimal hazy bibasilar density likely small effusions without significant change. Interval improvement in mild opacification of the right perihilar region. Cardiomediastinal silhouette and remainder of the exam is unchanged. IMPRESSION: Improving right perihilar airspace process. Subtle hazy bibasilar density likely small effusions without significant change. Electronically Signed   By: Elberta Fortis M.D.   On: 02/16/2017 08:40   Ct Head Code Stroke Wo Contrast  Result Date: 02/15/2017 CLINICAL DATA:  Code stroke. LEFT-sided weakness. Head leaning to  LEFT. Evaluate for stroke. Aphasia. LEFT-sided gaze preference. EXAM: CT HEAD WITHOUT CONTRAST TECHNIQUE: Contiguous axial images were obtained from the base of the skull through the vertex without intravenous contrast. COMPARISON:  02/11/2016. FINDINGS: Brain: No evidence for acute infarction, hemorrhage, mass lesion, hydrocephalus, or extra-axial fluid. Moderately advanced atrophy. Hypoattenuation of white matter suggesting small vessel disease. Chronic RIGHT cerebellar infarct. Tiny chronic LEFT caudate lacunar infarct. Vascular: Calcification of the internal carotid arteries and distal LEFT vertebral artery, consistent with cerebrovascular atherosclerotic disease. No signs of intracranial large vessel occlusion. Skull: Normal. Negative for fracture or focal lesion. Sinuses/Orbits: No acute finding. Other: Compared with priors, similar appearance. ASPECTS [facility] Stroke Program Early CT Score) - Ganglionic level infarction (caudate, lentiform nuclei, internal capsule, insula, M1-M3 cortex): 7 - Supraganglionic infarction (M4-M6 cortex): 3 Total score (0-10 with 10 being normal): 10 IMPRESSION: 1. Atrophy and small vessel disease. No acute cortical infarction or large vessel occlusion is evident. 2. ASPECTS is 10. These results were called by telephone at the time of interpretation on 02/15/2017 at 10:35 am to Dr. Rose Phi , who verbally acknowledged these results. Electronically Signed   By: Elsie Stain M.D.   On: 02/15/2017 10:38    Medications:  Prior to Admission:  Prescriptions Prior to Admission  Medication Sig Dispense Refill Last Dose  . acetaminophen (TYLENOL) 500 MG tablet Take 1 tablet (500 mg total) by mouth every 6 (six) hours as needed for mild pain. (Patient taking differently: Take 1,000 mg by mouth every 6 (six) hours as needed for mild pain, moderate pain, fever or headache. ) 30 tablet 0 02/08/2017 at Unknown time  . Chlorpheniramine-APAP (CORICIDIN) 2-325 MG TABS Take 1-2 tablets by  mouth at bedtime as needed (for cough).   Past Month at Unknown time  . cimetidine (TAGAMET) 200 MG tablet Take 200 mg by mouth daily as needed (for heartburn).   Past Week at Unknown time  . losartan (COZAAR) 100 MG tablet Take 100 mg by mouth daily.   Past Week at Unknown time  . metoprolol tartrate (LOPRESSOR) 25 MG tablet Take 25 mg by mouth 2 (two) times daily.   Past Week at unknown time  Scheduled: .  stroke: mapping our early stages of recovery book   Does not apply Once  . atorvastatin  40 mg Oral q1800  . Chlorhexidine Gluconate Cloth  6 each Topical Daily  . feeding supplement  1 Container Oral BID BM  . feeding supplement (ENSURE ENLIVE)  237 mL Oral Q24H  . mouth rinse  15 mL Mouth Rinse BID  . multivitamin with minerals  1 tablet Oral Daily  . sodium chloride flush  10-40 mL Intracatheter Q12H    Assessment/Plan:  This is an 83 year old female presenting to the [facility] emergency room secondary to generalized weakness and malaise. Patient at that time was a phasic flow left gaze preference. Code stroke was called. She had a noncontrast CT which did not show any acute strokes. Patient was on [place] Pian was and did show a left ventricular thrombus. Patient was out of the window for TPA. MRI showed scattered cardioembolic infarcts. Patient silhouette this was DC'd and patient was turned on heparin drip. Today patient appears to be more alert and slightly more talkative with a right-sided weakness. Patient was to be transferred to [facility] yesterday however she was not secondary to no beds available. Patient will be transferred today at some point in time and then followed by the stroke team tomorrow.      Felicie Morn PA-C [facility] [phone]  02/16/2017, 2:49 PM    Attending addendum Agree with the above. Please see full consultation from yesterday for detailed plan. Patient will be transferred to [facility] and will be followed by the stroke team  in the morning. Please call with questions  Milon Dikes, MD [facility] [phone]  If 7pm to 7am, please call on call as listed on AMION.

## 2017-02-16 NOTE — Progress Notes (Signed)
Spoke with primary RN about PICC placement ,unable to contact family for consent so RN to contact MD .

## 2017-02-16 NOTE — Progress Notes (Addendum)
Lab unable to obtain enough of a blood sample for morning labwork (CMP);  r/t Pts poor vasculature. MD on call has placed order for PICC line to be placed.

## 2017-02-16 NOTE — Evaluation (Addendum)
Physical Therapy RE- Evaluation Patient Details Name: Rachel [name] MRN: [id number] DOB: 1931-05-25 Today's Date: 02/16/2017   History of Present Illness  83 yo female admitted with A fib, Pna, CHF. Hx of L hip hemi-posterior 2017, OA, arrythmia, HTN.  Code Stroke was called during admission.  Pt found to have widespread acute infarcts  Clinical Impression  The patient presents with expressive difficulties. Assisted to sitting on the bedside, able to sit without support, noted to be  Moving both arms and legs today. No family present, just a neighbor.     Follow Up Recommendations  snf    Equipment Recommendations    none   Recommendations for Other Services       Precautions / Restrictions Precautions Precautions: Fall Restrictions Weight Bearing Restrictions: No      Mobility  Bed Mobility Overal bed mobility: Needs Assistance Bed Mobility: Supine to Sit;Sit to Supine     Supine to sit: Total assist;+2 for physical assistance;+2 for safety/equipment Sit to supine: +2 for safety/equipment;+2 for physical assistance;Total assist   General bed mobility comments: assisted to sitting and back to supine.  Transfers                 General transfer comment: did not attempt today  Ambulation/Gait                Stairs            Wheelchair Mobility    Modified Rankin (Stroke Patients Only)       Balance Overall balance assessment: Needs assistance;History of Falls Sitting-balance support: Feet unsupported Sitting balance-Leahy Scale: Fair Sitting balance - Comments: pt sat unsupported for approximately 5 minutes; feet unsupported due to height of bed.  min guard for safety                                     Pertinent Vitals/Pain Pain Assessment: Faces Faces Pain Scale: No hurt    Home Living Family/patient expects to be discharged to:: Skilled nursing facility Living Arrangements: Alone Available Help at Discharge:  Personal care attendant Type of Home: House         Home Equipment: Dan Humphreys - 2 wheels;Cane - single point      Prior Function Level of Independence: Needs assistance   Gait / Transfers Assistance Needed: ambulatory with a RW-household distances per pt  ADL's / Homemaking Assistance Needed: aide assists with household tasks        Hand Dominance   Dominant Hand: Right    Extremity/Trunk Assessment   Upper Extremity Assessment Upper Extremity Assessment: Defer to OT evaluation    Lower Extremity Assessment Lower Extremity Assessment: RLE deficits/detail;LLE deficits/detail RLE Deficits / Details: patient did extend knee and mild move ankles, not full range LLE Deficits / Details: similar to right       Communication   Communication: Expressive difficulties (some words automatic; could not express self and knew this)  Cognition Arousal/Alertness: Awake/alert Behavior During Therapy: WFL for tasks assessed/performed Overall Cognitive Status: Difficult to assess                                 General Comments: pt is following most commands:  moving limbs, washing face      General Comments General comments (skin integrity, edema, etc.): supine BP 130/62; sitting BP 152/70  Exercises     Assessment/Plan    PT Assessment  needs continued PT  PT Problem List   decreased strength, decreased mobility       PT Treatment Interventions   mobility, transfers, NM reeducation   PT Goals (Current goals can be found in the Care Plan section)  Acute Rehab PT Goals Patient Stated Goal: none stated PT Goal Formulation: Patient unable to participate in goal setting Time For Goal Achievement: 03/02/17 Potential to Achieve Goals: Fair    Frequency  2 x wk   Barriers to discharge        Co-evaluation PT/OT/SLP Co-Evaluation/Treatment: Yes Reason for Co-Treatment: For patient/therapist safety PT goals addressed during session: Mobility/safety with  mobility OT goals addressed during session: ADL's and self-care       AM-PAC PT "6 Clicks" Daily Activity  Outcome Measure                  End of Session              Time: 4707-6151 PT Time Calculation (min) (ACUTE ONLY): 17 min   Charges:   PT Evaluation $PT Re-evaluation: 1 Re-eval     PT G CodesBlanchard Kelch PT [phone] }  Rada Hay 02/16/2017, 1:10 PM

## 2017-02-16 NOTE — Progress Notes (Signed)
Peripherally Inserted Central Catheter/Midline Placement  The IV Nurse has discussed with the patient and/or persons authorized to consent for the patient, the purpose of this procedure and the potential benefits and risks involved with this procedure.  The benefits include less needle sticks, lab draws from the catheter, and the patient may be discharged home with the catheter. Risks include, but not limited to, infection, bleeding, blood clot (thrombus formation), and puncture of an artery; nerve damage and irregular heartbeat and possibility to perform a PICC exchange if needed/ordered by physician.  Alternatives to this procedure were also discussed.  Bard Power PICC patient education guide, fact sheet on infection prevention and patient information card has been provided to patient /or left at bedside.    PICC/Midline Placement Documentation    Consent obtained by primary RN via telephone with Niece     Rachel [name] 02/16/2017, 2:25 PM

## 2017-02-16 NOTE — Progress Notes (Signed)
Rapid Response Event Note  Overview:  RR called to response to pt potentially having a stroke. Upon arrival to unit, Dr. Lowell Guitar was at the bedside performing a neuro assessment and determined pt had L gaze preference and new onset aphasia. RR RN transported pt to CT. Head CT performed. Pt transported back to room 1438 after completion of CT. Upon arrival to 1438, neurologist was at bedside. Neurologist ordered a CTA and MRI. RR RN placed a 20 gauge IV in pt RUA for CTA. RR transported pt to CT with neurologist remaining at bedside. Pt then transported to MRI by RR RN. BP 100/51 (62), pt appeared to be in A-fib with a rate of 95 bpm. Upon completion of MRI, pt was transported by ICU RN to SD unit. Results of MRI were called to neurologist. Dr. Lowell Guitar ordered pt to be transferred to SD unit at [facility]. Bed request placed. Pt currently being monitored at [facility] SD room 1224.     Initial Focused Assessment: Per MD, Dr. Lowell Guitar, pt was believed to be having stroke like symptoms. Code stroke called. RR transported pt to CT and MRI.   Interventions: MD at bedside performing NIH stroke scale upon arrival of RR RN. RR RN aided in transporting pt to and from radiology tests, and also placed PIV. VS monitored. Pt monitored for change in mentation. Pt transferred to [facility] SD unit, awaiting transfer to [facility].    Rachel [name]

## 2017-02-16 NOTE — Progress Notes (Addendum)
PROGRESS NOTE    Rachel [name]  EAV:[id number] DOB: 08/31/1930 DOA: 02/08/2017 PCP: Renford Dills, MD    Brief Narrative:  Tanazia Achee Allenis a 83 y.o.femalewith medical history significant for hypertension, osteoarthritis, and history of cardiac arrhythmia, now presenting to the emergency department for evaluation of generalized weakness and malaise. Patient was accompanied by her caretaker reporting that the patient has been experiencing increasing generalized weakness over the past couple days, much worse today. Patient was seated on the toilet, but was too weak to get up, prompting the caretaker to call EMS for evaluation. Patient required significant assistance just to stand and was brought into the ED for evaluation. In the ED, she was found to be in A Fib with tachycardia, started on cardizem gtt. She was also found to be fluid overloaded with BNP 3637 and was given IV lasix. She underwent echocardiogram which showed reduced systolic function EF 25% along with severe hypokinesis of anteroseptal and anterior myocardium. Also found a small thrombus in the left atrium. Cardiology was consulted.   Code stroke called 9/19 due to new aphasia, L gaze preference.  Neurology c/s.  Stat head CT pending.  First noticed abnormal around 130 PM 9/18 by one of her caregivers.    Now on heparin gtt for stroke.  Plan to transfer to [facility] for [facility]y.  On dilt gtt for afib with rvr.    Assessment & Plan:   Principal Problem:   New onset atrial fibrillation (HCC) Active Problems:   HTN (hypertension)   CAP (community acquired pneumonia)   Renal insufficiency   NSTEMI (non-ST elevated myocardial infarction) (HCC)   Acute CHF (congestive heart failure) (HCC)   General weakness   Malnutrition of moderate degree   Stroke [facility])   Stroke:  First noted abnormal yesterday by one of caregivers ([name] told me she was told around 130 PM on 9/18 things were noted to be abnormal).  Aphasia on exam, L  gaze preference.  Difficult to test strength due to inconsistent following commands.   Code stroke on AM of 9/19. Likely due to thrombus in LA.  Neurology following, appreciate Dr. Wilford Corner assistance MRI notable for widespread multifocal areas of acute infarction (cerebral/cerebellar hemispheres) c/w shower of emboli.  Head CT, CTA head/neck also performed and notable for mild to mod stenosis of R PCA distal P1 segment. Stroke labs Due to heparin gtt with stroke, hydral ordered for BP >160/105 PT/OT/Speech Not TPA candidate due to unknown last normal and on eliquis  New onset A fib with RVR  -CHADS-VASc is at least 4 -Started eliquis - now on heparin -currently on dilt gtt  Elevated WBC and Hb  Mild thrombocytopenia:  - repeat CBC with stroke labs above - continue to monitor, consider discussing elevated Hb with heme, though etiology of stroke likely due to LA thrombus given findings.  Acute systolic and diastolic CHF -BNP 3637 on admission -Cardiology consulted  -currently holding lasix, losartan.  On dilt gtt as above.  -Strict I/O, daily weight -Continues to be very edematous. - watch volume status with holding lasix  Elevated troponin -Could be due to demand ischemia in setting of infection, A Fib, CHF -0.25 --> 0.32 --> 0.25 -Not a candidate for heart cath  - repeat troponin stable at 0.25, EKG new q in V6? But otherwise stable.    Pleural effusion: - seen on CT above, will get CXR today  Right upper lobar pneumonia -Blood cultures negative to date  -Deescalate antibiotics to ceftin.  Completed therapy.    AKI on CKD stage 3  -Baseline Cr 0.6  -Trend BMP, stable currently   Concern for refeeding syndrome -Continue to monitor K, Mg, Phos   PVD -Left ABIs and Doppler waveforms indicate a severe reduction in arterial flow at rest. Not sure that patient is a good candidate for any further procedure. Continue eliquis.   Urinary Retention: foley placed after  stroke as pt with high post voids, retaining urine  Access: getting PICC due to difficult access  DVT prophylaxis: heparin gtt per pharm Code Status: DNR Family Communication: called Harrie Foreman (listed emergency contact) Disposition Plan: pending  Consultants:   Neurology  cardiology  Procedures: (Don't include imaging studies which can be auto populated. Include things that cannot be auto populated i.e. Echo, Carotid and venous dopplers, Foley, Bipap, HD, tubes/drains, wound vac, central lines etc)  Echo - EF 25-30%, L atrium with fixed mass c/w thrombus (tumor cannot be excluded), PASP increased to 60 (see report for additional details)  R ABI with normal arterial flow at rest, L ABI with severe reduction in arterial flow at rest  Antimicrobials: (specify start and planned stop date. Auto populated tables are space occupying and do not give end dates) Anti-infectives    Start     Dose/Rate Route Frequency Ordered Stop   02/11/17 1700  cefUROXime (CEFTIN) tablet 500 mg     500 mg Oral 2 times daily with meals 02/11/17 1139 02/13/17 1916   02/10/17 2200  azithromycin (ZITHROMAX) tablet 500 mg  Status:  Discontinued     500 mg Oral Daily at bedtime 02/10/17 0826 02/11/17 1139   02/09/17 2200  cefTRIAXone (ROCEPHIN) 1 g in dextrose 5 % 50 mL IVPB  Status:  Discontinued     1 g 100 mL/hr over 30 Minutes Intravenous Daily at bedtime 02/08/17 2243 02/11/17 1139   02/09/17 2200  azithromycin (ZITHROMAX) 500 mg in dextrose 5 % 250 mL IVPB  Status:  Discontinued     500 mg 250 mL/hr over 60 Minutes Intravenous Every 24 hours 02/08/17 2243 02/10/17 0826   02/08/17 2230  cefTRIAXone (ROCEPHIN) 1 g in dextrose 5 % 50 mL IVPB     1 g 100 mL/hr over 30 Minutes Intravenous  Once 02/08/17 2227 02/09/17 0123   02/08/17 2230  azithromycin (ZITHROMAX) 500 mg in dextrose 5 % 250 mL IVPB     500 mg 250 mL/hr over 60 Minutes Intravenous  Once 02/08/17 2227 02/09/17 0307         Subjective: Uintelligible speech.   Objective: Vitals:   02/16/17 0600 02/16/17 0615 02/16/17 0616 02/16/17 0700  BP: (!) 119/40     Pulse: 68 (!) 59 61   Resp: (!) 22 (!) 26 (!) 27   Temp:    (!) 97 F (36.1 C)  TempSrc:    Axillary  SpO2: 100% 100% 100%   Weight:      Height:        Intake/Output Summary (Last 24 hours) at 02/16/17 0733 Last data filed at 02/16/17 0400  Gross per 24 hour  Intake               60 ml  Output             2140 ml  Net            -2080 ml   Filed Weights   02/15/17 0640 02/15/17 1300 02/16/17 0434  Weight: 51.4 kg (113 lb 5.1 oz) 52  kg (114 lb 10.2 oz) 50.9 kg (112 lb 3.4 oz)    Examination:  General: No acute distress. Cardiovascular: Heart sounds show a regular rate, and rhythm. No gallops or rubs. No murmurs. No JVD. Lungs: Clear to auscultation bilaterally with good air movement. No rales, rhonchi or wheezes. Abdomen: Soft, nontender, nondistended with normal active bowel sounds. No masses. No hepatosplenomegaly. Neurological: Word salad.  Moves all extremities, but inconsistent following of commands.  L gaze preference. Skin: Warm and dry. No rashes or lesions. Extremities: No clubbing or cyanosis. 1+ LEE. Pedal pulses 2+.   Data Reviewed: I have personally reviewed following labs and imaging studies  CBC:  Recent Labs Lab 02/10/17 0122 02/11/17 0454 02/15/17 0416 02/15/17 1357 02/16/17 0356  WBC 9.3 9.4 11.1* 11.6* 9.2  NEUTROABS 7.1 7.1 8.1* 9.0*  --   HGB 16.8* 16.8* 19.1* 18.5* 19.2*  HCT 50.9* 51.7* 57.2* 56.7* 60.0*  MCV 82.2 81.0 82.8 83.0 85.3  PLT 115* 122* 146* 111* 132*   Basic Metabolic Panel:  Recent Labs Lab 02/11/17 0454 02/12/17 0442 02/13/17 0500 02/14/17 0448 02/15/17 0416 02/15/17 1357  NA 142 142 138 139 140 138  K 4.5 3.1* 4.2 4.0 4.0 3.8  CL 104 100* 99* 100* 99* 97*  CO2 30 32 GLUCOSE 99 103* 92 87 104* 101*  BUN 43* 40* 41* 48* 47* 44*  CREATININE 1.00 0.99 0.79  0.82 0.76 0.90  CALCIUM 9.0 9.0 8.7* 8.6* 9.0 9.0  MG 1.7 1.6* 1.7 1.7 2.1  --   PHOS  --  2.5 2.4* 2.2* 2.3*  --    GFR: Estimated Creatinine Clearance: 33.4 mL/min (by C-G formula based on SCr of 0.9 mg/dL). Liver Function Tests:  Recent Labs Lab 02/15/17 1357  AST 34  ALT 23  ALKPHOS 70  BILITOT 1.3*  PROT 5.8*  ALBUMIN 3.3*   No results for input(s): LIPASE, AMYLASE in the last 168 hours. No results for input(s): AMMONIA in the last 168 hours. Coagulation Profile:  Recent Labs Lab 02/15/17 1357  INR 1.27   Cardiac Enzymes:  Recent Labs Lab 02/09/17 0822 02/09/17 1348 02/15/17 1357  TROPONINI 0.25* 0.26* 0.25*   BNP (last 3 results) No results for input(s): PROBNP in the last 8760 hours. HbA1C: No results for input(s): HGBA1C in the last 72 hours. CBG: No results for input(s): GLUCAP in the last 168 hours. Lipid Profile: No results for input(s): CHOL, HDL, LDLCALC, TRIG, CHOLHDL, LDLDIRECT in the last 72 hours. Thyroid Function Tests: No results for input(s): TSH, T4TOTAL, FREET4, T3FREE, THYROIDAB in the last 72 hours. Anemia Panel: No results for input(s): VITAMINB12, FOLATE, FERRITIN, TIBC, IRON, RETICCTPCT in the last 72 hours. Sepsis Labs: No results for input(s): PROCALCITON, LATICACIDVEN in the last 168 hours.  Recent Results (from the past 240 hour(s))  Culture, blood (routine x 2)     Status: None   Collection Time: 02/08/17 10:34 PM  Result Value Ref Range Status   Specimen Description BLOOD RIGHT WRIST  Final   Special Requests   Final    BOTTLES DRAWN AEROBIC ONLY Blood Culture adequate volume   Culture   Final    NO GROWTH 5 DAYS Performed at [facility], [address], [place], [place] [address]    Report Status 02/14/2017 FINAL  Final  MRSA PCR Screening     Status: None   Collection Time: 02/09/17 12:09 AM  Result Value Ref Range Status   MRSA by  PCR NEGATIVE NEGATIVE Final    Comment:        The GeneXpert MRSA Assay  (FDA approved for NASAL specimens only), is one component of a comprehensive MRSA colonization surveillance program. It is not intended to diagnose MRSA infection nor to guide or monitor treatment for MRSA infections.   Culture, blood (routine x 2)     Status: None   Collection Time: 02/09/17 12:44 AM  Result Value Ref Range Status   Specimen Description BLOOD LEFT HAND  Final   Special Requests IN PEDIATRIC BOTTLE Blood Culture adequate volume  Final   Culture   Final    NO GROWTH 5 DAYS Performed at [facility], [address] [place], [place] [address]    Report Status 02/14/2017 FINAL  Final         Radiology Studies: Ct Angio Head W Or Wo Contrast  Result Date: 02/15/2017 CLINICAL DATA:  83 year old female with left side weakness and leftward gaze deviation. EXAM: CT ANGIOGRAPHY HEAD AND NECK TECHNIQUE: Multidetector CT imaging of the head and neck was performed using the standard protocol during bolus administration of intravenous contrast. Multiplanar CT image reconstructions and MIPs were obtained to evaluate the vascular anatomy. Carotid stenosis measurements (when applicable) are obtained utilizing NASCET criteria, using the distal internal carotid diameter as the denominator. CONTRAST:  100 mL Isovue 370 COMPARISON:  Head CT without contrast 1016 hours today. FINDINGS: CTA NECK Skeleton: Osteopenia. Intermittent cervical spine posterior element ankylosis and facet degeneration. No acute osseous abnormality identified. Upper chest: Layering pleural effusions, moderate to large on the right and small to moderate on the left. Some superimposed patchy nonspecific peripheral right upper lobe opacity. Probable underlying centrilobular emphysema. No superior mediastinal lymphadenopathy. Other neck: Negative.  No cervical lymphadenopathy. Aortic arch: The entire aortic arch is not included. Mild visible arch calcified atherosclerosis. Visible proximal great vessels are patent  without stenosis despite mild plaque. Right carotid system: Negative visible brachiocephalic artery and right CCA origin. Tortuous right CCA. Negative right carotid bifurcation. Negative cervical right ICA aside from tortuosity. Left carotid system: No proximal left CCA stenosis despite mild plaque. Minimal calcified plaque at the left carotid bifurcation affecting the ECA. Negative cervical left ICA aside from tortuosity. Vertebral arteries:No proximal subclavian artery stenosis with only mild plaque. Both vertebral artery origins are normal, the left is mildly dominant. Both vertebral arteries are patent to the skullbase with no cervical vertebral stenosis. CTA HEAD Posterior circulation: The distal left vertebral artery is dominant and primarily supplies the basilar. Patent left PICA origin. No distal left vertebral stenosis. The non dominant right vertebral artery functionally terminates in the PICA. No basilar artery stenosis. AICA and SCA origins are patent. Fetal type left PCA origin. Right posterior communicating artery is also present. No proximal PCA stenosis. No proximal left PCA stenosis and normal left PCA branches. There is mild to moderate right PCA distal P1 segment stenosis (series 12, image 22). Right PCA branches appear normal. Anterior circulation: Both ICA siphons are patent with only minimal calcified plaque. Ophthalmic and posterior communicating artery origins are normal. Patent carotid termini. Normal MCA and ACA origins. Anterior communicating artery and bilateral ACA branches appear normal. Left MCA M1 segment, left MCA bifurcation, and left MCA branches are within normal limits. Right MCA M1 segment, right MCA bifurcation, and right MCA branches are within normal limits. Venous sinuses: Patent on the delayed images. Anatomic variants: Dominant left vertebral artery which primarily supplies the basilar. Fetal type left PCA  origin. Delayed phase: No abnormal enhancement identified. Stable  gray-white matter differentiation throughout the brain. no acute cortically based infarct or intracranial hemorrhage identified. Review of the MIP images confirms the above findings IMPRESSION: 1. Negative for emergent large vessel occlusion, normal for age anterior circulation, and negative for arterial stenosis in the neck. 2. There is a mild to moderate stenosis of the right PCA distal P1 segment. Otherwise the posterior circulation is negative. 3. Stable CT appearance of the brain since 1016 hours today. 4. Bilateral layering pleural effusions, moderate or large on the right. This study was preliminarily discussed by telephone with Dr. Rose Phi on 02/15/2017 at 1135 hours. Electronically Signed   By: Odessa Fleming M.D.   On: 02/15/2017 11:42   Ct Angio Neck W Or Wo Contrast  Result Date: 02/15/2017 CLINICAL DATA:  83 year old female with left side weakness and leftward gaze deviation. EXAM: CT ANGIOGRAPHY HEAD AND NECK TECHNIQUE: Multidetector CT imaging of the head and neck was performed using the standard protocol during bolus administration of intravenous contrast. Multiplanar CT image reconstructions and MIPs were obtained to evaluate the vascular anatomy. Carotid stenosis measurements (when applicable) are obtained utilizing NASCET criteria, using the distal internal carotid diameter as the denominator. CONTRAST:  100 mL Isovue 370 COMPARISON:  Head CT without contrast 1016 hours today. FINDINGS: CTA NECK Skeleton: Osteopenia. Intermittent cervical spine posterior element ankylosis and facet degeneration. No acute osseous abnormality identified. Upper chest: Layering pleural effusions, moderate to large on the right and small to moderate on the left. Some superimposed patchy nonspecific peripheral right upper lobe opacity. Probable underlying centrilobular emphysema. No superior mediastinal lymphadenopathy. Other neck: Negative.  No cervical lymphadenopathy. Aortic arch: The entire aortic arch is not  included. Mild visible arch calcified atherosclerosis. Visible proximal great vessels are patent without stenosis despite mild plaque. Right carotid system: Negative visible brachiocephalic artery and right CCA origin. Tortuous right CCA. Negative right carotid bifurcation. Negative cervical right ICA aside from tortuosity. Left carotid system: No proximal left CCA stenosis despite mild plaque. Minimal calcified plaque at the left carotid bifurcation affecting the ECA. Negative cervical left ICA aside from tortuosity. Vertebral arteries:No proximal subclavian artery stenosis with only mild plaque. Both vertebral artery origins are normal, the left is mildly dominant. Both vertebral arteries are patent to the skullbase with no cervical vertebral stenosis. CTA HEAD Posterior circulation: The distal left vertebral artery is dominant and primarily supplies the basilar. Patent left PICA origin. No distal left vertebral stenosis. The non dominant right vertebral artery functionally terminates in the PICA. No basilar artery stenosis. AICA and SCA origins are patent. Fetal type left PCA origin. Right posterior communicating artery is also present. No proximal PCA stenosis. No proximal left PCA stenosis and normal left PCA branches. There is mild to moderate right PCA distal P1 segment stenosis (series 12, image 22). Right PCA branches appear normal. Anterior circulation: Both ICA siphons are patent with only minimal calcified plaque. Ophthalmic and posterior communicating artery origins are normal. Patent carotid termini. Normal MCA and ACA origins. Anterior communicating artery and bilateral ACA branches appear normal. Left MCA M1 segment, left MCA bifurcation, and left MCA branches are within normal limits. Right MCA M1 segment, right MCA bifurcation, and right MCA branches are within normal limits. Venous sinuses: Patent on the delayed images. Anatomic variants: Dominant left vertebral artery which primarily supplies the  basilar. Fetal type left PCA origin. Delayed phase: No abnormal enhancement identified. Stable gray-white matter differentiation throughout the brain. no  acute cortically based infarct or intracranial hemorrhage identified. Review of the MIP images confirms the above findings IMPRESSION: 1. Negative for emergent large vessel occlusion, normal for age anterior circulation, and negative for arterial stenosis in the neck. 2. There is a mild to moderate stenosis of the right PCA distal P1 segment. Otherwise the posterior circulation is negative. 3. Stable CT appearance of the brain since 1016 hours today. 4. Bilateral layering pleural effusions, moderate or large on the right. This study was preliminarily discussed by telephone with Dr. Rose Phi on 02/15/2017 at 1135 hours. Electronically Signed   By: Odessa Fleming M.D.   On: 02/15/2017 11:42   Mr Brain Wo Contrast  Result Date: 02/15/2017 CLINICAL DATA:  Suspected acute stroke. Leftward gaze deviation. History of cardiac arrhythmia, atrial fibrillation. Aphasia. Generalized weakness. EXAM: MRI HEAD WITHOUT CONTRAST TECHNIQUE: Multiplanar, multiecho pulse sequences of the brain and surrounding structures were obtained without intravenous contrast. COMPARISON:  Noncontrast CT head along with CTA head neck earlier today. FINDINGS: The patient was unable to remain motionless for the exam. Small or subtle lesions could be overlooked. Brain: Multifocal areas of restricted diffusion seen throughout both cerebral hemispheres and the RIGHT cerebellar hemisphere consistent with a shower of emboli. The largest number of lesions can be seen in the LEFT hemisphere, involving the frontal, temporal, parietal, occipital, and insular regions as well as the basal ganglia. Most of these are punctate/focal in nature although confluent restricted diffusion can be seen in the posterior LEFT parietal cortex. Smaller punctate lesions involve the RIGHT frontal cortex, centrum semiovale on the  RIGHT, as well as the RIGHT cerebellar cortex and deep white matter. Within limits for detection on MR, no acute hemorrhage. There is advanced atrophy. Mild to moderate T2 and FLAIR hyperintensity throughout the deep white matter is noted, likely small vessel disease. Vascular: Flow voids are maintained. Skull and upper cervical spine: Normal marrow signal. Sinuses/Orbits: Chronic changes in the RIGHT maxillary sinus, prior surgery. No layering fluid. Negative orbits. Other: Trace mastoid effusion on the LEFT. IMPRESSION: Widespread multifocal areas of acute infarction, nonhemorrhagic, throughout both cerebral hemispheres, as well as the RIGHT cerebellar hemisphere, most consistent with a shower of emboli. The dominant hemispheric involvement is within the LEFT ICA/MCA territory. Electronically Signed   By: Elsie Stain M.D.   On: 02/15/2017 12:37   Ct Head Code Stroke Wo Contrast  Result Date: 02/15/2017 CLINICAL DATA:  Code stroke. LEFT-sided weakness. Head leaning to LEFT. Evaluate for stroke. Aphasia. LEFT-sided gaze preference. EXAM: CT HEAD WITHOUT CONTRAST TECHNIQUE: Contiguous axial images were obtained from the base of the skull through the vertex without intravenous contrast. COMPARISON:  02/11/2016. FINDINGS: Brain: No evidence for acute infarction, hemorrhage, mass lesion, hydrocephalus, or extra-axial fluid. Moderately advanced atrophy. Hypoattenuation of white matter suggesting small vessel disease. Chronic RIGHT cerebellar infarct. Tiny chronic LEFT caudate lacunar infarct. Vascular: Calcification of the internal carotid arteries and distal LEFT vertebral artery, consistent with cerebrovascular atherosclerotic disease. No signs of intracranial large vessel occlusion. Skull: Normal. Negative for fracture or focal lesion. Sinuses/Orbits: No acute finding. Other: Compared with priors, similar appearance. ASPECTS [facility] Stroke Program Early CT Score) - Ganglionic level infarction (caudate,  lentiform nuclei, internal capsule, insula, M1-M3 cortex): 7 - Supraganglionic infarction (M4-M6 cortex): 3 Total score (0-10 with 10 being normal): 10 IMPRESSION: 1. Atrophy and small vessel disease. No acute cortical infarction or large vessel occlusion is evident. 2. ASPECTS is 10. These results were called by telephone at the time of  interpretation on 02/15/2017 at 10:35 am to Dr. Rose Phi , who verbally acknowledged these results. Electronically Signed   By: Elsie Stain M.D.   On: 02/15/2017 10:38        Scheduled Meds: .  stroke: mapping our early stages of recovery book   Does not apply Once  . atorvastatin  40 mg Oral q1800  . feeding supplement  1 Container Oral BID BM  . feeding supplement (ENSURE ENLIVE)  237 mL Oral Q24H  . mouth rinse  15 mL Mouth Rinse BID  . multivitamin with minerals  1 tablet Oral Daily   Continuous Infusions: . diltiazem (CARDIZEM) infusion 5 mg/hr (02/16/17 0616)  . heparin 700 Units/hr (02/16/17 0400)     LOS: 8 days    Time spent: over 30 minutes    Lacretia Nicks, MD [facility] [phone]   If 7PM-7AM, please contact night-coverage [email or web address] Password [facility] 02/16/2017, 7:33 AM

## 2017-02-16 NOTE — Care Management Note (Signed)
Case Management Note  Patient Details  Name: Rachel [name] MRN: [id number] Date of Birth: 1931/05/26  Subjective/Objective:                  cva  Action/Plan: Date:  February 16, 2017 Chart reviewed for concurrent status and case management needs. Will continue to follow patient progress. Discharge Planning: following for needs Expected discharge date: [id number] Marcelle Smiling, BSN, [place], [place]   [phone]  Expected Discharge Date:   (unknown)               Expected Discharge Plan:  Home/Self Care  In-House Referral:     Discharge planning Services  CM Consult  Post Acute Care Choice:    Choice offered to:     DME Arranged:    DME Agency:     HH Arranged:    HH Agency:     Status of Service:  In process, will continue to follow  If discussed at Long Length of Stay Meetings, dates discussed:    Additional Comments:  Golda Acre, RN 02/16/2017, 9:16 AM

## 2017-02-16 NOTE — Progress Notes (Signed)
ANTICOAGULATION CONSULT NOTE - Initial Consult  Pharmacy Consult for Heparin Indication: atrial fibrillation, CVA  Allergies  Allergen Reactions  . Aspirin     nosebleed    Patient Measurements: Height: 5' (152.4 cm) Weight: 114 lb 10.2 oz (52 kg) IBW/kg (Calculated) : 45.5 Heparin Dosing Weight: actual weight  Vital Signs: Temp: 96.8 F (36 C) (09/19 2308) Temp Source: Axillary (09/19 2308) BP: 170/91 (09/20 0019) Pulse Rate: 97 (09/20 0019)  Labs:  Recent Labs  02/14/17 0448 02/15/17 0416 02/15/17 1357 02/15/17 2333  HGB  --  19.1* 18.5*  --   HCT  --  57.2* 56.7*  --   PLT  --  146* 111*  --   APTT  --   --  24 40*  LABPROT  --   --  15.8*  --   INR  --   --  1.27  --   HEPARINUNFRC  --   --  1.70* 1.54*  CREATININE 0.82 0.76 0.90  --   TROPONINI  --   --  0.25*  --     Estimated Creatinine Clearance: 33.4 mL/min (by C-G formula based on SCr of 0.9 mg/dL).   Medical History: Past Medical History:  Diagnosis Date  . Arthritis    OA  . Fracture of femoral neck (HCC) 02/11/2016   LEFT  . Hypertension     Medications:  Scheduled:  .  stroke: mapping our early stages of recovery book   Does not apply Once  . atorvastatin  40 mg Oral q1800  . feeding supplement  1 Container Oral BID BM  . feeding supplement (ENSURE ENLIVE)  237 mL Oral Q24H  . multivitamin with minerals  1 tablet Oral Daily   Infusions:  . diltiazem (CARDIZEM) infusion 5 mg/hr (02/16/17 0006)  . heparin     PRN: acetaminophen, hydrALAZINE, Influenza vac split quadrivalent PF, nitroGLYCERIN, ondansetron (ZOFRAN) IV  Assessment: 83 yo female admitted with new onset atrial fibrillation who was started on apixaban. Code stroke called 9/19 due to new aphasia with left gaze preference. Neurology consulted, STAT head CT and MRI obtained and Pharmacy consulted to dose IV heparin.   Last dose of apixaban given 9/18 at 20:58 Baseline PTT = 24 Baseline PT/INR = 1.27/15.8 Baseline Heparin  level = 1.70 (elevated d/t recent DOAC use) Plts low but stable SCr: 0.76, CrCl ~37 ml/min 2333 aptt=40 sec and HL= 1.54, aPtt below goal, no infusion or bleeding issues per RN  Goal of Therapy:  Heparin level 0.3-0.5 units/ml  PTT 66-85 seconds Monitor platelets by anticoagulation protocol: Yes   Plan:   Increase heparin drip to 700 units/hr  Check PTT and heparin level 8hr after increasing drip - titrate heparin using PTT as the effects of recent apixaban use will elevate heparin level  Check daily PTT and heparin level until both levels correlate and effects of apixaban have diminished, then may titrate heparin based on heparin level only   Susanne Greenhouse R 02/16/2017,12:29 AM

## 2017-02-16 NOTE — Progress Notes (Addendum)
ANTICOAGULATION CONSULT NOTE - Follow Up Consult  Pharmacy Consult for Heparin Indication: atrial fibrillation, CVA  Allergies  Allergen Reactions  . Aspirin     nosebleed    Patient Measurements: Height: 5' (152.4 cm) Weight: 112 lb 3.4 oz (50.9 kg) IBW/kg (Calculated) : 45.5  Heparin Dosing Weight: actual weight  Vital Signs: Temp: 97 F (36.1 C) (09/20 0700) Temp Source: Axillary (09/20 0700) BP: 119/40 (09/20 0600) Pulse Rate: 61 (09/20 0616)  Labs:  Recent Labs  02/15/17 0416 02/15/17 1357 02/15/17 2333 02/16/17 0356 02/16/17 0836  HGB 19.1* 18.5*  --  19.2*  --   HCT 57.2* 56.7*  --  60.0*  --   PLT 146* 111*  --  132*  --   APTT  --  24 40*  --  151*  LABPROT  --  15.8*  --   --   --   INR  --  1.27  --   --   --   HEPARINUNFRC  --  1.70* 1.54*  --  1.70*  CREATININE 0.76 0.90  --   --  0.78  TROPONINI  --  0.25*  --   --   --     Estimated Creatinine Clearance: 37.6 mL/min (by C-G formula based on SCr of 0.78 mg/dL).   Medical History: Past Medical History:  Diagnosis Date  . Arthritis    OA  . Fracture of femoral neck (HCC) 02/11/2016   LEFT  . Hypertension     Medications:  Scheduled:  .  stroke: mapping our early stages of recovery book   Does not apply Once  . atorvastatin  40 mg Oral q1800  . feeding supplement  1 Container Oral BID BM  . feeding supplement (ENSURE ENLIVE)  237 mL Oral Q24H  . mouth rinse  15 mL Mouth Rinse BID  . multivitamin with minerals  1 tablet Oral Daily   Infusions:  . diltiazem (CARDIZEM) infusion 5 mg/hr (02/16/17 0616)  . heparin 700 Units/hr (02/16/17 0400)   PRN: acetaminophen, hydrALAZINE, Influenza vac split quadrivalent PF, nitroGLYCERIN, ondansetron (ZOFRAN) IV  Assessment: 83 yo female admitted with new onset atrial fibrillation who was started on apixaban. Code stroke called 9/19 due to new aphasia with left gaze preference. Neurology consulted, STAT head CT and MRI obtained and Pharmacy  consulted to dose IV heparin.   Last dose of apixaban given 9/18 at 20:58 Baseline PTT = 24 Baseline PT/INR = 1.27/15.8 Baseline Heparin level = 1.70 (elevated d/t recent DOAC use)  Today, 02/16/2017:  First aPTT low at 40 seconds with heparin infusion at 600 units/hr. Heparin level 1.54, beginning to trend down as apixaban is eliminated.  This morning's aPTT jumped to 151 seconds despite the modest increase in heparin level to 700 units/hr.  Lab reports that incorrect tube was used during collection since hematocrit elevated.  Lab will redraw this morning's aPTT now and correct level.  Hgb high, platelets low but stable  SCr stable, CrCl~37 ml/min  Goal of Therapy:  Heparin level 0.3-0.5 units/ml  PTT 66-85 seconds Monitor platelets by anticoagulation protocol: Yes   Plan:  Continue heparin at current rate of 700 units/hr so that lab can be redrawn to evaluate current rate, anticipating that high aPTT is inaccurate per discussion with lab.  F/u level when available and adjust heparin rate as needed.  Clance Boll, PharmD, BCPS Pager: [phone] 02/16/2017 10:54 AM   ADDENDUM: 02/16/2017 12:32 PM Repeat aPTT 142 seconds, remains high No bleeding reported  Plan: Hold heparin infusion x 1 hour then resume at 600 units/hr. Check aPTT 8 hours after heparin resumed.  Will plan on checking heparin level at least once daily to determine when levels correlate with aPTT and effects of apixaban have diminished. Daily CBC.  Clance Boll, PharmD Pager: [phone] 02/16/2017

## 2017-02-16 NOTE — Progress Notes (Signed)
Attempt PIV placement.  Assessed both arms including with ultrasound and can find no suitable vein.  Both arms also very bruised with hematomas/ swelling.  If further IV access desired may consider PICC line placement.

## 2017-02-16 NOTE — Progress Notes (Addendum)
Pt has very fragile skin/ vasculature and has been difficult for lab draws/ IV access. RN consulted IV team to place 2nd PIV. IV team was unable to place PIV, despite using ultrasound. Patient currently has one PIV. MD on call paged, RN and IV team recommend PICC placement for for Pt.

## 2017-02-16 NOTE — Progress Notes (Signed)
STROKE TEAM PROGRESS NOTE   HISTORY OF PRESENT ILLNESS (per record) Rachel [name] is a 83 y.o. female was a past medical history of hypertension and cardiac arrhythmia who was evaluated in the emergency room at [facility] for generalized weakness and malaise.She was noted to have a change in mentation somewhere around 1 PM yesterday.  She was examined by the primary team this morning and was noted to be aphasic and left gaze preference. A code stroke was called. A stat non-contrast CT of the head was done.  Also of note, the patient is on Eliquis for atrial fibrillation and LV thrombus.  She was out of the tPA window because of being on Eliquis as well as last known normal greater than 4-1/2 hours.  CT of the head and neck, with no large vessel occlusion, hence she was not a candidate for endovascular treatment.  Patient's mental status change is confirmed by the caregiver, and has been ongoing since 1 PM yesterday. A code stroke was activated today, 02/15/2017 at 10 AM.  LKW: 1 PM on 02/14/2017 Premorbid modified Rankin scale (mRS):  4-Needs assistance to walk and tend to bodily needs  Patient was not administered IV t-PA secondary to presenting outside of window/anticoagulation with Eliquis. She was admitted to the ICU for further evaluation and treatment.   SUBJECTIVE (INTERVAL HISTORY) Her RN is at the bedside.    Heparin level 1.70 on 02/16/2017.   OBJECTIVE Temp:  [96.9 F (36.1 C)-97.7 F (36.5 C)] 97.6 F (36.4 C) (09/21 0759) Pulse Rate:  [58-79] 58 (09/21 0759) Cardiac Rhythm: Atrial fibrillation (09/21 0700) Resp:  [19-29] 22 (09/21 0759) BP: (115-171)/(47-101) 154/59 (09/21 0759) SpO2:  [100 %] 100 % (09/21 0759) Weight:  [49.9 kg (110 lb 0.2 oz)] 49.9 kg (110 lb 0.2 oz) (09/21 0413)  CBC:   Recent Labs Lab 02/15/17 0416 02/15/17 1357 02/16/17 0356  WBC 11.1* 11.6* 9.2  NEUTROABS 8.1* 9.0*  --   HGB 19.1* 18.5* 19.2*  HCT 57.2* 56.7* 60.0*  MCV 82.8  83.0 85.3  PLT 146* 111* 132*    Basic Metabolic Panel:   Recent Labs Lab 02/14/17 0448 02/15/17 0416 02/15/17 1357 02/16/17 0836  NA 139 140 138 139  K 4.0 4.0 3.8 4.3  CL 100* 99* 97* 99*  CO2 GLUCOSE 87 104* 101* 93  BUN 48* 47* 44* 42*  CREATININE 0.82 0.76 0.90 0.78  CALCIUM 8.6* 9.0 9.0 9.1  MG 1.7 2.1  --   --   PHOS 2.2* 2.3*  --   --     Lipid Panel:     Component Value Date/Time   CHOL 146 02/16/2017 0357   TRIG 85 02/16/2017 0357   HDL 46 02/16/2017 0357   CHOLHDL 3.2 02/16/2017 0357   VLDL 17 02/16/2017 0357   LDLCALC 83 02/16/2017 0357   HgbA1c:  Lab Results  Component Value Date   HGBA1C 5.8 (H) 02/16/2017   Urine Drug Screen: No results found for: LABOPIA, COCAINSCRNUR, LABBENZ, AMPHETMU, THCU, LABBARB  Alcohol Level No results found for: ETH  IMAGING  Ct Angio Head W Or Wo Contrast Ct Angio Neck W Or Wo Contrast 02/15/2017 IMPRESSION: 1. Negative for emergent large vessel occlusion, normal for age anterior circulation, and negative for arterial stenosis in the neck. 2. There is a mild to moderate stenosis of the right PCA distal P1 segment. Otherwise the posterior circulation is negative. 3. Stable CT appearance of the brain  since 1016 hours today. 4. Bilateral layering pleural effusions, moderate or large on the right.   Mr Brain Wo Contrast 02/15/2017 IMPRESSION: Widespread multifocal areas of acute infarction, nonhemorrhagic, throughout both cerebral hemispheres, as well as the RIGHT cerebellar hemisphere, most consistent with a shower of emboli. The dominant hemispheric involvement is within the LEFT ICA/MCA territory.  Dg Chest Port 1 View 02/16/2017 IMPRESSION: Interval placement of left-sided PICC line with distal tip in expected position of SVC. Stable cardiomegaly and central pulmonary vascular congestion, with probable bibasilar edema or atelectasis. Mild bilateral pleural effusions are noted.   Dg Chest Port 1  View 02/16/2017 IMPRESSION: Improving right perihilar airspace process. Subtle hazy bibasilar density likely small effusions without significant change.   Ct Head Code Stroke Wo Contrast 02/15/2017 IMPRESSION: 1. Atrophy and small vessel disease. No acute cortical infarction or large vessel occlusion is evident. 2. ASPECTS is 10.  TTE 02/09/2017 Study Conclusions - Left ventricle: The cavity size was normal. There was mild concentric hypertrophy. Systolic function was severely reduced.  The estimated ejection fraction was in the range of 25% to 30%.  There is severe hypokinesis of the anteroseptal and anterior myocardium. There is moderate hypokinesis of the lateral and inferior myocardium. - Aortic valve: There was mild to moderate regurgitation. - Mitral valve: Calcified annulus. Mildly thickened, mildly calcified leaflets . There was mild regurgitation. - Left atrium: The atrium was moderately dilated. There was an apparent, small, 2.4 cm (L) x 1.8 cm (W), regular, solid, fixed mass in the appendage; the appearance is consistent with thrombus; based on its appearance, tumor cannot be excluded. - Right ventricle: The cavity size was mildly dilated. Wall thickness was normal. Systolic function was mildly reduced. - Right atrium: The atrium was severely dilated. - Tricuspid valve: There was severe regurgitation. - Pulmonary arteries: Systolic pressure was moderately increased.  PA peak pressure: 60 mm Hg (S). - Pericardium, extracardiac: A sma ll, partially loculated pericardial effusion was identified.  PHYSICAL EXAM Frail cachectic malnourished appearing elderly caucasian lady. She has pigeon breasted chest. . Afebrile. Head is nontraumatic. Neck is supple without bruit.    Cardiac exam no murmur or gallop. Lungs are clear to auscultation. Distal pulses are well felt. Neurological Exam :  Awake alert. Dysarthria and fluent aphasia and difficulty understanding. Can follow only simple midline  and one-step commands. Unable to name or repeat or read. Diminished attention, registration and recall. Extraocular moments are full range without nystagmus. Blinks to threat bilaterally. Fundi were not visualized. Mild right lower facial asymmetry. Motor system exam able to move both upper and lower extremity against gravity without definite left but mild weakness of right grip and intrinsic hand muscles. Lower extremity strength seems symmetric. Not cooperative for testing coordination and sensation reliably. Deep tendon pulses are symmetric. Plantars are both downgoing. Gait was not tested. ASSESSMENT/PLAN Ms. KHADIJAH MASTRIANNI is a 83 y.o. female with history of  hypertension and cardiac arrhythmia who was evaluated in the emergency room at [facility] for generalized weakness and malaise. She did not receive IV t-PA due to arriving outside of the treatment window/anticoagulation with Eliquis.   Stroke: Multifocal acute infarctions in both cerebral hemispheres, as well as the RIGHT cerebellar hemisphere, embolic, in the setting of anticoagulation with Eliquis for atrial fibrillation with known LV thrombus.  Resultant  aphasia and mild right hemiparesis   CT head: Atrophy and small vessel disease.  MRI head: Multifocal acute infarctions in both cerebral hemispheres, as well as  the RIGHT cerebellar hemisphere, primary focus in L ICA/MCA territory  MRA head: not performed  CTA Head/Neck: No LVO or high-grade stenosis  2D Echo:  There was an apparent, small, 2.4 cm (L) x 1.8 cm (W), regular, solid, fixed mass in the appendage; the appearance is consistent with thrombus   LDL 83  HgbA1c 5.8  SCDs for VTE prophylaxis Diet NPO time specified  Eliquis (apixaban) daily prior to admission, now on heparin IV  Patient counseled to be compliant with her antithrombotic medications  Ongoing aggressive stroke risk factor management  Therapy recommendations: SNF  Disposition:  pending  Hypertension  Stable  Permissive hypertension (OK if < 220/120) but gradually normalize in 5-7 days  Long-term BP goal normotensive  Hyperlipidemia  Home meds: none  LDL 83, goal < 70  Add atorvastatin 40 mg PO daily  Continue statin at discharge  Prediabetes  HgbA1c 5.8, goal < 7.0  Controlled  Other Stroke Risk Factors  Advanced age  Other Active Problems  Polycythemia, mild: Hgb 19.1, Hct 57.2  Hospital day # 9  I have personally examined this patient, reviewed notes, independently viewed imaging studies, participated in medical decision making and plan of care.ROS completed by me personally and pertinent positives fully documented  I have made any additions or clarifications directly to the above note. She has presented with bi-cerebral embolic infarcts in setting of new onset atrial fibrillation. Recommend IV heparin and rate control to the patient is able to swallow safely. Speech therapy involved for swallowing if passes switched to eliquis. Physical, occupational therapy consults. No family available for discussion. Greater than 50% time during this time during this 35 minute visit was spent on counseling and coordination of care about her embolic strokes, atrial fibrillation and treatment and prevention discussion.  Delia Heady, MD Medical Director [facility] Pager: [phone] 02/17/2017 10:55 AM   To contact Stroke Continuity provider, please refer to [email or web address]. After hours, contact General Neurology

## 2017-02-16 NOTE — Evaluation (Signed)
Occupational Therapy Evaluation Patient Details Name: Rachel [name] MRN: [id number] DOB: 1931-01-19 Today's Date: 02/16/2017    History of Present Illness 83 yo female admitted with A fib, Pna, CHF. Hx of L hip hemi-posterior 2017, OA, arrythmia, HTN.  Code Stroke was called during admission.  Pt found to have widespread acute infarcts   Clinical Impression   Pt was admitted for the above.  Of note, a code stroke was called and she was found to have multiple acute infarcts. Pt had PCA at home prior to admission and she walked with a RW.  Pt currently needs max to total +2 assist for most adls. She will benefit from continued OT. Goals in acute are for min to mod A.    Follow Up Recommendations  SNF    Equipment Recommendations   (to be further assessed, ?3:1)    Recommendations for Other Services       Precautions / Restrictions Precautions Precautions: Fall Restrictions Weight Bearing Restrictions: No      Mobility Bed Mobility            total A +2 for sit<>supine      Transfers                 General transfer comment: did not attempt today    Balance   Sitting-balance support: Feet unsupported Sitting balance-Leahy Scale: Fair Sitting balance - Comments: pt sat unsupported for approximately 5 minutes; feet unsupported due to height of bed.  min guard for safety                                   ADL either performed or assessed with clinical judgement   ADL Overall ADL's : Needs assistance/impaired Eating/Feeding: NPO   Grooming:  (hands/face min A; hair max/total A)   Upper Body Bathing: Maximal assistance;Bed level   Lower Body Bathing: Total assistance;+2 for physical assistance;Bed level   Upper Body Dressing : Maximal assistance;Bed level   Lower Body Dressing: Total assistance;+2 for physical assistance;Bed level                 General ADL Comments: pt sat EOB and was able to maintain balance without UE/LE  support (due to height of bed).     Vision  not tested       Perception     Praxis Praxis Praxis-Other Comments: pt did not have mirror, but attempted to comb hair with comb turned away from head (teeth pointed away from head)    Pertinent Vitals/Pain Pain Assessment: Faces Faces Pain Scale: No hurt     Hand Dominance Right   Extremity/Trunk Assessment Upper Extremity Assessment Upper Extremity Assessment: Generalized weakness (moves bil UEs)           Communication Communication Communication: Expressive difficulties (some words automatic; could not express self all the time and knew this--shook her head no)   Cognition Arousal/Alertness: Awake/alert Behavior During Therapy: WFL for tasks assessed/performed                                   General Comments: pt is following most commands:  moving limbs, washing face   General Comments  supine BP 130/62; sitting BP 152/70    Exercises     Shoulder Instructions      Home Living Family/patient expects to be discharged to::  Unsure Living Arrangements: Alone Available Help at Discharge: Personal care attendant                                    Prior Functioning/Environment Level of Independence: Needs assistance  Gait / Transfers Assistance Needed: ambulatory with a RW-household distances per pt ADL's / Homemaking Assistance Needed: aide assists with household tasks            OT Problem List: Decreased strength;Decreased activity tolerance;Impaired balance (sitting and/or standing);Pain;Decreased knowledge of use of DME or AE (praxis, communication. Vision NT)      OT Treatment/Interventions: Self-care/ADL training;DME and/or AE instruction;Patient/family education;Balance training;Therapeutic exercise;Therapeutic activities    OT Goals(Current goals can be found in the care plan section) Acute Rehab OT Goals Patient Stated Goal: none stated OT Goal Formulation: With  patient (in general terms) Time For Goal Achievement: 03/02/17 Potential to Achieve Goals: Good ADL Goals Pt Will Perform Eating: with min assist;sitting Pt Will Perform Grooming: with min assist;sitting (x 2 tasks) Pt Will Perform Upper Body Bathing: with min assist;sitting Pt Will Perform Upper Body Dressing: with min assist;sitting Pt Will Transfer to Toilet: with mod assist;with +2 assist;bedside commode;stand pivot transfer Additional ADL Goal #1: pt will stand with min assist and maintain for 2 minutes with min A for adls Additional ADL Goal #2: pt will be able to self correct use of ADL supplies with multimodal cues (IE turning comb in correct orientation)  OT Frequency: Min 2X/week   Barriers to D/C:            Co-evaluation PT/OT/SLP Co-Evaluation/Treatment: Yes Reason for Co-Treatment: For patient/therapist safety PT goals addressed during session: Mobility/safety with mobility;Balance OT goals addressed during session: ADL's and self-care      AM-PAC PT "6 Clicks" Daily Activity     Outcome Measure Help from another person eating meals?: A Lot Help from another person taking care of personal grooming?: A Lot Help from another person toileting, which includes using toliet, bedpan, or urinal?: Total Help from another person bathing (including washing, rinsing, drying)?: A Lot Help from another person to put on and taking off regular upper body clothing?: A Lot Help from another person to put on and taking off regular lower body clothing?: Total 6 Click Score: 10   End of Session    Activity Tolerance: Patient tolerated treatment well Patient left: in bed;with call bell/phone within reach;with bed alarm set;with family/visitor present  OT Visit Diagnosis: Muscle weakness (generalized) (M62.81)                Time: 9326-7124 OT Time Calculation (min): 21 min Charges:  OT General Charges $OT Visit: 1 Visit OT Evaluation $OT Eval Low Complexity: 1 Low G-Codes:      [place], OTR/L [phone] 02/16/2017  Nichoel Digiulio 02/16/2017, 12:36 PM

## 2017-02-17 ENCOUNTER — Inpatient Hospital Stay (HOSPITAL_COMMUNITY): Payer: Medicare Other

## 2017-02-17 DIAGNOSIS — I639 Cerebral infarction, unspecified: Secondary | ICD-10-CM

## 2017-02-17 DIAGNOSIS — L8990 Pressure ulcer of unspecified site, unspecified stage: Secondary | ICD-10-CM | POA: Insufficient documentation

## 2017-02-17 LAB — APTT
APTT: 100 s — AB (ref 24–36)
APTT: 85 s — AB (ref 24–36)
APTT: 35 s (ref 24–36)
aPTT: 68 seconds — ABNORMAL HIGH (ref 24–36)

## 2017-02-17 LAB — COMPREHENSIVE METABOLIC PANEL
ALT: 21 U/L (ref 14–54)
ANION GAP: 8 (ref 5–15)
AST: 33 U/L (ref 15–41)
Albumin: 2.7 g/dL — ABNORMAL LOW (ref 3.5–5.0)
Alkaline Phosphatase: 53 U/L (ref 38–126)
BUN: 39 mg/dL — ABNORMAL HIGH (ref 6–20)
CHLORIDE: 102 mmol/L (ref 101–111)
CO2: 30 mmol/L (ref 22–32)
Calcium: 8.9 mg/dL (ref 8.9–10.3)
Creatinine, Ser: 0.72 mg/dL (ref 0.44–1.00)
Glucose, Bld: 85 mg/dL (ref 65–99)
POTASSIUM: 3.9 mmol/L (ref 3.5–5.1)
SODIUM: 140 mmol/L (ref 135–145)
Total Bilirubin: 1.1 mg/dL (ref 0.3–1.2)
Total Protein: 4.9 g/dL — ABNORMAL LOW (ref 6.5–8.1)

## 2017-02-17 LAB — CBC
HCT: 50.9 % — ABNORMAL HIGH (ref 36.0–46.0)
Hemoglobin: 15.6 g/dL — ABNORMAL HIGH (ref 12.0–15.0)
MCH: 26.1 pg (ref 26.0–34.0)
MCHC: 30.6 g/dL (ref 30.0–36.0)
MCV: 85.1 fL (ref 78.0–100.0)
PLATELETS: 124 10*3/uL — AB (ref 150–400)
RBC: 5.98 MIL/uL — AB (ref 3.87–5.11)
RDW: 16.5 % — AB (ref 11.5–15.5)
WBC: 7.9 10*3/uL (ref 4.0–10.5)

## 2017-02-17 LAB — HEPARIN LEVEL (UNFRACTIONATED)
HEPARIN UNFRACTIONATED: 0.94 [IU]/mL — AB (ref 0.30–0.70)
Heparin Unfractionated: 0.54 IU/mL (ref 0.30–0.70)

## 2017-02-17 MED ORDER — HEPARIN (PORCINE) IN NACL 100-0.45 UNIT/ML-% IJ SOLN
550.0000 [IU]/h | INTRAMUSCULAR | Status: AC
  Administered 2017-02-17: 400 [IU]/h via INTRAVENOUS

## 2017-02-17 MED ORDER — RESOURCE THICKENUP CLEAR PO POWD
ORAL | Status: DC | PRN
  Filled 2017-02-17 (×2): qty 125

## 2017-02-17 NOTE — Progress Notes (Signed)
ANTICOAGULATION CONSULT NOTE - Follow Up Consult  Pharmacy Consult for heparin Indication: atrial fibrillation w/ acute CVA/cardioembolic infarcts  Labs:  Recent Labs  02/15/17 0416  02/15/17 1357 02/15/17 2333 02/16/17 0356 02/16/17 0836 02/16/17 1152 02/16/17 2300 02/17/17 0110  HGB 19.1*  --  18.5*  --  19.2*  --   --   --   --   HCT 57.2*  --  56.7*  --  60.0*  --   --   --   --   PLT 146*  --  111*  --  132*  --   --   --   --   APTT  --   < > 24 40*  --  151* 142* 100* 85*  LABPROT  --   --  15.8*  --   --   --  16.5*  --   --   INR  --   --  1.27  --   --   --  1.34  --   --   HEPARINUNFRC  --   < > 1.70* 1.54*  --  1.70* 1.76*  --   --   CREATININE 0.76  --  0.90  --   --  0.78  --   --   --   TROPONINI  --   --  0.25*  --   --   --   --   --   --   < > = values in this interval not displayed.   Assessment/Plan:  83yo female therapeutic on heparin after rate change. Will continue gtt at current rate and confirm stable with additional PTT.   Vernard Gambles, PharmD, BCPS  02/17/2017,1:49 AM

## 2017-02-17 NOTE — Progress Notes (Signed)
ANTICOAGULATION CONSULT NOTE - Follow Up Consult  Pharmacy Consult for Heparin Indication: atrial fibrillation, CVA  Allergies  Allergen Reactions  . Aspirin     nosebleed    Patient Measurements: Height: 5' (152.4 cm) Weight: 110 lb 0.2 oz (49.9 kg) IBW/kg (Calculated) : 45.5  Heparin Dosing Weight: actual weight  Vital Signs: Temp: 98 F (36.7 C) (09/21 2133) Temp Source: Oral (09/21 2133) BP: 123/90 (09/21 2145) Pulse Rate: 83 (09/21 2145)  Labs:  Recent Labs  02/15/17 1357  02/16/17 0356 02/16/17 0836 02/16/17 1152  02/17/17 0110 02/17/17 1000 02/17/17 2205  HGB 18.5*  --  19.2*  --   --   --   --  15.6*  --   HCT 56.7*  --  60.0*  --   --   --   --  50.9*  --   PLT 111*  --  132*  --   --   --   --  124*  --   APTT 24  < >  --  151* 142*  < > 85* 68* 35  LABPROT 15.8*  --   --   --  16.5*  --   --   --   --   INR 1.27  --   --   --  1.34  --   --   --   --   HEPARINUNFRC 1.70*  < >  --  1.70* 1.76*  --   --  0.94* 0.54  CREATININE 0.90  --   --  0.78  --   --   --  0.72  --   TROPONINI 0.25*  --   --   --   --   --   --   --   --   < > = values in this interval not displayed.  Estimated Creatinine Clearance: 37.6 mL/min (by C-G formula based on SCr of 0.72 mg/dL).  Assessment: 83 yo female admitted with new onset atrial fibrillation who was started on apixaban. Code stroke called 9/19 due to new aphasia with left gaze preference. Neurology consulted, STAT head CT and MRI obtained and Pharmacy consulted to dose IV heparin while apixaban on hold (last 9/18).   Were following heparin levels up to this point, although appears heparin level and aPTT not quite correlating. Heparin level slightly supratherapeutic at 0.54 for low goal 0.3-0.5 and aPTT is subtherapeutic. CBC stable and no bleeding noted.   APTT seems more reliable at this time, and so will use this to dose heparin gtt. Hgb downtrending and plts low-stable. No s/s bleeding or neurological changes per  RN.   Goal of Therapy:  Heparin level 0.3-0.5 units/ml  aptt 66-84  Monitor platelets by anticoagulation protocol: Yes   Plan:  Increase heparin gtt to 550 units/hr Heparin level and aPTT in 8 hrs Daily heparin level, aPTT, and CBC Monitor for s/s bleeding  York Cerise, PharmD Clinical Pharmacist 02/18/17 12:49 AM

## 2017-02-17 NOTE — Evaluation (Signed)
Speech Language Pathology Evaluation Patient Details Name: Rachel [name] MRN: [id number] DOB: 05-30-1931 Today's Date: 02/17/2017 Time: 1202-1209 SLP Time Calculation (min) (ACUTE ONLY): 7 min  Problem List:  Patient Active Problem List   Diagnosis Date Noted  . Stroke (HCC) 02/15/2017  . Malnutrition of moderate degree 02/10/2017  . New onset atrial fibrillation (HCC) 02/08/2017  . CAP (community acquired pneumonia) 02/08/2017  . Renal insufficiency 02/08/2017  . NSTEMI (non-ST elevated myocardial infarction) (HCC) 02/08/2017  . Acute CHF (congestive heart failure) (HCC) 02/08/2017  . General weakness 02/08/2017  . Fall   . Displaced fracture of left femoral neck (HCC) 02/11/2016  . HTN (hypertension) 02/11/2016  . Dehydration, moderate 02/11/2016  . Elevated CPK 02/11/2016   Past Medical History:  Past Medical History:  Diagnosis Date  . Arthritis    OA  . Fracture of femoral neck (HCC) 02/11/2016   LEFT  . Hypertension    Past Surgical History:  Past Surgical History:  Procedure Laterality Date  . APPENDECTOMY    . BREAST SURGERY     BIOPSY  . HIP ARTHROPLASTY Left 02/12/2016   Procedure: ARTHROPLASTY BIPOLAR HIP (HEMIARTHROPLASTY);  Surgeon: [name], MD;  Location: [facility] OR;  Service: Orthopedics;  Laterality: Left;   HPI:  83 yo female admitted with A fib, Pna, CHF. Hx of L hip hemi-posterior 2017, OA, arrythmia, HTN.  Code Stroke was called during admission.  Pt found to have widespread acute infarcts   Assessment / Plan / Recommendation Clinical Impression  Pt is hypophonic with imprecise articulation, but also with expressive > receptive aphasia, which significantly impacts her communication at this time. SLP provided Mod cues for completion of one-step commands. Verbal expression is difficult to fully assess given her motor speech issues, but it appears to be mostly jargon. She did state her name x1, but it was followed by additional phonemes. She seems only  intermittently aware of her linguistic errors. Recommend SLP f/u acutely and at next level of care.    SLP Assessment  SLP Recommendation/Assessment: Patient needs continued Speech Lanaguage Pathology Services SLP Visit Diagnosis: Aphasia (R47.01);Dysarthria and anarthria (R47.1)    Follow Up Recommendations  Skilled Nursing facility    Frequency and Duration min 2x/week  2 weeks      SLP Evaluation Cognition  Overall Cognitive Status: Difficult to assess Arousal/Alertness: Awake/alert Orientation Level: Oriented to person Awareness: Impaired Awareness Impairment: Emergent impairment       Comprehension  Auditory Comprehension Overall Auditory Comprehension: Impaired Yes/No Questions: Impaired Basic Biographical Questions: 51-75% accurate Basic Immediate Environment Questions: 50-74% accurate Commands: Impaired One Step Basic Commands: 50-74% accurate    Expression Expression Primary Mode of Expression: Verbal Verbal Expression Overall Verbal Expression: Impaired Initiation: No impairment Automatic Speech: Name Naming: Impairment Confrontation: Impaired Verbal Errors: Jargon Non-Verbal Means of Communication: Other (comment) (does not use gestures despite cueing)   Oral / Motor  Oral Motor/Sensory Function Overall Oral Motor/Sensory Function:  (difficulty following commands, mild R weakness) Motor Speech Overall Motor Speech: Impaired Phonation: Low vocal intensity Articulation: Impaired Level of Impairment: Phrase Intelligibility:  (difficult to assess due to aphasia)   GO                    Maxcine Ham 02/17/2017, 1:12 PM  Maxcine Ham, M.A. CCC-SLP [phone]

## 2017-02-17 NOTE — Progress Notes (Signed)
PROGRESS NOTE    Rachel [name]  ZOX:[id number] DOB: 1931/01/14 DOA: 02/08/2017 PCP: Renford Dills, MD   Chief Complaint  Patient presents with  . generalized weakness  . poor po intake  . non compliant with medications    Brief Narrative:  HPI on 9/12/218 by Dr. Marcial Pacas Opyd Rachel [name] is a 83 y.o. female with medical history significant for hypertension, osteoarthritis, and history of cardiac arrhythmia, now presenting to the emergency department for evaluation of generalized weakness and malaise. Patient was accompanied by her caretaker, with whom she lives, reporting that the patient has been experiencing increasing generalized weakness over the past couple days, much worse today. Patient was seated on the toilet, but was too weak to get up, prompting the caretaker to call EMS for evaluation. Patient required significant assistance just to stand and was brought into the ED for evaluation. She denies any recent fevers or chills, acknowledges some increased dyspnea and a slight cough, but denies any chest pain or palpitations. She also denies headache, change in vision or hearing, or focal numbness or weakness.   Interim history Patient was measured for new onset atrial fibrillation and was placed on Cardizem gtt. She is also found to be in volume overload with BNP of 3637, new heart failure and placed on IV Lasix. Patient underwent echocardiogram found to have an EF of 25% with severe hypokinesis. Patient also has a small thrombus in the left atrium. Cardiology was consulted and has signed off. On 02/15/2017 patient developed new A fascia and code stroke was initiated. Neurology was consulted. Patient was transferred to [facility] for neurology stroke team consult as well as further workup. Patient currently on heparin.  Assessment & Plan   Acute CVA with significant aphasia -First noted by caregivers on 02/14/2017, patient was seen by [name] who explained the physician at that  time that things were not normal. -On exam patient was noted to have aphasia with left gaze preference. -Code stroke was initiated on the morning of 02/15/2017. -Thought to be secondary to LA thrombus. -Neurology consultation appreciated -CT head: No acute cortical infarction or large vessel occlusion evident -MRI brain: Widespread multifocal areas of acute infarction, nonhemorrhagic, throughout both cerebral hemispheres, as well as right cerebral hemisphere is consistent with shower of emboli. -CTA head and neck negative for emergent large vessel occlusion, normal for age anterior circulation, negative for arterial stenosis of the neck. Mild to moderate stenosis of the right PCA distal P1 segment. -Speech consulted and recommended NPO except meds (crushed with puree), recommended SNF -PT/OT recommended SNF -Discussed with neurology, [name], when patient is able to pass her swallow evaluation, with transition to Eliquis  New onset age fibrillation with RVR -Currently on heparin and diltiazem gtt. -CHADSVASC at least 53 (age, gender, CHF, Stroke)  Leukocytosis -Resolved, possibly secondary to CVA versus new onset atrial fibrillation versus infection  Acute systolic and diastolic heart failure -BNP on admission 3637 -Echocardiogram EF of 25-30%, severe hypokinesis of the anteroseptal and anterior myocardium. Left atrium with small 2.4 cm x 1.8 cm irregular solid fixed mass in the appendage consistent with thrombus -Cardiology consulted and appreciated, has since signed off -Patient was placed on IV Lasix and losartan- which have been held -Continue to monitor intake and output, daily weights  Elevated troponin -Likely demand ischemia secondary to atrial fibrillation versus CHF versus stroke -Patient currently not a candidate for heart catheterization -Currently on heparin  Pleural effusion -CTA head and neck showed bilateral layering  pleural effusions, moderate or large on the  right  Right upper lobar pneumonia -Blood cultures show no growth to date -Patient completed antibiotic therapy course during hospitalization  Acute kidney injury on chronic kidney disease, stage III -Resolved -Creatinine on mission is 1.23, currently 0.7 -Continue to monitor BMP  Peripheral vascular disease -Left ABI and Doppler waveforms indicate severe reduction in arterial flow or rest -Currently on heparin  Urinary retention -Continue foley catheter  DVT Prophylaxis  Heparin  Code Status: DNR  Family Communication: None at bedside  Disposition Plan: Admitted, continue to monitor in stepdown  Consultants Neurology Cardiology  Procedures  Echocardiogram ABI  Antibiotics   Anti-infectives    Start     Dose/Rate Route Frequency Ordered Stop   02/11/17 1700  cefUROXime (CEFTIN) tablet 500 mg     500 mg Oral 2 times daily with meals 02/11/17 1139 02/13/17 1916   02/10/17 2200  azithromycin (ZITHROMAX) tablet 500 mg  Status:  Discontinued     500 mg Oral Daily at bedtime 02/10/17 0826 02/11/17 1139   02/09/17 2200  cefTRIAXone (ROCEPHIN) 1 g in dextrose 5 % 50 mL IVPB  Status:  Discontinued     1 g 100 mL/hr over 30 Minutes Intravenous Daily at bedtime 02/08/17 2243 02/11/17 1139   02/09/17 2200  azithromycin (ZITHROMAX) 500 mg in dextrose 5 % 250 mL IVPB  Status:  Discontinued     500 mg 250 mL/hr over 60 Minutes Intravenous Every 24 hours 02/08/17 2243 02/10/17 0826   02/08/17 2230  cefTRIAXone (ROCEPHIN) 1 g in dextrose 5 % 50 mL IVPB     1 g 100 mL/hr over 30 Minutes Intravenous  Once 02/08/17 2227 02/09/17 0123   02/08/17 2230  azithromycin (ZITHROMAX) 500 mg in dextrose 5 % 250 mL IVPB     500 mg 250 mL/hr over 60 Minutes Intravenous  Once 02/08/17 2227 02/09/17 9381      Subjective:   [name] seen and examined today.  Patient currently aphasic and difficult to assess.    Objective:   Vitals:   02/17/17 0413 02/17/17 0700 02/17/17 0759 02/17/17  1236  BP: (!) 143/53 (!) 164/101 (!) 154/59 (!) 147/48  Pulse: 76 72 (!) 58 66  Resp: (!) 22 (!) 21 (!) 22 19  Temp: 97.6 F (36.4 C)  97.6 F (36.4 C) (!) 97.5 F (36.4 C)  TempSrc: Oral  Oral Oral  SpO2: 100% 100% 100% 100%  Weight: 49.9 kg (110 lb 0.2 oz)     Height:        Intake/Output Summary (Last 24 hours) at 02/17/17 1304 Last data filed at 02/17/17 1237  Gross per 24 hour  Intake            200.2 ml  Output              545 ml  Net           -344.8 ml   Filed Weights   02/15/17 1300 02/16/17 0434 02/17/17 0413  Weight: 52 kg (114 lb 10.2 oz) 50.9 kg (112 lb 3.4 oz) 49.9 kg (110 lb 0.2 oz)   Exam  General: Well developed, well nourished, NAD, appears stated age  HEENT: NCAT, mucous membranes moist.   Cardiovascular: S1 S2 auscultated, no rubs, murmurs or gallops. Regular rate and rhythm.  Respiratory: Clear to auscultation bilaterally with equal chest rise  Abdomen: Soft, nontender, nondistended, + bowel sounds  Extremities: warm dry without cyanosis clubbing or edema  Neuro: Awake and alert, cannot assess, speech nonseniscal  Psych: Cannot assess   Data Reviewed: I have personally reviewed following labs and imaging studies  CBC:  Recent Labs Lab 02/11/17 0454 02/15/17 0416 02/15/17 1357 02/16/17 0356 02/17/17 1000  WBC 9.4 11.1* 11.6* 9.2 7.9  NEUTROABS 7.1 8.1* 9.0*  --   --   HGB 16.8* 19.1* 18.5* 19.2* 15.6*  HCT 51.7* 57.2* 56.7* 60.0* 50.9*  MCV 81.0 82.8 83.0 85.3 85.1  PLT 122* 146* 111* 132* 124*   Basic Metabolic Panel:  Recent Labs Lab 02/11/17 0454 02/12/17 0442 02/13/17 0500 02/14/17 0448 02/15/17 0416 02/15/17 1357 02/16/17 0836 02/17/17 1000  NA 142 142 138 139 140 138 139 140  K 4.5 3.1* 4.2 4.0 4.0 3.8 4.3 3.9  CL 104 100* 99* 100* 99* 97* 99* 102  CO2 30 32 GLUCOSE 99 103* 92 87 104* 101* 93 85  BUN 43* 40* 41* 48* 47* 44* 42* 39*  CREATININE 1.00 0.99 0.79 0.82 0.76 0.90 0.78 0.72  CALCIUM  9.0 9.0 8.7* 8.6* 9.0 9.0 9.1 8.9  MG 1.7 1.6* 1.7 1.7 2.1  --   --   --   PHOS  --  2.5 2.4* 2.2* 2.3*  --   --   --    GFR: Estimated Creatinine Clearance: 37.6 mL/min (by C-G formula based on SCr of 0.72 mg/dL). Liver Function Tests:  Recent Labs Lab 02/15/17 1357 02/16/17 0836 02/17/17 1000  AST 34 35 33  ALT ALKPHOS 70 61 53  BILITOT 1.3* 1.1 1.1  PROT 5.8* 5.1* 4.9*  ALBUMIN 3.3* 2.9* 2.7*   No results for input(s): LIPASE, AMYLASE in the last 168 hours. No results for input(s): AMMONIA in the last 168 hours. Coagulation Profile:  Recent Labs Lab 02/15/17 1357 02/16/17 1152  INR 1.27 1.34   Cardiac Enzymes:  Recent Labs Lab 02/15/17 1357  TROPONINI 0.25*   BNP (last 3 results) No results for input(s): PROBNP in the last 8760 hours. HbA1C:  Recent Labs  02/16/17 0357  HGBA1C 5.8*   CBG: No results for input(s): GLUCAP in the last 168 hours. Lipid Profile:  Recent Labs  02/16/17 0357  CHOL 146  HDL 46  LDLCALC 83  TRIG 85  CHOLHDL 3.2   Thyroid Function Tests: No results for input(s): TSH, T4TOTAL, FREET4, T3FREE, THYROIDAB in the last 72 hours. Anemia Panel: No results for input(s): VITAMINB12, FOLATE, FERRITIN, TIBC, IRON, RETICCTPCT in the last 72 hours. Urine analysis:    Component Value Date/Time   COLORURINE YELLOW 02/09/2017 0545   APPEARANCEUR CLEAR 02/09/2017 0545   LABSPEC 1.011 02/09/2017 0545   PHURINE 5.0 02/09/2017 0545   GLUCOSEU NEGATIVE 02/09/2017 0545   HGBUR SMALL (A) 02/09/2017 0545   BILIRUBINUR NEGATIVE 02/09/2017 0545   KETONESUR NEGATIVE 02/09/2017 0545   PROTEINUR 100 (A) 02/09/2017 0545   NITRITE NEGATIVE 02/09/2017 0545   LEUKOCYTESUR TRACE (A) 02/09/2017 0545   Sepsis Labs: (procalcitonin:4,lacticidven:4)  ) Recent Results (from the past 240 hour(s))  Culture, blood (routine x 2)     Status: None   Collection Time: 02/08/17 10:34 PM  Result Value Ref Range Status   Specimen  Description BLOOD RIGHT WRIST  Final   Special Requests   Final    BOTTLES DRAWN AEROBIC ONLY Blood Culture adequate volume   Culture   Final    NO GROWTH 5 DAYS Performed at [facility], [address], [place], [place] [address]    Report Status 02/14/2017 FINAL  Final  MRSA PCR Screening     Status: None   Collection Time: 02/09/17 12:09 AM  Result Value Ref Range Status   MRSA by PCR NEGATIVE NEGATIVE Final    Comment:        The GeneXpert MRSA Assay (FDA approved for NASAL specimens only), is one component of a comprehensive MRSA colonization surveillance program. It is not intended to diagnose MRSA infection nor to guide or monitor treatment for MRSA infections.   Culture, blood (routine x 2)     Status: None   Collection Time: 02/09/17 12:44 AM  Result Value Ref Range Status   Specimen Description BLOOD LEFT HAND  Final   Special Requests IN PEDIATRIC BOTTLE Blood Culture adequate volume  Final   Culture   Final    NO GROWTH 5 DAYS Performed at [facility], [address] [place], [place] [address]    Report Status 02/14/2017 FINAL  Final      Radiology Studies: Dg Chest Port 1 View  Result Date: 02/16/2017 CLINICAL DATA:  PICC placement. EXAM: PORTABLE CHEST 1 VIEW COMPARISON:  Radiographs of February 16, 2017. FINDINGS: Stable cardiomegaly and central pulmonary vascular congestion. Atherosclerosis of thoracic aorta is noted. Mild bibasilar edema or atelectasis is noted. Mild bilateral pleural effusions are noted. No pneumothorax is noted. Bony thorax is unremarkable. Interval placement of left-sided PICC line is noted with distal tip in expected position of the SVC. IMPRESSION: Interval placement of left-sided PICC line with distal tip in expected position of SVC. Stable cardiomegaly and central pulmonary vascular congestion, with probable bibasilar edema or atelectasis. Mild bilateral pleural effusions are noted. Electronically Signed   By: Lupita Raider, M.D.   On: 02/16/2017 14:52   Dg Chest Port 1 View  Result Date: 02/16/2017 CLINICAL DATA:  Follow-up pleural effusion. EXAM: PORTABLE CHEST 1 VIEW COMPARISON:  02/08/2017 FINDINGS: Patient is moderately rotated to the left. Lungs are adequately inflated and demonstrate minimal hazy bibasilar density likely small effusions without significant change. Interval improvement in mild opacification of the right perihilar region. Cardiomediastinal silhouette and remainder of the exam is unchanged. IMPRESSION: Improving right perihilar airspace process. Subtle hazy bibasilar density likely small effusions without significant change. Electronically Signed   By: Elberta Fortis M.D.   On: 02/16/2017 08:40     Scheduled Meds: .  stroke: mapping our early stages of recovery book   Does not apply Once  . atorvastatin  40 mg Oral q1800  . Chlorhexidine Gluconate Cloth  6 each Topical Daily  . feeding supplement  1 Container Oral BID BM  . feeding supplement (ENSURE ENLIVE)  237 mL Oral Q24H  . mouth rinse  15 mL Mouth Rinse BID  . multivitamin with minerals  1 tablet Oral Daily  . sodium chloride flush  10-40 mL Intracatheter Q12H   Continuous Infusions: . diltiazem (CARDIZEM) infusion 5 mg/hr (02/17/17 0410)  . heparin       LOS: 9 days   Time Spent in minutes   30 minutes  Havana Baldwin D.O. on 02/17/2017 at 1:04 PM  Between 7am to 7pm - Pager - [phone]  After 7pm go to [email or web address] - password [email or web address]  And look for the night coverage person covering for me after hours  [facility] Office  [phone]

## 2017-02-17 NOTE — Progress Notes (Signed)
Modified Barium Swallow Progress Note  Patient Details  Name: Rachel [name] MRN: [id number] Date of Birth: April 09, 1931  Today's Date: 02/17/2017  Modified Barium Swallow completed.  Full report located under Chart Review in the Imaging Section.  Brief recommendations include the following:  Clinical Impression  Pt has a mild-moderate oropharyngeal dysphagia with suspected esophageal component. SLP provided Min cues for mastication and extra time for posterior transit with all consistencies. She has premature spillage of thin liquids that results in silent aspiration with larger boluses. Even smaller tsp amounts are silently penetrated. She has better oral containment with purees and solids to allow for improved airway protection. Her pharyngeal swallow triggers swiftly but with reduced hyolaryngeal movement and base of tongue retraction that leaves mild vallecular residue behind. Her esophagus appears dilated with decreased clearance and retrograde flow of barium, although MD was not present to confirm. Recommend Dys 2 diet given current cognitive-linguistic function as well as oropharyngeal and esopghageal issues noted above. Would also start with nectar thick liquids to increase safety. Will continue to follow for potential to advance to thin liquids even once pt may be able to follow commands more consistently.   Swallow Evaluation Recommendations       SLP Diet Recommendations: Dysphagia 2 (Fine chop) solids;Nectar thick liquid   Liquid Administration via: Cup;Straw   Medication Administration: Crushed with puree   Supervision: Patient able to self feed;Full supervision/cueing for compensatory strategies   Compensations: Slow rate;Small sips/bites;Follow solids with liquid   Postural Changes: Seated upright at 90 degrees;Remain semi-upright after after feeds/meals (Comment)   Oral Care Recommendations: Oral care BID   Other Recommendations: Order thickener from pharmacy;Prohibited  food (jello, ice cream, thin soups);Remove water pitcher    Maxcine Ham 02/17/2017,4:41 PM   Maxcine Ham, M.A. CCC-SLP [phone]

## 2017-02-17 NOTE — Progress Notes (Signed)
Nutrition Follow-up  DOCUMENTATION CODES:   Non-severe (moderate) malnutrition in context of chronic illness  INTERVENTION:   Mighty Shake II po TID with meals, each supplement provides 290 kcals and 9 grams of protein  NUTRITION DIAGNOSIS:   Malnutrition (moderate/non-severe) related to chronic illness (osteoarthritis) as evidenced by severe depletion of muscle mass, mild depletion of body fat.   Ongoing   GOAL:   Patient will meet greater than or equal to 90% of their needs   Currently Unmet   MONITOR:   PO intake, Supplement acceptance, Weight trends, Labs    ASSESSMENT:   83 year old female with PMH of HTN, osteoarthritis, cardiac arrhythmia, and CHF. Presented to ED for weakness and found to have new onset atrial fibrillation. Code stroke called on 9/19 due to new aphasia.   Pt not in room at time of f/u visit for modified barium swallow study. Transferred from [facility] on 9/20 for neurological evaluation.   Pt eating 25- 100% of meals previously on heart diet. BMI still indicates normal weight. Pt has had stable weight for past year, 112 lbs on 02/11/16.   Labs: Improvement in K and GFR (normal limits). Mg not updated.  GFR 39 (H), Albumin 2.7 (L), Total Protein 4.9 (L)  Medications: Heparin  Addendum: Pt advanced to Dysphagia 2 Nectar-thick liquid diet at 1601.   Diet Order:  DIET DYS 2 Room service appropriate? Yes; Fluid consistency: Nectar Thick  Skin:  Reviewed, no issues  Last BM:  02/15/17  Height:   Ht Readings from Last 1 Encounters:  02/08/17 5' (1.524 m)    Weight:   Wt Readings from Last 1 Encounters:  02/17/17 110 lb 0.2 oz (49.9 kg)    Ideal Body Weight:  45.45 kg  BMI:  Body mass index is 21.48 kg/m.  Estimated Nutritional Needs:   Kcal:  1300-1500 kcals  Protein:  60-75 g  Fluid:  >/=1.5 L  EDUCATION NEEDS:   No education needs identified at this time  Wynetta Emery [facility] Dietetic Intern Pager:  [phone] 02/17/2017 4:12 PM

## 2017-02-17 NOTE — Evaluation (Signed)
Clinical/Bedside Swallow Evaluation Patient Details  Name: Rachel [name] MRN: [id number] Date of Birth: [birth year]  Today's Date: 02/17/2017 Time: SLP Start Time (ACUTE ONLY): 1155 SLP Stop Time (ACUTE ONLY): 1202 SLP Time Calculation (min) (ACUTE ONLY): 7 min  Past Medical History:  Past Medical History:  Diagnosis Date  . Arthritis    OA  . Fracture of femoral neck (HCC) 02/11/2016   LEFT  . Hypertension    Past Surgical History:  Past Surgical History:  Procedure Laterality Date  . APPENDECTOMY    . BREAST SURGERY     BIOPSY  . HIP ARTHROPLASTY Left 02/12/2016   Procedure: ARTHROPLASTY BIPOLAR HIP (HEMIARTHROPLASTY);  Surgeon: [name], MD;  Location: [facility] OR;  Service: Orthopedics;  Laterality: Left;   HPI:  83 yo female admitted with A fib, Pna, CHF. Hx of L hip hemi-posterior 2017, OA, arrythmia, HTN.  Code Stroke was called during admission.  Pt found to have widespread acute infarcts   Assessment / Plan / Recommendation Clinical Impression  Pt has multiple swallows and intermittent throat clearing. She does not have overt coughing even with large boluses, but she also does not cough or swallow on command. Eructation is also noted, making it possible that some of this throat clearing could be more esophageal in nature. Given her cognitive-lingusitic presentation as well, recommend to proceed wtih MBS to better assess. SLP Visit Diagnosis: Dysphagia, unspecified (R13.10)    Aspiration Risk  Moderate aspiration risk    Diet Recommendation NPO except meds   Medication Administration: Crushed with puree    Other  Recommendations Oral Care Recommendations: Oral care QID   Follow up Recommendations        Frequency and Duration            Prognosis Prognosis for Safe Diet Advancement: Good Barriers to Reach Goals: Cognitive deficits;Language deficits      Swallow Study   General HPI: 83 yo female admitted with A fib, Pna, CHF. Hx of L hip hemi-posterior  2017, OA, arrythmia, HTN.  Code Stroke was called during admission.  Pt found to have widespread acute infarcts Type of Study: Bedside Swallow Evaluation Previous Swallow Assessment: none in chart Diet Prior to this Study: NPO Temperature Spikes Noted: No Respiratory Status: Nasal cannula History of Recent Intubation: No Behavior/Cognition: Alert;Cooperative;Requires cueing Oral Cavity Assessment: Within Functional Limits Oral Care Completed by SLP: No Oral Cavity - Dentition: Adequate natural dentition Vision: Functional for self-feeding Self-Feeding Abilities: Needs assist Patient Positioning: Upright in bed Baseline Vocal Quality: Low vocal intensity Volitional Cough: Cognitively unable to elicit Volitional Swallow: Unable to elicit    Oral/Motor/Sensory Function Overall Oral Motor/Sensory Function:  (difficulty following commands, mild R weakness)   Ice Chips Ice chips: Within functional limits Presentation: Spoon   Thin Liquid Thin Liquid: Impaired Presentation: Cup;Self Fed;Spoon;Straw Pharyngeal  Phase Impairments: Multiple swallows;Throat Clearing - Immediate    Nectar Thick Nectar Thick Liquid: Not tested   Honey Thick Honey Thick Liquid: Not tested   Puree Puree: Impaired Presentation: Spoon Pharyngeal Phase Impairments: Throat Clearing - Delayed   Solid   GO   Solid: Not tested        Maxcine Ham 02/17/2017,1:04 PM  Maxcine Ham, M.A. CCC-SLP [phone]

## 2017-02-17 NOTE — Progress Notes (Signed)
ANTICOAGULATION CONSULT NOTE - Follow Up Consult  Pharmacy Consult for Heparin Indication: atrial fibrillation, CVA  Allergies  Allergen Reactions  . Aspirin     nosebleed    Patient Measurements: Height: 5' (152.4 cm) Weight: 110 lb 0.2 oz (49.9 kg) IBW/kg (Calculated) : 45.5  Heparin Dosing Weight: actual weight  Vital Signs: Temp: 97.6 F (36.4 C) (09/21 0759) Temp Source: Oral (09/21 0759) BP: 154/59 (09/21 0759) Pulse Rate: 58 (09/21 0759)  Labs:  Recent Labs  02/15/17 1357  02/16/17 0356 02/16/17 0836 02/16/17 1152 02/16/17 2300 02/17/17 0110 02/17/17 1000  HGB 18.5*  --  19.2*  --   --   --   --  15.6*  HCT 56.7*  --  60.0*  --   --   --   --  50.9*  PLT 111*  --  132*  --   --   --   --  124*  APTT 24  < >  --  151* 142* 100* 85* 68*  LABPROT 15.8*  --   --   --  16.5*  --   --   --   INR 1.27  --   --   --  1.34  --   --   --   HEPARINUNFRC 1.70*  < >  --  1.70* 1.76*  --   --  0.94*  CREATININE 0.90  --   --  0.78  --   --   --  0.72  TROPONINI 0.25*  --   --   --   --   --   --   --   < > = values in this interval not displayed.  Estimated Creatinine Clearance: 37.6 mL/min (by C-G formula based on SCr of 0.72 mg/dL).  Assessment: 83 yo female admitted with new onset atrial fibrillation who was started on apixaban. Code stroke called 9/19 due to new aphasia with left gaze preference. Neurology consulted, STAT head CT and MRI obtained and Pharmacy consulted to dose IV heparin.   Last dose of apixaban given 9/18 at 20:58 HL aptt have been correlating since 9/20 - will ignore the aPTT of 68 this am  HL of 0.94 will be used for hep gtt for now on  Goal of Therapy:  Heparin level 0.3-0.5 units/ml  Monitor platelets by anticoagulation protocol: Yes   Plan:  Hold heparin x 1 hr Restart heparin at 400 units/hr Next hep lvl 2000 Daily HL CBC  Isaac Bliss, PharmD, BCPS, BCCCP Clinical Pharmacist Clinical phone for 02/17/2017 from 7a-3:30p:  [phone] If after 3:30p, please call main pharmacy at: [phone] 02/17/2017 12:20 PM

## 2017-02-17 NOTE — Progress Notes (Signed)
Notified via central telemetry that pt. had a 13 beat run of SVT. Pt. asymptomatic and currently in Afib with HR 60s-70s. Notified Linton Flemings, NP. No new orders at this time. Will continue to monitor.

## 2017-02-17 NOTE — Care Management Important Message (Signed)
Important Message  Patient Details  Name: Rachel [name] MRN: [id number] Date of Birth: [birth year]   Medicare Important Message Given:  Yes    Rachel [name] 02/17/2017, 10:06 AM

## 2017-02-18 DIAGNOSIS — I513 Intracardiac thrombosis, not elsewhere classified: Secondary | ICD-10-CM

## 2017-02-18 DIAGNOSIS — I6340 Cerebral infarction due to embolism of unspecified cerebral artery: Secondary | ICD-10-CM

## 2017-02-18 DIAGNOSIS — J90 Pleural effusion, not elsewhere classified: Secondary | ICD-10-CM

## 2017-02-18 DIAGNOSIS — E440 Moderate protein-calorie malnutrition: Secondary | ICD-10-CM

## 2017-02-18 LAB — COMPREHENSIVE METABOLIC PANEL
ALT: 20 U/L (ref 14–54)
ANION GAP: 8 (ref 5–15)
AST: 29 U/L (ref 15–41)
Albumin: 2.6 g/dL — ABNORMAL LOW (ref 3.5–5.0)
Alkaline Phosphatase: 54 U/L (ref 38–126)
BILIRUBIN TOTAL: 0.7 mg/dL (ref 0.3–1.2)
BUN: 38 mg/dL — AB (ref 6–20)
CO2: 31 mmol/L (ref 22–32)
Calcium: 9.1 mg/dL (ref 8.9–10.3)
Chloride: 101 mmol/L (ref 101–111)
Creatinine, Ser: 0.69 mg/dL (ref 0.44–1.00)
Glucose, Bld: 93 mg/dL (ref 65–99)
Potassium: 4.1 mmol/L (ref 3.5–5.1)
Sodium: 140 mmol/L (ref 135–145)
TOTAL PROTEIN: 4.7 g/dL — AB (ref 6.5–8.1)

## 2017-02-18 LAB — CBC
HEMATOCRIT: 47.6 % — AB (ref 36.0–46.0)
HEMOGLOBIN: 14.6 g/dL (ref 12.0–15.0)
MCH: 26.3 pg (ref 26.0–34.0)
MCHC: 30.7 g/dL (ref 30.0–36.0)
MCV: 85.8 fL (ref 78.0–100.0)
PLATELETS: 118 10*3/uL — AB (ref 150–400)
RBC: 5.55 MIL/uL — AB (ref 3.87–5.11)
RDW: 16.2 % — ABNORMAL HIGH (ref 11.5–15.5)
WBC: 7.7 10*3/uL (ref 4.0–10.5)

## 2017-02-18 LAB — HEPARIN LEVEL (UNFRACTIONATED): HEPARIN UNFRACTIONATED: 0.6 [IU]/mL (ref 0.30–0.70)

## 2017-02-18 MED ORDER — DILTIAZEM HCL 30 MG PO TABS
30.0000 mg | ORAL_TABLET | Freq: Four times a day (QID) | ORAL | Status: DC
  Administered 2017-02-18 – 2017-02-20 (×10): 30 mg via ORAL
  Filled 2017-02-18 (×11): qty 1

## 2017-02-18 MED ORDER — APIXABAN 2.5 MG PO TABS
2.5000 mg | ORAL_TABLET | Freq: Two times a day (BID) | ORAL | Status: DC
  Administered 2017-02-18 – 2017-02-20 (×6): 2.5 mg via ORAL
  Filled 2017-02-18 (×7): qty 1

## 2017-02-18 NOTE — Progress Notes (Signed)
Ref: Renford Dills, MD   Subjective:  Patient is awake and able to say her name and move all 4 extremities. Monitor shows atrial fibrillation with controlled ventricular response..  Objective:  Vital Signs in the last 24 hours: Temp:  [97.1 F (36.2 C)-98 F (36.7 C)] 97.1 F (36.2 C) (09/22 0900) Pulse Rate:  [36-83] 61 (09/22 0645) Cardiac Rhythm: Atrial flutter (09/22 0800) Resp:  [15-27] 20 (09/22 0645) BP: (70-213)/(15-90) 156/64 (09/22 0900) SpO2:  [100 %] 100 % (09/22 0645) Weight:  [50.6 kg (111 lb 8.8 oz)] 50.6 kg (111 lb 8.8 oz) (09/22 0404)  Physical Exam: BP Readings from Last 1 Encounters:  02/18/17 (!) 156/64    Wt Readings from Last 1 Encounters:  02/18/17 50.6 kg (111 lb 8.8 oz)    Weight change: 0.7 kg (1 lb 8.7 oz) Body mass index is 21.79 kg/m. HEENT: [place]/AT, Eyes-Blue, PERL, EOMI, Conjunctiva-Pink, Sclera-Non-icteric Neck: No JVD, No bruit, Trachea midline. Lungs:  Clear, Bilateral. Cardiac:  Irregular rhythm, normal S1 and S2, no S3. II/VI systolic murmur. Abdomen:  Soft, non-tender. BS present. Extremities:  No edema present. No cyanosis. No clubbing. CNS: AxOx2, Cranial nerves grossly intact, moves all 4 extremities.  Skin: Warm and dry.   Intake/Output from previous day: 09/21 0701 - 09/22 0700 In: 219.3 [I.V.:219.3] Out: 625 [Urine:625]    Lab Results: BMET    Component Value Date/Time   NA 140 02/18/2017 0513   NA 140 02/17/2017 1000   NA 139 02/16/2017 0836   K 4.1 02/18/2017 0513   K 3.9 02/17/2017 1000   K 4.3 02/16/2017 0836   CL 101 02/18/2017 0513   CL 102 02/17/2017 1000   CL 99 (L) 02/16/2017 0836   CO2 31 02/18/2017 0513   CO2 30 02/17/2017 1000   CO2 29 02/16/2017 0836   GLUCOSE 93 02/18/2017 0513   GLUCOSE 85 02/17/2017 1000   GLUCOSE 93 02/16/2017 0836   BUN 38 (H) 02/18/2017 0513   BUN 39 (H) 02/17/2017 1000   BUN 42 (H) 02/16/2017 0836   CREATININE 0.69 02/18/2017 0513   CREATININE 0.72 02/17/2017 1000   CREATININE 0.78 02/16/2017 0836   CALCIUM 9.1 02/18/2017 0513   CALCIUM 8.9 02/17/2017 1000   CALCIUM 9.1 02/16/2017 0836   GFRNONAA >60 02/18/2017 0513   GFRNONAA >60 02/17/2017 1000   GFRNONAA >60 02/16/2017 0836   GFRAA >60 02/18/2017 0513   GFRAA >60 02/17/2017 1000   GFRAA >60 02/16/2017 0836   CBC    Component Value Date/Time   WBC 7.7 02/18/2017 0513   RBC 5.55 (H) 02/18/2017 0513   HGB 14.6 02/18/2017 0513   HCT 47.6 (H) 02/18/2017 0513   PLT 118 (L) 02/18/2017 0513   MCV 85.8 02/18/2017 0513   MCH 26.3 02/18/2017 0513   MCHC 30.7 02/18/2017 0513   RDW 16.2 (H) 02/18/2017 0513   LYMPHSABS 1.2 02/15/2017 1357   MONOABS 1.2 (H) 02/15/2017 1357   EOSABS 0.2 02/15/2017 1357   BASOSABS 0.0 02/15/2017 1357   HEPATIC Function Panel  Recent Labs  02/16/17 0836 02/17/17 1000 02/18/17 0513  PROT 5.1* 4.9* 4.7*   HEMOGLOBIN A1C No components found for: HGA1C,  MPG CARDIAC ENZYMES Lab Results  Component Value Date   CKTOTAL 673 (H) 02/12/2016   TROPONINI 0.25 (HH) 02/15/2017   TROPONINI 0.26 (HH) 02/09/2017   TROPONINI 0.25 (HH) 02/09/2017   BNP No results for input(s): PROBNP in the last 8760 hours. TSH  Recent Labs  02/11/17 0502  TSH 2.076   CHOLESTEROL  Recent Labs  02/16/17 0357  CHOL 146    Scheduled Meds: .  stroke: mapping our early stages of recovery book   Does not apply Once  . apixaban  2.5 mg Oral BID  . atorvastatin  40 mg Oral q1800  . Chlorhexidine Gluconate Cloth  6 each Topical Daily  . feeding supplement  1 Container Oral BID BM  . feeding supplement (ENSURE ENLIVE)  237 mL Oral Q24H  . mouth rinse  15 mL Mouth Rinse BID  . multivitamin with minerals  1 tablet Oral Daily  . sodium chloride flush  10-40 mL Intracatheter Q12H   Continuous Infusions: . diltiazem (CARDIZEM) infusion 5 mg/hr (02/18/17 0100)   PRN Meds:.acetaminophen, hydrALAZINE, Influenza vac split quadrivalent PF, nitroGLYCERIN, ondansetron (ZOFRAN) IV,  RESOURCE THICKENUP CLEAR, sodium chloride flush  Assessment/Plan: Acute multiple embolii stroke Acute left heart systolic failure Persistent atrial fibrillation Left atrial thrombus/tumor Ischemic cardiomyopathy Right lung pneumonia Hypertension Left lower extremity severe peripheral vascular disease  Agree with rate control and Eliquis use.   LOS: 10 days    [name]  MD  02/18/2017, 12:23 PM

## 2017-02-18 NOTE — Progress Notes (Signed)
ANTICOAGULATION CONSULT NOTE - Initial Consult  Pharmacy Consult for Eliquis Indication: atrial fibrillation w/ LA thrombus  Allergies  Allergen Reactions  . Aspirin     nosebleed    Patient Measurements: Height: 5' (152.4 cm) Weight: 111 lb 8.8 oz (50.6 kg) IBW/kg (Calculated) : 45.5  Vital Signs: Temp: 97.9 F (36.6 C) (09/22 0404) Temp Source: Oral (09/22 0404) BP: 139/62 (09/22 0645) Pulse Rate: 61 (09/22 0645)  Labs:  Recent Labs  02/15/17 1357  02/16/17 0356 02/16/17 0836 02/16/17 1152  02/17/17 0110 02/17/17 1000 02/17/17 2205 02/18/17 0513  HGB 18.5*  --  19.2*  --   --   --   --  15.6*  --  14.6  HCT 56.7*  --  60.0*  --   --   --   --  50.9*  --  47.6*  PLT 111*  --  132*  --   --   --   --  124*  --  118*  APTT 24  < >  --  151* 142*  < > 85* 68* 35  --   LABPROT 15.8*  --   --   --  16.5*  --   --   --   --   --   INR 1.27  --   --   --  1.34  --   --   --   --   --   HEPARINUNFRC 1.70*  < >  --  1.70* 1.76*  --   --  0.94* 0.54 0.60  CREATININE 0.90  --   --  0.78  --   --   --  0.72  --  0.69  TROPONINI 0.25*  --   --   --   --   --   --   --   --   --   < > = values in this interval not displayed.  Estimated Creatinine Clearance: 37.6 mL/min (by C-G formula based on SCr of 0.69 mg/dL).   Medical History: Past Medical History:  Diagnosis Date  . Arthritis    OA  . Fracture of femoral neck (HCC) 02/11/2016   LEFT  . Hypertension     Medications:  Prescriptions Prior to Admission  Medication Sig Dispense Refill Last Dose  . acetaminophen (TYLENOL) 500 MG tablet Take 1 tablet (500 mg total) by mouth every 6 (six) hours as needed for mild pain. (Patient taking differently: Take 1,000 mg by mouth every 6 (six) hours as needed for mild pain, moderate pain, fever or headache. ) 30 tablet 0 02/08/2017 at Unknown time  . Chlorpheniramine-APAP (CORICIDIN) 2-325 MG TABS Take 1-2 tablets by mouth at bedtime as needed (for cough).   Past Month at Unknown  time  . cimetidine (TAGAMET) 200 MG tablet Take 200 mg by mouth daily as needed (for heartburn).   Past Week at Unknown time  . losartan (COZAAR) 100 MG tablet Take 100 mg by mouth daily.   Past Week at Unknown time  . metoprolol tartrate (LOPRESSOR) 25 MG tablet Take 25 mg by mouth 2 (two) times daily.   Past Week at unknown time   Scheduled:  .  stroke: mapping our early stages of recovery book   Does not apply Once  . atorvastatin  40 mg Oral q1800  . Chlorhexidine Gluconate Cloth  6 each Topical Daily  . feeding supplement  1 Container Oral BID BM  . feeding supplement (ENSURE ENLIVE)  237 mL Oral Q24H  .  mouth rinse  15 mL Mouth Rinse BID  . multivitamin with minerals  1 tablet Oral Daily  . sodium chloride flush  10-40 mL Intracatheter Q12H   Infusions:  . diltiazem (CARDIZEM) infusion 5 mg/hr (02/18/17 0100)  . heparin 550 Units/hr (02/18/17 0053)    Assessment: 83yo female w/ complex hospital stay: pt tx'd at [facility] for new-onset Afib and new CHF > ECG reveals EF 25% and small LA thrombus > code stroke called for new aphasia > transferred to [facility] and changed from Eliquis to UFH w/ low goal; now pt will be transitioned back to Eliquis for Afib; of note pt is >82yo and <60kg.   Plan:  Will resume Eliquis 2.5mg  PO BID (stop heparin when giving first dose) and begin anticoag education.  Vernard Gambles, PharmD, BCPS  02/18/2017,7:37 AM

## 2017-02-18 NOTE — Progress Notes (Signed)
STROKE TEAM PROGRESS NOTE   SUBJECTIVE (INTERVAL HISTORY) No family is at bedside. Patient lying in bed, still has aphasia, no acute event overnight. Passed swallow. Heparin discontinued, transition to by mouth eliquis.    OBJECTIVE Temp:  [97.5 F (36.4 C)-98 F (36.7 C)] 97.9 F (36.6 C) (09/22 0404) Pulse Rate:  [36-83] 61 (09/22 0645) Cardiac Rhythm: Atrial fibrillation (09/22 0700) Resp:  [15-27] 20 (09/22 0645) BP: (70-213)/(15-90) 139/62 (09/22 0645) SpO2:  [100 %] 100 % (09/22 0645) Weight:  [111 lb 8.8 oz (50.6 kg)] 111 lb 8.8 oz (50.6 kg) (09/22 0404)  CBC:  Recent Labs Lab 02/15/17 0416 02/15/17 1357  02/17/17 1000 02/18/17 0513  WBC 11.1* 11.6*  < > 7.9 7.7  NEUTROABS 8.1* 9.0*  --   --   --   HGB 19.1* 18.5*  < > 15.6* 14.6  HCT 57.2* 56.7*  < > 50.9* 47.6*  MCV 82.8 83.0  < > 85.1 85.8  PLT 146* 111*  < > 124* 118*  < > = values in this interval not displayed.  Basic Metabolic Panel:  Recent Labs Lab 02/14/17 0448 02/15/17 0416  02/17/17 1000 02/18/17 0513  NA 139 140  < > 140 140  K 4.0 4.0  < > 3.9 4.1  CL 100* 99*  < > 102 101  CO2 29 30  < > 30 31  GLUCOSE 87 104*  < > 85 93  BUN 48* 47*  < > 39* 38*  CREATININE 0.82 0.76  < > 0.72 0.69  CALCIUM 8.6* 9.0  < > 8.9 9.1  MG 1.7 2.1  --   --   --   PHOS 2.2* 2.3*  --   --   --   < > = values in this interval not displayed.  Lipid Panel:     Component Value Date/Time   CHOL 146 02/16/2017 0357   TRIG 85 02/16/2017 0357   HDL 46 02/16/2017 0357   CHOLHDL 3.2 02/16/2017 0357   VLDL 17 02/16/2017 0357   LDLCALC 83 02/16/2017 0357   HgbA1c:  Lab Results  Component Value Date   HGBA1C 5.8 (H) 02/16/2017   Urine Drug Screen: No results found for: LABOPIA, COCAINSCRNUR, LABBENZ, AMPHETMU, THCU, LABBARB  Alcohol Level No results found for: ETH  IMAGING I have personally reviewed the radiological images below and agree with the radiology interpretations.  CT Head Code Stroke Without  Contrast 02/15/2017 1. Atrophy and small vessel disease. No acute cortical infarction or large vessel occlusion is evident. 2. ASPECTS is 10.  CT Angio Head and Neck 02/15/2017 1. Negative for emergent large vessel occlusion, normal for age anterior circulation, and negative for arterial stenosis in the neck. 2. There is a mild to moderate stenosis of the right PCA distal P1 segment. Otherwise the posterior circulation is negative. 3. Stable CT appearance of the brain since 1016 hours today. 4. Bilateral layering pleural effusions, moderate or large on the right.  Mr Brain Wo Contrast 02/15/2017 IMPRESSION:  Widespread multifocal areas of acute infarction, nonhemorrhagic, throughout both cerebral hemispheres, as well as the RIGHT cerebellar hemisphere, most consistent with a shower of emboli. The dominant hemispheric involvement is within the LEFT ICA/MCA territory.   Dg Chest Port 1 View 02/16/2017 IMPRESSION:  Interval placement of left-sided PICC line with distal tip in expected position of SVC. Stable cardiomegaly and central pulmonary vascular congestion, with probable bibasilar edema or atelectasis. Mild bilateral pleural effusions are noted.   Transthoracic Echocardiogram  Study Conclusions - Left ventricle: The cavity size was normal. There was mild   concentric hypertrophy. Systolic function was severely reduced.   The estimated ejection fraction was in the range of 25% to 30%.   There is severe hypokinesis of the anteroseptal and anterior   myocardium. There is moderate hypokinesis of the lateral and   inferior myocardium. - Aortic valve: There was mild to moderate regurgitation. - Mitral valve: Calcified annulus. Mildly thickened, mildly   calcified leaflets . There was mild regurgitation. - Left atrium: The atrium was moderately dilated. There was an   apparent, small, 2.4 cm (L) x 1.8 cm (W), regular, solid, fixed   mass in the appendage; the appearance is consistent  with   thrombus; based on its appearance, tumor cannot be excluded. - Right ventricle: The cavity size was mildly dilated. Wall   thickness was normal. Systolic function was mildly reduced. - Right atrium: The atrium was severely dilated. - Tricuspid valve: There was severe regurgitation. - Pulmonary arteries: Systolic pressure was moderately increased.   PA peak pressure: 60 mm Hg (S). - Pericardium, extracardiac: A small, partially loculated   pericardial effusion was identified.  Bilateral US ABI 02/13/2017 Summary: - Right - ABIs and Doppler waveforms indicate normal arterial flow   at rest. - Left - ABIs and Doppler waveforms indicate a severe reduction in   arterial flow at rest.11/2016    PHYSICAL EXAM Vitals:   02/18/17 0404 02/18/17 0500 02/18/17 0600 02/18/17 0645  BP: (!) 139/42 (!) 168/55 (!) 161/62 139/62  Pulse: 71 69 (!) 36 61  Resp: Temp: 97.9 F (36.6 C)     TempSrc: Oral     SpO2: 100% 100% 100% 100%  Weight: 111 lb 8.8 oz (50.6 kg)     Height:       Frail cachectic malnourished appearing elderly caucasian lady. She has pigeon breasted chest. . Afebrile. Head is nontraumatic. Neck is supple without bruit. Cardiac exam no murmur or gallop. Lungs are clear to auscultation. Distal pulses are well felt. Neurological Exam :  Awake alert. Dysarthria and fluent aphasia and difficulty understanding. Can follow only simple midline and one-step commands. Unable to name or repeat or read. Diminished attention, registration and recall. Extraocular moments are full range without nystagmus. Blinks to threat bilaterally. Fundi were not visualized. Mild right lower facial asymmetry. Motor system exam able to move both upper and lower extremity against gravity without definite left but mild weakness of right grip and intrinsic hand muscles. Lower extremity strength seems symmetric. Not cooperative for testing coordination and sensation reliably. Deep tendon pulses are  symmetric. Plantars are both downgoing. Gait was not tested.  ASSESSMENT/PLAN Rachel [name] is a 83 y.o. female with history of hypertension, history of atrial fibrillation (not anticoagulated), previous left femoral fracture in 2017, and arthritis  presenting with generalized weakness, aphasia, and left gaze preference. She did not receive IV t-PA due to Late presentation.  Strokes:  Multifocal infarcts including right cerebellum, left MCA, bilateral PCA, bilateral MCA/ACA - embolic  from A. fib, low EF and LAA thrombus .  Resultant  aphasia  CT head - Atrophy and small vessel disease. No acute cortical infarction or large vessel occlusion is evident.  CTA H&N - no high-grade stenosis   MRI head - acute infarcts, throughout both cerebral hemispheres and Rt cerebellar hemisphere, primary focus in left ICA/MCA territory  2D Echo - EF 25-30%. There was an apparent, small, 2.4  cm (L) x 1.8 cm (W), regular, solid, fixed mass in the appendage; the appearance is consistent with thrombus  LDL - 83   HgbA1c - 5.8  VTE prophylaxis - IV Heparin and Eliquis DIET DYS 2 Room service appropriate? Yes; Fluid consistency: Nectar Thick  No antithrombotic prior to admission, was on IV heparin, and now on Eliquis (apixaban) 2.5 mg bid after passing swallow.  Patient counseled to be compliant with her antithrombotic medications  Ongoing aggressive stroke risk factor management  Therapy recommendations: SNF recommended   Disposition: Pending  Cardiomyopathy Chronic atrial fibrillation LAA thrombus  Likely the etiology of current stroke  EF 25-30%  Was on IV heparin, no transition to eliquis.   Hypertension  Stable  Permissive hypertension (OK if < 180/105) but gradually normalize in 5-7 days  Long-term BP goal normotensive  Hyperlipidemia  Home meds: No lipid lowering medications prior to admission  LDL 83, goal < 70  Now on Lipitor 40 mg daily   Continue statin at  discharge  Other Stroke Risk Factors  Advanced age  Other Active Problems  Mild thrombocytopenia   Mild bilateral pleural effusions  Hospital day # 10  Neurology will sign off. Please call with questions. Pt will follow up with Darrol Angel, NP, at [facility] in about 6 weeks. Thanks for the consult.  Marvel Plan, MD PhD Stroke Neurology 02/18/2017 5:21 PM  To contact Stroke Continuity provider, please refer to [email or web address]. After hours, contact General Neurology

## 2017-02-18 NOTE — Progress Notes (Addendum)
PROGRESS NOTE    Rachel [name]  QMG:[id number] DOB: 1930/12/22 DOA: 02/08/2017 PCP: Renford Dills, MD   Chief Complaint  Patient presents with  . generalized weakness  . poor po intake  . non compliant with medications    Brief Narrative:  HPI on 9/12/218 by Dr. Marcial Pacas Opyd Rachel [name] is a 83 y.o. female with medical history significant for hypertension, osteoarthritis, and history of cardiac arrhythmia, now presenting to the emergency department for evaluation of generalized weakness and malaise. Patient was accompanied by her caretaker, with whom she lives, reporting that the patient has been experiencing increasing generalized weakness over the past couple days, much worse today. Patient was seated on the toilet, but was too weak to get up, prompting the caretaker to call EMS for evaluation. Patient required significant assistance just to stand and was brought into the ED for evaluation. She denies any recent fevers or chills, acknowledges some increased dyspnea and a slight cough, but denies any chest pain or palpitations. She also denies headache, change in vision or hearing, or focal numbness or weakness.   Interim history Patient was measured for new onset atrial fibrillation and was placed on Cardizem gtt. She is also found to be in volume overload with BNP of 3637, new heart failure and placed on IV Lasix. Patient underwent echocardiogram found to have an EF of 25% with severe hypokinesis. Patient also has a small thrombus in the left atrium. Cardiology was consulted and has signed off. On 02/15/2017 patient developed new A fascia and code stroke was initiated. Neurology was consulted. Patient was transferred to [facility] for neurology stroke team consult as well as further workup. Patient currently on heparin, will transition to Eliquis.  Assessment & Plan   Acute CVA with significant aphasia -First noted by caregivers on 02/14/2017, patient was seen by [name] who  explained the physician at that time that things were not normal. -On exam patient was noted to have aphasia with left gaze preference. -Code stroke was initiated on the morning of 02/15/2017. -Thought to be secondary to LA thrombus. -Neurology consultation appreciated -CT head: No acute cortical infarction or large vessel occlusion evident -MRI brain: Widespread multifocal areas of acute infarction, nonhemorrhagic, throughout both cerebral hemispheres, as well as right cerebral hemisphere is consistent with shower of emboli. -CTA head and neck negative for emergent large vessel occlusion, normal for age anterior circulation, negative for arterial stenosis of the neck. Mild to moderate stenosis of the right PCA distal P1 segment. -speech now recommending dysphagia 2 diet -PT/OT recommended SNF -Discussed with neurology, [name], when patient is able to pass her swallow evaluation, with transition to Eliquis -will transition patient to Eliquis today  New onset age fibrillation with RVR -Currently on heparin and diltiazem gtt.  -CHADSVASC at least 40 (age, gender, CHF, Stroke) -As above, will transition to Eliquis today and will wean off of cardizem drip to PO -will start cardizem at 30mg  q6hr  Leukocytosis -Resolved, possibly secondary to CVA versus new onset atrial fibrillation versus infection  Acute systolic and diastolic heart failure -BNP on admission 3637 -Echocardiogram EF of 25-30%, severe hypokinesis of the anteroseptal and anterior myocardium. Left atrium with small 2.4 cm x 1.8 cm irregular solid fixed mass in the appendage consistent with thrombus -Cardiology consulted and appreciated, has since signed off -Patient was placed on IV Lasix and losartan- which have been held -Continue to monitor intake and output, daily weights  Elevated troponin -Likely demand ischemia secondary to  atrial fibrillation versus CHF versus stroke -Patient currently not a candidate for heart  catheterization -Currently on heparin- transitioning to Eliquis  Pleural effusion -CTA head and neck showed bilateral layering pleural effusions, moderate or large on the right  Right upper lobar pneumonia -Blood cultures show no growth to date -Patient completed antibiotic therapy course during hospitalization  Acute kidney injury on chronic kidney disease, stage III -Resolved -Creatinine on mission is 1.23, currently 0.69 -Continue to monitor BMP  Peripheral vascular disease -Left ABI and Doppler waveforms indicate severe reduction in arterial flow or rest  Moderate malnutrition -nutrition consulted and recommended nutritional supplements  Urinary retention -Continue foley catheter  DVT Prophylaxis  Heparin  Code Status: DNR  Family Communication: None at bedside. Spoke with Niece via phone  Disposition Plan: Admitted, continue to monitor in stepdown. Will transition to Eliquis and PO cardizme  Consultants Neurology Cardiology  Procedures  Echocardiogram ABI  Antibiotics   Anti-infectives    Start     Dose/Rate Route Frequency Ordered Stop   02/11/17 1700  cefUROXime (CEFTIN) tablet 500 mg     500 mg Oral 2 times daily with meals 02/11/17 1139 02/13/17 1916   02/10/17 2200  azithromycin (ZITHROMAX) tablet 500 mg  Status:  Discontinued     500 mg Oral Daily at bedtime 02/10/17 0826 02/11/17 1139   02/09/17 2200  cefTRIAXone (ROCEPHIN) 1 g in dextrose 5 % 50 mL IVPB  Status:  Discontinued     1 g 100 mL/hr over 30 Minutes Intravenous Daily at bedtime 02/08/17 2243 02/11/17 1139   02/09/17 2200  azithromycin (ZITHROMAX) 500 mg in dextrose 5 % 250 mL IVPB  Status:  Discontinued     500 mg 250 mL/hr over 60 Minutes Intravenous Every 24 hours 02/08/17 2243 02/10/17 0826   02/08/17 2230  cefTRIAXone (ROCEPHIN) 1 g in dextrose 5 % 50 mL IVPB     1 g 100 mL/hr over 30 Minutes Intravenous  Once 02/08/17 2227 02/09/17 0123   02/08/17 2230  azithromycin (ZITHROMAX) 500  mg in dextrose 5 % 250 mL IVPB     500 mg 250 mL/hr over 60 Minutes Intravenous  Once 02/08/17 2227 02/09/17 1610      Subjective:   Rachel [name] seen and examined today.  Patient currently aphasic and difficult to assess.    Objective:   Vitals:   02/18/17 0500 02/18/17 0600 02/18/17 0645 02/18/17 0900  BP: (!) 168/55 (!) 161/62 139/62 (!) 156/64  Pulse: 69 (!) 36 61   Resp: Temp:    (!) 97.1 F (36.2 C)  TempSrc:    Axillary  SpO2: 100% 100% 100%   Weight:      Height:        Intake/Output Summary (Last 24 hours) at 02/18/17 1256 Last data filed at 02/18/17 1000  Gross per 24 hour  Intake              238 ml  Output              250 ml  Net              -12 ml   Filed Weights   02/16/17 0434 02/17/17 0413 02/18/17 0404  Weight: 50.9 kg (112 lb 3.4 oz) 49.9 kg (110 lb 0.2 oz) 50.6 kg (111 lb 8.8 oz)   Exam  General: Well developed, well nourished, NAD, appears stated age  HEENT: NCAT, PERRLA, EOMI, Anicteic Sclera, mucous membranes moist.  Neck: Supple, no JVD, no masses  Cardiovascular: S1 S2 auscultated, 2/6 SEM  Respiratory: Clear to auscultation bilaterally  Abdomen: Soft, nontender, nondistended, + bowel sounds  Extremities: warm dry without cyanosis clubbing or edema  Neuro: Awake and alert, cannot assess, dysarthria  Data Reviewed: I have personally reviewed following labs and imaging studies  CBC:  Recent Labs Lab 02/15/17 0416 02/15/17 1357 02/16/17 0356 02/17/17 1000 02/18/17 0513  WBC 11.1* 11.6* 9.2 7.9 7.7  NEUTROABS 8.1* 9.0*  --   --   --   HGB 19.1* 18.5* 19.2* 15.6* 14.6  HCT 57.2* 56.7* 60.0* 50.9* 47.6*  MCV 82.8 83.0 85.3 85.1 85.8  PLT 146* 111* 132* 124* 118*   Basic Metabolic Panel:  Recent Labs Lab 02/12/17 0442 02/13/17 0500 02/14/17 0448 02/15/17 0416 02/15/17 1357 02/16/17 0836 02/17/17 1000 02/18/17 0513  NA 142 138 139 140 138 139 140 140  K 3.1* 4.2 4.0 4.0 3.8 4.3 3.9 4.1  CL 100* 99*  100* 99* 97* 99* 102 101  CO2 32 GLUCOSE 103* 92 87 104* 101* 93 85 93  BUN 40* 41* 48* 47* 44* 42* 39* 38*  CREATININE 0.99 0.79 0.82 0.76 0.90 0.78 0.72 0.69  CALCIUM 9.0 8.7* 8.6* 9.0 9.0 9.1 8.9 9.1  MG 1.6* 1.7 1.7 2.1  --   --   --   --   PHOS 2.5 2.4* 2.2* 2.3*  --   --   --   --    GFR: Estimated Creatinine Clearance: 37.6 mL/min (by C-G formula based on SCr of 0.69 mg/dL). Liver Function Tests:  Recent Labs Lab 02/15/17 1357 02/16/17 0836 02/17/17 1000 02/18/17 0513  AST 34 35 33 29  ALT ALKPHOS 70 61 53 54  BILITOT 1.3* 1.1 1.1 0.7  PROT 5.8* 5.1* 4.9* 4.7*  ALBUMIN 3.3* 2.9* 2.7* 2.6*   No results for input(s): LIPASE, AMYLASE in the last 168 hours. No results for input(s): AMMONIA in the last 168 hours. Coagulation Profile:  Recent Labs Lab 02/15/17 1357 02/16/17 1152  INR 1.27 1.34   Cardiac Enzymes:  Recent Labs Lab 02/15/17 1357  TROPONINI 0.25*   BNP (last 3 results) No results for input(s): PROBNP in the last 8760 hours. HbA1C:  Recent Labs  02/16/17 0357  HGBA1C 5.8*   CBG: No results for input(s): GLUCAP in the last 168 hours. Lipid Profile:  Recent Labs  02/16/17 0357  CHOL 146  HDL 46  LDLCALC 83  TRIG 85  CHOLHDL 3.2   Thyroid Function Tests: No results for input(s): TSH, T4TOTAL, FREET4, T3FREE, THYROIDAB in the last 72 hours. Anemia Panel: No results for input(s): VITAMINB12, FOLATE, FERRITIN, TIBC, IRON, RETICCTPCT in the last 72 hours. Urine analysis:    Component Value Date/Time   COLORURINE YELLOW 02/09/2017 0545   APPEARANCEUR CLEAR 02/09/2017 0545   LABSPEC 1.011 02/09/2017 0545   PHURINE 5.0 02/09/2017 0545   GLUCOSEU NEGATIVE 02/09/2017 0545   HGBUR SMALL (A) 02/09/2017 0545   BILIRUBINUR NEGATIVE 02/09/2017 0545   KETONESUR NEGATIVE 02/09/2017 0545   PROTEINUR 100 (A) 02/09/2017 0545   NITRITE NEGATIVE 02/09/2017 0545   LEUKOCYTESUR TRACE (A) 02/09/2017 0545   Sepsis  Labs: (procalcitonin:4,lacticidven:4)  ) Recent Results (from the past 240 hour(s))  Culture, blood (routine x 2)     Status: None   Collection Time: 02/08/17 10:34 PM  Result Value Ref Range Status   Specimen Description BLOOD  RIGHT WRIST  Final   Special Requests   Final    BOTTLES DRAWN AEROBIC ONLY Blood Culture adequate volume   Culture   Final    NO GROWTH 5 DAYS Performed at [facility], [address], [place], [place] [address]    Report Status 02/14/2017 FINAL  Final  MRSA PCR Screening     Status: None   Collection Time: 02/09/17 12:09 AM  Result Value Ref Range Status   MRSA by PCR NEGATIVE NEGATIVE Final    Comment:        The GeneXpert MRSA Assay (FDA approved for NASAL specimens only), is one component of a comprehensive MRSA colonization surveillance program. It is not intended to diagnose MRSA infection nor to guide or monitor treatment for MRSA infections.   Culture, blood (routine x 2)     Status: None   Collection Time: 02/09/17 12:44 AM  Result Value Ref Range Status   Specimen Description BLOOD LEFT HAND  Final   Special Requests IN PEDIATRIC BOTTLE Blood Culture adequate volume  Final   Culture   Final    NO GROWTH 5 DAYS Performed at [facility], [address], [place], [place] [address]    Report Status 02/14/2017 FINAL  Final      Radiology Studies: Dg Chest Port 1 View  Result Date: 02/16/2017 CLINICAL DATA:  PICC placement. EXAM: PORTABLE CHEST 1 VIEW COMPARISON:  Radiographs of February 16, 2017. FINDINGS: Stable cardiomegaly and central pulmonary vascular congestion. Atherosclerosis of thoracic aorta is noted. Mild bibasilar edema or atelectasis is noted. Mild bilateral pleural effusions are noted. No pneumothorax is noted. Bony thorax is unremarkable. Interval placement of left-sided PICC line is noted with distal tip in expected position of the SVC. IMPRESSION: Interval placement of left-sided PICC line with  distal tip in expected position of SVC. Stable cardiomegaly and central pulmonary vascular congestion, with probable bibasilar edema or atelectasis. Mild bilateral pleural effusions are noted. Electronically Signed   By: Lupita Raider, M.D.   On: 02/16/2017 14:52     Scheduled Meds: .  stroke: mapping our early stages of recovery book   Does not apply Once  . apixaban  2.5 mg Oral BID  . atorvastatin  40 mg Oral q1800  . Chlorhexidine Gluconate Cloth  6 each Topical Daily  . diltiazem  30 mg Oral Q6H  . feeding supplement  1 Container Oral BID BM  . feeding supplement (ENSURE ENLIVE)  237 mL Oral Q24H  . mouth rinse  15 mL Mouth Rinse BID  . multivitamin with minerals  1 tablet Oral Daily  . sodium chloride flush  10-40 mL Intracatheter Q12H   Continuous Infusions:    LOS: 10 days   Time Spent in minutes   30 minutes  Aidenjames Heckmann D.O. on 02/18/2017 at 12:56 PM  Between 7am to 7pm - Pager - [phone]  After 7pm go to [email or web address] - password [email or web address]  And look for the night coverage person covering for me after hours  [facility] Office  [phone]

## 2017-02-19 NOTE — Plan of Care (Signed)
Problem: Education: Goal: Knowledge of [facility] Education information/materials will improve Outcome: Progressing Discussed with patient plan of care for the evening, pain management and NIH stroke evaluation with some teach back displayed

## 2017-02-19 NOTE — Progress Notes (Signed)
PROGRESS NOTE    Rachel [name]  ZOX:[id number] DOB: 04-17-1931 DOA: 02/08/2017 PCP: Renford Dills, MD   Chief Complaint  Patient presents with  . generalized weakness  . poor po intake  . non compliant with medications    Brief Narrative:  HPI on 9/12/218 by Dr. Marcial Pacas Opyd Rachel [name] is a 83 y.o. female with medical history significant for hypertension, osteoarthritis, and history of cardiac arrhythmia, now presenting to the emergency department for evaluation of generalized weakness and malaise. Patient was accompanied by her caretaker, with whom she lives, reporting that the patient has been experiencing increasing generalized weakness over the past couple days, much worse today. Patient was seated on the toilet, but was too weak to get up, prompting the caretaker to call EMS for evaluation. Patient required significant assistance just to stand and was brought into the ED for evaluation. She denies any recent fevers or chills, acknowledges some increased dyspnea and a slight cough, but denies any chest pain or palpitations. She also denies headache, change in vision or hearing, or focal numbness or weakness.   Interim history Patient was measured for new onset atrial fibrillation and was placed on Cardizem gtt. She is also found to be in volume overload with BNP of 3637, new heart failure and placed on IV Lasix. Patient underwent echocardiogram found to have an EF of 25% with severe hypokinesis. Patient also has a small thrombus in the left atrium. Cardiology was consulted and has signed off. On 02/15/2017 patient developed new A fascia and code stroke was initiated. Neurology was consulted. Patient was transferred to [facility] for neurology stroke team consult as well as further workup. Patient currently on heparin, transitioned to Eliquis.  Assessment & Plan   Acute CVA with significant aphasia -First noted by caregivers on 02/14/2017, patient was seen by [name] who explained  the physician at that time that things were not normal. -On exam patient was noted to have aphasia with left gaze preference. -Code stroke was initiated on the morning of 02/15/2017. -Thought to be secondary to LA thrombus. -Neurology consultation appreciated -CT head: No acute cortical infarction or large vessel occlusion evident -MRI brain: Widespread multifocal areas of acute infarction, nonhemorrhagic, throughout both cerebral hemispheres, as well as right cerebral hemisphere is consistent with shower of emboli. -CTA head and neck negative for emergent large vessel occlusion, normal for age anterior circulation, negative for arterial stenosis of the neck. Mild to moderate stenosis of the right PCA distal P1 segment. -speech now recommending dysphagia 2 diet -PT/OT recommended SNF -Discussed with neurology, [name], when patient is able to pass her swallow evaluation, with transition to Eliquis -transitioned to Eliquis  New onset age fibrillation with RVR -Discontinued heparin and diltiazem drips -CHADSVASC at least 54 (age, gender, CHF, Stroke) -Transitioned to Eliquis -Transitioned to cardizem  q6 hr- currently rate controlled  Leukocytosis -Resolved, possibly secondary to CVA versus new onset atrial fibrillation versus infection  Acute systolic and diastolic heart failure -BNP on admission 3637 -Echocardiogram EF of 25-30%, severe hypokinesis of the anteroseptal and anterior myocardium. Left atrium with small 2.4 cm x 1.8 cm irregular solid fixed mass in the appendage consistent with thrombus -Cardiology consulted and appreciated, has since signed off -Patient was placed on IV Lasix and losartan- which have been held -Continue to monitor intake and output, daily weights  Elevated troponin -Likely demand ischemia secondary to atrial fibrillation versus CHF versus stroke -Patient currently not a candidate for heart catheterization -Currently on  heparin- transitioning to  Eliquis  Pleural effusion -CTA head and neck showed bilateral layering pleural effusions, moderate or large on the right  Right upper lobar pneumonia -Blood cultures show no growth to date -Patient completed antibiotic therapy course during hospitalization  Acute kidney injury on chronic kidney disease, stage III -Resolved -Creatinine on mission is 1.23, currently 0.69 -Continue to monitor BMP  Peripheral vascular disease -Left ABI and Doppler waveforms indicate severe reduction in arterial flow or rest  Moderate malnutrition -nutrition consulted and recommended nutritional supplements  Urinary retention -Continue foley catheter  DVT Prophylaxis  Eliquis  Code Status: DNR  Family Communication: None at bedside.   Disposition Plan: Admitted, Will transfer to tele floor. Will likely needs SNF  Consultants Neurology Cardiology  Procedures  Echocardiogram ABI  Antibiotics   Anti-infectives    Start     Dose/Rate Route Frequency Ordered Stop   02/11/17 1700  cefUROXime (CEFTIN) tablet 500 mg     500 mg Oral 2 times daily with meals 02/11/17 1139 02/13/17 1916   02/10/17 2200  azithromycin (ZITHROMAX) tablet 500 mg  Status:  Discontinued     500 mg Oral Daily at bedtime 02/10/17 0826 02/11/17 1139   02/09/17 2200  cefTRIAXone (ROCEPHIN) 1 g in dextrose 5 % 50 mL IVPB  Status:  Discontinued     1 g 100 mL/hr over 30 Minutes Intravenous Daily at bedtime 02/08/17 2243 02/11/17 1139   02/09/17 2200  azithromycin (ZITHROMAX) 500 mg in dextrose 5 % 250 mL IVPB  Status:  Discontinued     500 mg 250 mL/hr over 60 Minutes Intravenous Every 24 hours 02/08/17 2243 02/10/17 0826   02/08/17 2230  cefTRIAXone (ROCEPHIN) 1 g in dextrose 5 % 50 mL IVPB     1 g 100 mL/hr over 30 Minutes Intravenous  Once 02/08/17 2227 02/09/17 0123   02/08/17 2230  azithromycin (ZITHROMAX) 500 mg in dextrose 5 % 250 mL IVPB     500 mg 250 mL/hr over 60 Minutes Intravenous  Once 02/08/17 2227  02/09/17 4540      Subjective:   Rachel [name] seen and examined today.  Patient currently aphasic. Can answer simple questions.  Objective:   Vitals:   02/19/17 0500 02/19/17 0600 02/19/17 0700 02/19/17 0756  BP: (!) 110/91 (!) 107/55 111/74 (!) 131/51  Pulse: 72 76 78 69  Resp: (!) Temp:    97.9 F (36.6 C)  TempSrc:    Oral  SpO2: 99% 100% 100% 94%  Weight:      Height:        Intake/Output Summary (Last 24 hours) at 02/19/17 0834 Last data filed at 02/19/17 0600  Gross per 24 hour  Intake            912.5 ml  Output              625 ml  Net            287.5 ml   Filed Weights   02/17/17 0413 02/18/17 0404 02/19/17 0320  Weight: 49.9 kg (110 lb 0.2 oz) 50.6 kg (111 lb 8.8 oz) 51.5 kg (113 lb 8.6 oz)   Exam  General: Well developed, well nourished, NAD, appears stated age  HEENT: NCAT, mucous membranes moist.   Cardiovascular: S1 S2 auscultated, irregular, 2/6 SEM  Respiratory: Clear to auscultation bilaterally with equal chest rise  Abdomen: Soft, nontender, nondistended, + bowel sounds  Extremities: warm dry without cyanosis clubbing or  edema  Neuro: Awake and alert, orientation difficult to assess due to dysarthria  Psych: Normal affect and demeanor  Data Reviewed: I have personally reviewed following labs and imaging studies  CBC:  Recent Labs Lab 02/15/17 0416 02/15/17 1357 02/16/17 0356 02/17/17 1000 02/18/17 0513  WBC 11.1* 11.6* 9.2 7.9 7.7  NEUTROABS 8.1* 9.0*  --   --   --   HGB 19.1* 18.5* 19.2* 15.6* 14.6  HCT 57.2* 56.7* 60.0* 50.9* 47.6*  MCV 82.8 83.0 85.3 85.1 85.8  PLT 146* 111* 132* 124* 118*   Basic Metabolic Panel:  Recent Labs Lab 02/13/17 0500 02/14/17 0448 02/15/17 0416 02/15/17 1357 02/16/17 0836 02/17/17 1000 02/18/17 0513  NA 138 139 140 138 139 140 140  K 4.2 4.0 4.0 3.8 4.3 3.9 4.1  CL 99* 100* 99* 97* 99* 102 101  CO2 GLUCOSE 92 87 104* 101* 93 85 93  BUN 41* 48* 47*  44* 42* 39* 38*  CREATININE 0.79 0.82 0.76 0.90 0.78 0.72 0.69  CALCIUM 8.7* 8.6* 9.0 9.0 9.1 8.9 9.1  MG 1.7 1.7 2.1  --   --   --   --   PHOS 2.4* 2.2* 2.3*  --   --   --   --    GFR: Estimated Creatinine Clearance: 37.6 mL/min (by C-G formula based on SCr of 0.69 mg/dL). Liver Function Tests:  Recent Labs Lab 02/15/17 1357 02/16/17 0836 02/17/17 1000 02/18/17 0513  AST 34 35 33 29  ALT ALKPHOS 70 61 53 54  BILITOT 1.3* 1.1 1.1 0.7  PROT 5.8* 5.1* 4.9* 4.7*  ALBUMIN 3.3* 2.9* 2.7* 2.6*   No results for input(s): LIPASE, AMYLASE in the last 168 hours. No results for input(s): AMMONIA in the last 168 hours. Coagulation Profile:  Recent Labs Lab 02/15/17 1357 02/16/17 1152  INR 1.27 1.34   Cardiac Enzymes:  Recent Labs Lab 02/15/17 1357  TROPONINI 0.25*   BNP (last 3 results) No results for input(s): PROBNP in the last 8760 hours. HbA1C: No results for input(s): HGBA1C in the last 72 hours. CBG: No results for input(s): GLUCAP in the last 168 hours. Lipid Profile: No results for input(s): CHOL, HDL, LDLCALC, TRIG, CHOLHDL, LDLDIRECT in the last 72 hours. Thyroid Function Tests: No results for input(s): TSH, T4TOTAL, FREET4, T3FREE, THYROIDAB in the last 72 hours. Anemia Panel: No results for input(s): VITAMINB12, FOLATE, FERRITIN, TIBC, IRON, RETICCTPCT in the last 72 hours. Urine analysis:    Component Value Date/Time   COLORURINE YELLOW 02/09/2017 0545   APPEARANCEUR CLEAR 02/09/2017 0545   LABSPEC 1.011 02/09/2017 0545   PHURINE 5.0 02/09/2017 0545   GLUCOSEU NEGATIVE 02/09/2017 0545   HGBUR SMALL (A) 02/09/2017 0545   BILIRUBINUR NEGATIVE 02/09/2017 0545   KETONESUR NEGATIVE 02/09/2017 0545   PROTEINUR 100 (A) 02/09/2017 0545   NITRITE NEGATIVE 02/09/2017 0545   LEUKOCYTESUR TRACE (A) 02/09/2017 0545   Sepsis Labs: (procalcitonin:4,lacticidven:4)  ) No results found for this or any previous visit (from the past 240  hour(s)).    Radiology Studies: No results found.   Scheduled Meds: .  stroke: mapping our early stages of recovery book   Does not apply Once  . apixaban  2.5 mg Oral BID  . atorvastatin  40 mg Oral q1800  . Chlorhexidine Gluconate Cloth  6 each Topical Daily  . diltiazem  30 mg Oral Q6H  . feeding supplement  1 Container  Oral BID BM  . feeding supplement (ENSURE ENLIVE)  237 mL Oral Q24H  . mouth rinse  15 mL Mouth Rinse BID  . multivitamin with minerals  1 tablet Oral Daily  . sodium chloride flush  10-40 mL Intracatheter Q12H   Continuous Infusions:    LOS: 11 days   Time Spent in minutes   30 minutes  Horacio Werth D.O. on 02/19/2017 at 8:34 AM  Between 7am to 7pm - Pager - [phone]  After 7pm go to [email or web address] - password [email or web address]  And look for the night coverage person covering for me after hours  [facility] Office  [phone]

## 2017-02-19 NOTE — Progress Notes (Signed)
Ref: Rachel Dills, MD   Subjective:  Awake and alert. Mild respiratory distress during sponge bath in bed. Monitor-A. Fib with controlled ventricular response.  Objective:  Vital Signs in the last 24 hours: Temp:  [97.4 F (36.3 C)-98 F (36.7 C)] 97.9 F (36.6 C) (09/23 0756) Pulse Rate:  [45-84] 69 (09/23 0756) Cardiac Rhythm: Atrial fibrillation (09/23 0804) Resp:  [17-33] 17 (09/23 0756) BP: (107-202)/(51-99) 131/51 (09/23 0756) SpO2:  [94 %-100 %] 94 % (09/23 0756) Weight:  [51.5 kg (113 lb 8.6 oz)] 51.5 kg (113 lb 8.6 oz) (09/23 0320)  Physical Exam: BP Readings from Last 1 Encounters:  02/19/17 (!) 131/51    Wt Readings from Last 1 Encounters:  02/19/17 51.5 kg (113 lb 8.6 oz)    Weight change: 0.9 kg (1 lb 15.8 oz) Body mass index is 22.17 kg/m. HEENT: [place]/AT, Eyes-Blue, PERL, EOMI, Conjunctiva-Pink, Sclera-Non-icteric Neck: No JVD, No bruit, Trachea midline. Lungs:  Clear, Bilateral. Cardiac:  Irregular rhythm, normal S1 and S2, no S3. II/VI systolic murmur. Abdomen:  Soft, non-tender. BS present. Extremities:  No edema present. No cyanosis. No clubbing. CNS: AxOx2, Cranial nerves grossly intact, moves all 4 extremities.  Skin: Warm and dry.   Intake/Output from previous day: 09/22 0701 - 09/23 0700 In: 912.5 [P.O.:595; I.V.:317.5] Out: 625 [Urine:625]    Lab Results: BMET    Component Value Date/Time   NA 140 02/18/2017 0513   NA 140 02/17/2017 1000   NA 139 02/16/2017 0836   K 4.1 02/18/2017 0513   K 3.9 02/17/2017 1000   K 4.3 02/16/2017 0836   CL 101 02/18/2017 0513   CL 102 02/17/2017 1000   CL 99 (L) 02/16/2017 0836   CO2 31 02/18/2017 0513   CO2 30 02/17/2017 1000   CO2 29 02/16/2017 0836   GLUCOSE 93 02/18/2017 0513   GLUCOSE 85 02/17/2017 1000   GLUCOSE 93 02/16/2017 0836   BUN 38 (H) 02/18/2017 0513   BUN 39 (H) 02/17/2017 1000   BUN 42 (H) 02/16/2017 0836   CREATININE 0.69 02/18/2017 0513   CREATININE 0.72 02/17/2017 1000   CREATININE 0.78 02/16/2017 0836   CALCIUM 9.1 02/18/2017 0513   CALCIUM 8.9 02/17/2017 1000   CALCIUM 9.1 02/16/2017 0836   GFRNONAA >60 02/18/2017 0513   GFRNONAA >60 02/17/2017 1000   GFRNONAA >60 02/16/2017 0836   GFRAA >60 02/18/2017 0513   GFRAA >60 02/17/2017 1000   GFRAA >60 02/16/2017 0836   CBC    Component Value Date/Time   WBC 7.7 02/18/2017 0513   RBC 5.55 (H) 02/18/2017 0513   HGB 14.6 02/18/2017 0513   HCT 47.6 (H) 02/18/2017 0513   PLT 118 (L) 02/18/2017 0513   MCV 85.8 02/18/2017 0513   MCH 26.3 02/18/2017 0513   MCHC 30.7 02/18/2017 0513   RDW 16.2 (H) 02/18/2017 0513   LYMPHSABS 1.2 02/15/2017 1357   MONOABS 1.2 (H) 02/15/2017 1357   EOSABS 0.2 02/15/2017 1357   BASOSABS 0.0 02/15/2017 1357   HEPATIC Function Panel  Recent Labs  02/16/17 0836 02/17/17 1000 02/18/17 0513  PROT 5.1* 4.9* 4.7*   HEMOGLOBIN A1C No components found for: HGA1C,  MPG CARDIAC ENZYMES Lab Results  Component Value Date   CKTOTAL 673 (H) 02/12/2016   TROPONINI 0.25 (HH) 02/15/2017   TROPONINI 0.26 (HH) 02/09/2017   TROPONINI 0.25 (HH) 02/09/2017   BNP No results for input(s): PROBNP in the last 8760 hours. TSH  Recent Labs  02/11/17 0502  TSH 2.076  CHOLESTEROL  Recent Labs  02/16/17 0357  CHOL 146    Scheduled Meds: .  stroke: mapping our early stages of recovery book   Does not apply Once  . apixaban  2.5 mg Oral BID  . atorvastatin  40 mg Oral q1800  . Chlorhexidine Gluconate Cloth  6 each Topical Daily  . diltiazem  30 mg Oral Q6H  . feeding supplement  1 Container Oral BID BM  . feeding supplement (ENSURE ENLIVE)  237 mL Oral Q24H  . mouth rinse  15 mL Mouth Rinse BID  . multivitamin with minerals  1 tablet Oral Daily  . sodium chloride flush  10-40 mL Intracatheter Q12H   Continuous Infusions: PRN Meds:.acetaminophen, hydrALAZINE, Influenza vac split quadrivalent PF, nitroGLYCERIN, ondansetron (ZOFRAN) IV, RESOURCE THICKENUP CLEAR, sodium  chloride flush  Assessment/Plan: Acute multiple emboli stroke Acute left heart systolic failure-compensated Persistent atrial fibrillation Left atrial thrombus/tumor Right lung pneumonia Hypertension Left lower extremity severe PVD  Continue medical treatment.   LOS: 11 days    [name]  MD  02/19/2017, 10:48 AM

## 2017-02-19 NOTE — Plan of Care (Signed)
Problem: Activity: Goal: Risk for activity intolerance will decrease Outcome: Progressing Discussed with patient plan of care for the vevening , pain management, grouping activities for rest and mobility with some teach back displayed.  Also, discussed the effects of a stroke and importance of moving and activity.

## 2017-02-20 DIAGNOSIS — I513 Intracardiac thrombosis, not elsewhere classified: Secondary | ICD-10-CM

## 2017-02-20 LAB — GLUCOSE, CAPILLARY: Glucose-Capillary: 122 mg/dL — ABNORMAL HIGH (ref 65–99)

## 2017-02-20 MED ORDER — APIXABAN 2.5 MG PO TABS: 2.5000 mg | ORAL_TABLET | Freq: Two times a day (BID) | ORAL | 0 refills | Status: AC

## 2017-02-20 MED ORDER — RESOURCE THICKENUP CLEAR PO POWD: ORAL | Status: DC

## 2017-02-20 MED ORDER — DILTIAZEM HCL 30 MG PO TABS: 30.0000 mg | ORAL_TABLET | Freq: Four times a day (QID) | ORAL | 0 refills | Status: AC

## 2017-02-20 MED ORDER — ENSURE ENLIVE PO LIQD: 237.0000 mL | ORAL | 12 refills | Status: AC

## 2017-02-20 MED ORDER — FUROSEMIDE 20 MG PO TABS: 20.0000 mg | ORAL_TABLET | Freq: Every day | ORAL | 0 refills | Status: AC

## 2017-02-20 MED ORDER — ATORVASTATIN CALCIUM 40 MG PO TABS: 40.0000 mg | ORAL_TABLET | Freq: Every day | ORAL | 0 refills | Status: AC

## 2017-02-20 MED ORDER — BOOST / RESOURCE BREEZE PO LIQD: 1.0000 | Freq: Two times a day (BID) | ORAL | 0 refills | Status: DC

## 2017-02-20 MED ORDER — LOSARTAN POTASSIUM 25 MG PO TABS: 25.0000 mg | ORAL_TABLET | Freq: Every day | ORAL | Status: AC

## 2017-02-20 MED ORDER — NITROGLYCERIN 0.4 MG SL SUBL: 0.4000 mg | SUBLINGUAL_TABLET | SUBLINGUAL | 0 refills | Status: AC | PRN

## 2017-02-20 MED ORDER — ADULT MULTIVITAMIN W/MINERALS CH: 1.0000 | ORAL_TABLET | Freq: Every day | ORAL | 0 refills | Status: AC

## 2017-02-20 NOTE — Care Management Note (Signed)
Case Management Note  Patient Details  Name: Rachel [name] MRN: [id number] Date of Birth: Feb 09, 1931  Subjective/Objective:  From home, presents with new onset afib, pt eval rec SNF,  plan for dc to [facility] today.                  Action/Plan: DC to [facility]F today.  Expected Discharge Date:  02/20/17               Expected Discharge Plan:  Skilled Nursing Facility  In-House Referral:  Clinical Social Work  Discharge planning Services  CM Consult  Post Acute Care Choice:    Choice offered to:     DME Arranged:    DME Agency:     HH Arranged:    HH Agency:     Status of Service:  Completed, signed off  If discussed at [employer], dates discussed:    Additional Comments:  Leone Haven, RN 02/20/2017, 3:08 PM

## 2017-02-20 NOTE — Progress Notes (Addendum)
CSW called by MD and informed that patient needing SNF and family prefers [facility].  CSW confirmed with pt niece and with pt that they are agreeable to this plan.    Patient will discharge to [facility] Anticipated discharge date: 9/24 Family notified: pt niece Transportation by [facility]- called at 1:35pm Report#: [phone] Rm [id number]  CSW signing off.  Burna Sis, LCSW Clinical Social Worker [phone]

## 2017-02-20 NOTE — Discharge Summary (Signed)
Physician Discharge Summary  Rachel [name] ZOX:[id number] DOB: 1931/04/28 DOA: 02/08/2017  PCP: Renford Dills, MD  Admit date: 02/08/2017 Discharge date: 02/20/2017  Time spent: 45 minutes  Recommendations for Outpatient Follow-up:  Patient will be discharged to skilled nursing facility. Continue physical, occupational, speech therapy.  Patient will need to follow up with primary care provider within one week of discharge.  Follow up with cardiology in one week. Follow up with neurology in 6 weeks. Patient should continue medications as prescribed.  Patient should follow a dysphagia 2 diet.   Discharge Diagnoses:  Acute CVA with significant aphasia New onset age fibrillation with RVR Leukocytosis Acute systolic and diastolic heart failure Elevated troponin Pleural effusion Right upper lobar pneumonia Acute kidney injury on chronic kidney disease, stage III Peripheral vascular disease Moderate malnutrition Urinary retention  Discharge Condition: Stable   Diet recommendation: dysphagia 2  Filed Weights   02/18/17 0404 02/19/17 0320 02/20/17 0119  Weight: 50.6 kg (111 lb 8.8 oz) 51.5 kg (113 lb 8.6 oz) 51.8 kg (114 lb 3.2 oz)    History of present illness:  on 9/12/218 by Dr. Daphine Deutscher Allenis a 83 y.o.femalewith medical history significant for hypertension, osteoarthritis, and history of cardiac arrhythmia, now presenting to the emergency department for evaluation of generalized weakness and malaise. Patient was accompanied by her caretaker, with whom she lives, reporting that the patient has been experiencing increasing generalized weakness over the past couple days, much worse today. Patient was seated on the toilet, but was too weak to get up, prompting the caretaker to call EMS for evaluation. Patient required significant assistance just to stand and was brought into the ED for evaluation. She denies any recent fevers or chills, acknowledges some increased  dyspnea and a slight cough, but denies any chest pain or palpitations. She also denies headache, change in vision or hearing, or focal numbness or weakness.  Hospital Course:  Interim history Patient was measured for new onset atrial fibrillation and was placed on Cardizem gtt. She is also found to be in volume overload with BNP of 3637, new heart failure and placed on IV Lasix. Patient underwent echocardiogram found to have an EF of 25% with severe hypokinesis. Patient also has a small thrombus in the left atrium. Cardiology was consulted and has signed off. On 02/15/2017 patient developed new A fascia and code stroke was initiated. Neurology was consulted. Patient was transferred to [facility] for neurology stroke team consult as well as further workup. Patient currently on heparin, transitioned to Eliquis.  Acute CVA with significant aphasia -First noted by caregivers on 02/14/2017, patient was seen by [name] who explained the physician at that time that things were not normal. -On exam patient was noted to have aphasia with left gaze preference. -Code stroke was initiated on the morning of 02/15/2017. -Thought to be secondary to LA thrombus. -Neurology consultation appreciated -CT head: No acute cortical infarction or large vessel occlusion evident -MRI brain: Widespread multifocal areas of acute infarction, nonhemorrhagic, throughout both cerebral hemispheres, as well as right cerebral hemisphere is consistent with shower of emboli. -CTA head and neck negative for emergent large vessel occlusion, normal for age anterior circulation, negative for arterial stenosis of the neck. Mild to moderate stenosis of the right PCA distal P1 segment. -speech now recommending dysphagia 2 diet -PT/OT recommended SNF -Discussed with neurology, [name], when patient is able to pass her swallow evaluation, with transition to Eliquis -transitioned to Eliquis  New onset age fibrillation  with  RVR -Discontinued heparin and diltiazem drips -CHADSVASC at least 81 (age, gender, CHF, Stroke) -Transitioned to Eliquis -Transitioned to cardizem  q6 hr- currently rate controlled  Leukocytosis -Resolved, possibly secondary to CVA versus new onset atrial fibrillation versus infection  Acute systolic and diastolic heart failure -BNP on admission 3637 -Echocardiogram EF of 25-30%, severe hypokinesis of the anteroseptal and anterior myocardium. Left atrium with small 2.4 cm x 1.8 cm irregular solid fixed mass in the appendage consistent with thrombus -Cardiology consulted and appreciated, has since signed off -Patient was placed on IV Lasix and losartan- which have been held -Monitor intake and output, daily weights -would restart losartan at  daily instead of  daily   Elevated troponin -Likely demand ischemia secondary to atrial fibrillation versus CHF versus stroke -Patient currently not a candidate for heart catheterization -Currently on heparin- transitioning to Eliquis  Pleural effusion -CTA head and neck showed bilateral layering pleural effusions, moderate or large on the right  Right upper lobar pneumonia -Blood cultures show no growth to date -Patient completed antibiotic therapy course during hospitalization  Acute kidney injury on chronic kidney disease, stage III -Resolved -Creatinine on mission is 1.23, currently 0.69 -Continue to monitor BMP  Peripheral vascular disease -Left ABI and Doppler waveforms indicate severe reduction in arterial flow or rest  Moderate malnutrition -nutrition consulted and recommended nutritional supplements  Urinary retention -Continue foley catheter  Consultants Neurology Cardiology  Procedures  Echocardiogram ABI  Discharge Exam: Vitals:   02/20/17 0600 02/20/17 1133  BP: 132/85 123/68  Pulse: 78 100  Resp: (!) 28 (!) 22  Temp:  97.6 F (36.4 C)  SpO2: 98% 100%   Patient still having speech  difficulties. Denies chest pain, shortness of breath, abdominal pain, nausea vomiting, diarrhea or constipation, dizziness or headache.   General: Well developed, well nourished, NAD, appears stated age  HEENT: NCAT, mucous membranes moist.  Cardiovascular: S1 S2 auscultated, 2/6SEM, irregular  Respiratory: Clear to auscultation bilaterally with equal chest rise  Abdomen: Soft, nontender, nondistended, + bowel sounds  Extremities: warm dry without cyanosis clubbing or edema  Neuro: AAOx3, nonfocal  Psych: Appropriate  Discharge Instructions Discharge Instructions    Amb referral to [facility]    Complete by:  As directed    Ambulatory referral to Neurology    Complete by:  As directed    Follow up with stroke clinic Darrol Angel preferred, if not available, then consider Sylvie Farrier, Marjory Lies or Lucia Gaskins whoever is available) at [facility] in about 6-8 weeks. Thanks.   Discharge instructions    Complete by:  As directed    Patient will be discharged to skilled nursing facility. Continue physical, occupational, speech therapy.  Patient will need to follow up with primary care provider within one week of discharge.  Follow up with cardiology in one week. Follow up with neurology in 6 weeks. Patient should continue medications as prescribed.  Patient should follow a dysphagia 2 diet.     Current Discharge Medication List    START taking these medications   Details  apixaban (ELIQUIS) 2.5 MG TABS tablet Take 1 tablet (2.5 mg total) by mouth 2 (two) times daily. Qty: 60 tablet, Refills: 0    atorvastatin (LIPITOR) 40 MG tablet Take 1 tablet (40 mg total) by mouth daily at 6 PM. Qty: 30 tablet, Refills: 0    diltiazem (CARDIZEM) 30 MG tablet Take 1 tablet (30 mg total) by mouth every 6 (six) hours. Qty: 120 tablet, Refills: 0    !!  feeding supplement (BOOST / RESOURCE BREEZE) LIQD Take 1 Container by mouth 2 (two) times daily between meals. Refills: 0    !! feeding supplement,  ENSURE ENLIVE, (ENSURE ENLIVE) LIQD Take 237 mLs by mouth daily. Qty: 237 mL, Refills: 12    furosemide (LASIX) 20 MG tablet Take 1 tablet (20 mg total) by mouth daily. Qty: 30 tablet, Refills: 0    Maltodextrin-Xanthan Gum (RESOURCE THICKENUP CLEAR) POWD Use as needed    Multiple Vitamin (MULTIVITAMIN WITH MINERALS) TABS tablet Take 1 tablet by mouth daily. Qty: 30 tablet, Refills: 0    nitroGLYCERIN (NITROSTAT) 0.4 MG SL tablet Place 1 tablet (0.4 mg total) under the tongue every 5 (five) minutes x 3 doses as needed for chest pain. Qty: 30 tablet, Refills: 0     !! - Potential duplicate medications found. Please discuss with provider.    CONTINUE these medications which have CHANGED   Details  losartan (COZAAR) 25 MG tablet Take 1 tablet (25 mg total) by mouth daily.      CONTINUE these medications which have NOT CHANGED   Details  acetaminophen (TYLENOL) 500 MG tablet Take 1 tablet (500 mg total) by mouth every 6 (six) hours as needed for mild pain. Qty: 30 tablet, Refills: 0    Chlorpheniramine-APAP (CORICIDIN) 2-325 MG TABS Take 1-2 tablets by mouth at bedtime as needed (for cough).    cimetidine (TAGAMET) 200 MG tablet Take 200 mg by mouth daily as needed (for heartburn).    metoprolol tartrate (LOPRESSOR) 25 MG tablet Take 25 mg by mouth 2 (two) times daily.       Allergies  Allergen Reactions  . Aspirin     nosebleed   Follow-up Information    [name], MD. Schedule an appointment as soon as possible for a visit in 1 week(s).   Specialty:  Cardiology Contact information: [address] [place] [address] [phone]        Nilda Riggs, NP. Schedule an appointment as soon as possible for a visit in 6 week(s).   Specialty:  Family Medicine Contact information: [address] [place] [address] [phone]            The results of significant diagnostics from this hospitalization (including imaging,  microbiology, ancillary and laboratory) are listed below for reference.    Significant Diagnostic Studies: Ct Angio Head W Or Wo Contrast  Result Date: 02/15/2017 CLINICAL DATA:  83 year old female with left side weakness and leftward gaze deviation. EXAM: CT ANGIOGRAPHY HEAD AND NECK TECHNIQUE: Multidetector CT imaging of the head and neck was performed using the standard protocol during bolus administration of intravenous contrast. Multiplanar CT image reconstructions and MIPs were obtained to evaluate the vascular anatomy. Carotid stenosis measurements (when applicable) are obtained utilizing NASCET criteria, using the distal internal carotid diameter as the denominator. CONTRAST:  100 mL Isovue 370 COMPARISON:  Head CT without contrast 1016 hours today. FINDINGS: CTA NECK Skeleton: Osteopenia. Intermittent cervical spine posterior element ankylosis and facet degeneration. No acute osseous abnormality identified. Upper chest: Layering pleural effusions, moderate to large on the right and small to moderate on the left. Some superimposed patchy nonspecific peripheral right upper lobe opacity. Probable underlying centrilobular emphysema. No superior mediastinal lymphadenopathy. Other neck: Negative.  No cervical lymphadenopathy. Aortic arch: The entire aortic arch is not included. Mild visible arch calcified atherosclerosis. Visible proximal great vessels are patent without stenosis despite mild plaque. Right carotid system: Negative visible brachiocephalic artery and right CCA  origin. Tortuous right CCA. Negative right carotid bifurcation. Negative cervical right ICA aside from tortuosity. Left carotid system: No proximal left CCA stenosis despite mild plaque. Minimal calcified plaque at the left carotid bifurcation affecting the ECA. Negative cervical left ICA aside from tortuosity. Vertebral arteries:No proximal subclavian artery stenosis with only mild plaque. Both vertebral artery origins are normal, the  left is mildly dominant. Both vertebral arteries are patent to the skullbase with no cervical vertebral stenosis. CTA HEAD Posterior circulation: The distal left vertebral artery is dominant and primarily supplies the basilar. Patent left PICA origin. No distal left vertebral stenosis. The non dominant right vertebral artery functionally terminates in the PICA. No basilar artery stenosis. AICA and SCA origins are patent. Fetal type left PCA origin. Right posterior communicating artery is also present. No proximal PCA stenosis. No proximal left PCA stenosis and normal left PCA branches. There is mild to moderate right PCA distal P1 segment stenosis (series 12, image 22). Right PCA branches appear normal. Anterior circulation: Both ICA siphons are patent with only minimal calcified plaque. Ophthalmic and posterior communicating artery origins are normal. Patent carotid termini. Normal MCA and ACA origins. Anterior communicating artery and bilateral ACA branches appear normal. Left MCA M1 segment, left MCA bifurcation, and left MCA branches are within normal limits. Right MCA M1 segment, right MCA bifurcation, and right MCA branches are within normal limits. Venous sinuses: Patent on the delayed images. Anatomic variants: Dominant left vertebral artery which primarily supplies the basilar. Fetal type left PCA origin. Delayed phase: No abnormal enhancement identified. Stable gray-white matter differentiation throughout the brain. no acute cortically based infarct or intracranial hemorrhage identified. Review of the MIP images confirms the above findings IMPRESSION: 1. Negative for emergent large vessel occlusion, normal for age anterior circulation, and negative for arterial stenosis in the neck. 2. There is a mild to moderate stenosis of the right PCA distal P1 segment. Otherwise the posterior circulation is negative. 3. Stable CT appearance of the brain since 1016 hours today. 4. Bilateral layering pleural effusions,  moderate or large on the right. This study was preliminarily discussed by telephone with Dr. Rose Phi on 02/15/2017 at 1135 hours. Electronically Signed   By: Odessa Fleming M.D.   On: 02/15/2017 11:42   Dg Chest 2 View  Result Date: 02/08/2017 CLINICAL DATA:  Week and not eating. EXAM: CHEST  2 VIEW COMPARISON:  February 11, 2016 FINDINGS: The mediastinal contour is normal. The heart size is enlarged. Patchy consolidation is identified in the right perihilar region and right upper lobe. There is no focal pneumonia in they left lung. There are small bilateral pleural effusions. The right hemidiaphragm is elevated. The bony structures are stable. IMPRESSION: Right upper lobe pneumonia. Follow-up chest x-ray after treatment is recommended to ensure complete resolution and to ensure no underlying mass is present. Small bilateral pleural effusions. Electronically Signed   By: Sherian Rein M.D.   On: 02/08/2017 22:00   Ct Angio Neck W Or Wo Contrast  Result Date: 02/15/2017 CLINICAL DATA:  83 year old female with left side weakness and leftward gaze deviation. EXAM: CT ANGIOGRAPHY HEAD AND NECK TECHNIQUE: Multidetector CT imaging of the head and neck was performed using the standard protocol during bolus administration of intravenous contrast. Multiplanar CT image reconstructions and MIPs were obtained to evaluate the vascular anatomy. Carotid stenosis measurements (when applicable) are obtained utilizing NASCET criteria, using the distal internal carotid diameter as the denominator. CONTRAST:  100 mL Isovue 370 COMPARISON:  Head  CT without contrast 1016 hours today. FINDINGS: CTA NECK Skeleton: Osteopenia. Intermittent cervical spine posterior element ankylosis and facet degeneration. No acute osseous abnormality identified. Upper chest: Layering pleural effusions, moderate to large on the right and small to moderate on the left. Some superimposed patchy nonspecific peripheral right upper lobe opacity. Probable  underlying centrilobular emphysema. No superior mediastinal lymphadenopathy. Other neck: Negative.  No cervical lymphadenopathy. Aortic arch: The entire aortic arch is not included. Mild visible arch calcified atherosclerosis. Visible proximal great vessels are patent without stenosis despite mild plaque. Right carotid system: Negative visible brachiocephalic artery and right CCA origin. Tortuous right CCA. Negative right carotid bifurcation. Negative cervical right ICA aside from tortuosity. Left carotid system: No proximal left CCA stenosis despite mild plaque. Minimal calcified plaque at the left carotid bifurcation affecting the ECA. Negative cervical left ICA aside from tortuosity. Vertebral arteries:No proximal subclavian artery stenosis with only mild plaque. Both vertebral artery origins are normal, the left is mildly dominant. Both vertebral arteries are patent to the skullbase with no cervical vertebral stenosis. CTA HEAD Posterior circulation: The distal left vertebral artery is dominant and primarily supplies the basilar. Patent left PICA origin. No distal left vertebral stenosis. The non dominant right vertebral artery functionally terminates in the PICA. No basilar artery stenosis. AICA and SCA origins are patent. Fetal type left PCA origin. Right posterior communicating artery is also present. No proximal PCA stenosis. No proximal left PCA stenosis and normal left PCA branches. There is mild to moderate right PCA distal P1 segment stenosis (series 12, image 22). Right PCA branches appear normal. Anterior circulation: Both ICA siphons are patent with only minimal calcified plaque. Ophthalmic and posterior communicating artery origins are normal. Patent carotid termini. Normal MCA and ACA origins. Anterior communicating artery and bilateral ACA branches appear normal. Left MCA M1 segment, left MCA bifurcation, and left MCA branches are within normal limits. Right MCA M1 segment, right MCA bifurcation,  and right MCA branches are within normal limits. Venous sinuses: Patent on the delayed images. Anatomic variants: Dominant left vertebral artery which primarily supplies the basilar. Fetal type left PCA origin. Delayed phase: No abnormal enhancement identified. Stable gray-white matter differentiation throughout the brain. no acute cortically based infarct or intracranial hemorrhage identified. Review of the MIP images confirms the above findings IMPRESSION: 1. Negative for emergent large vessel occlusion, normal for age anterior circulation, and negative for arterial stenosis in the neck. 2. There is a mild to moderate stenosis of the right PCA distal P1 segment. Otherwise the posterior circulation is negative. 3. Stable CT appearance of the brain since 1016 hours today. 4. Bilateral layering pleural effusions, moderate or large on the right. This study was preliminarily discussed by telephone with Dr. Rose Phi on 02/15/2017 at 1135 hours. Electronically Signed   By: Odessa Fleming M.D.   On: 02/15/2017 11:42   Mr Brain Wo Contrast  Result Date: 02/15/2017 CLINICAL DATA:  Suspected acute stroke. Leftward gaze deviation. History of cardiac arrhythmia, atrial fibrillation. Aphasia. Generalized weakness. EXAM: MRI HEAD WITHOUT CONTRAST TECHNIQUE: Multiplanar, multiecho pulse sequences of the brain and surrounding structures were obtained without intravenous contrast. COMPARISON:  Noncontrast CT head along with CTA head neck earlier today. FINDINGS: The patient was unable to remain motionless for the exam. Small or subtle lesions could be overlooked. Brain: Multifocal areas of restricted diffusion seen throughout both cerebral hemispheres and the RIGHT cerebellar hemisphere consistent with a shower of emboli. The largest number of lesions can be seen in  the LEFT hemisphere, involving the frontal, temporal, parietal, occipital, and insular regions as well as the basal ganglia. Most of these are punctate/focal in nature  although confluent restricted diffusion can be seen in the posterior LEFT parietal cortex. Smaller punctate lesions involve the RIGHT frontal cortex, centrum semiovale on the RIGHT, as well as the RIGHT cerebellar cortex and deep white matter. Within limits for detection on MR, no acute hemorrhage. There is advanced atrophy. Mild to moderate T2 and FLAIR hyperintensity throughout the deep white matter is noted, likely small vessel disease. Vascular: Flow voids are maintained. Skull and upper cervical spine: Normal marrow signal. Sinuses/Orbits: Chronic changes in the RIGHT maxillary sinus, prior surgery. No layering fluid. Negative orbits. Other: Trace mastoid effusion on the LEFT. IMPRESSION: Widespread multifocal areas of acute infarction, nonhemorrhagic, throughout both cerebral hemispheres, as well as the RIGHT cerebellar hemisphere, most consistent with a shower of emboli. The dominant hemispheric involvement is within the LEFT ICA/MCA territory. Electronically Signed   By: Elsie Stain M.D.   On: 02/15/2017 12:37   Dg Chest Port 1 View  Result Date: 02/16/2017 CLINICAL DATA:  PICC placement. EXAM: PORTABLE CHEST 1 VIEW COMPARISON:  Radiographs of February 16, 2017. FINDINGS: Stable cardiomegaly and central pulmonary vascular congestion. Atherosclerosis of thoracic aorta is noted. Mild bibasilar edema or atelectasis is noted. Mild bilateral pleural effusions are noted. No pneumothorax is noted. Bony thorax is unremarkable. Interval placement of left-sided PICC line is noted with distal tip in expected position of the SVC. IMPRESSION: Interval placement of left-sided PICC line with distal tip in expected position of SVC. Stable cardiomegaly and central pulmonary vascular congestion, with probable bibasilar edema or atelectasis. Mild bilateral pleural effusions are noted. Electronically Signed   By: Lupita Raider, M.D.   On: 02/16/2017 14:52   Dg Chest Port 1 View  Result Date: 02/16/2017 CLINICAL  DATA:  Follow-up pleural effusion. EXAM: PORTABLE CHEST 1 VIEW COMPARISON:  02/08/2017 FINDINGS: Patient is moderately rotated to the left. Lungs are adequately inflated and demonstrate minimal hazy bibasilar density likely small effusions without significant change. Interval improvement in mild opacification of the right perihilar region. Cardiomediastinal silhouette and remainder of the exam is unchanged. IMPRESSION: Improving right perihilar airspace process. Subtle hazy bibasilar density likely small effusions without significant change. Electronically Signed   By: Elberta Fortis M.D.   On: 02/16/2017 08:40   Ct Head Code Stroke Wo Contrast  Result Date: 02/15/2017 CLINICAL DATA:  Code stroke. LEFT-sided weakness. Head leaning to LEFT. Evaluate for stroke. Aphasia. LEFT-sided gaze preference. EXAM: CT HEAD WITHOUT CONTRAST TECHNIQUE: Contiguous axial images were obtained from the base of the skull through the vertex without intravenous contrast. COMPARISON:  02/11/2016. FINDINGS: Brain: No evidence for acute infarction, hemorrhage, mass lesion, hydrocephalus, or extra-axial fluid. Moderately advanced atrophy. Hypoattenuation of white matter suggesting small vessel disease. Chronic RIGHT cerebellar infarct. Tiny chronic LEFT caudate lacunar infarct. Vascular: Calcification of the internal carotid arteries and distal LEFT vertebral artery, consistent with cerebrovascular atherosclerotic disease. No signs of intracranial large vessel occlusion. Skull: Normal. Negative for fracture or focal lesion. Sinuses/Orbits: No acute finding. Other: Compared with priors, similar appearance. ASPECTS [facility] Stroke Program Early CT Score) - Ganglionic level infarction (caudate, lentiform nuclei, internal capsule, insula, M1-M3 cortex): 7 - Supraganglionic infarction (M4-M6 cortex): 3 Total score (0-10 with 10 being normal): 10 IMPRESSION: 1. Atrophy and small vessel disease. No acute cortical infarction or large vessel  occlusion is evident. 2. ASPECTS is 10. These results were called  by telephone at the time of interpretation on 02/15/2017 at 10:35 am to Dr. Rose Phi , who verbally acknowledged these results. Electronically Signed   By: Elsie Stain M.D.   On: 02/15/2017 10:38    Microbiology: No results found for this or any previous visit (from the past 240 hour(s)).   Labs: Basic Metabolic Panel:  Recent Labs Lab 02/14/17 0448 02/15/17 0416 02/15/17 1357 02/16/17 0836 02/17/17 1000 02/18/17 0513  NA 139 140 138 139 140 140  K 4.0 4.0 3.8 4.3 3.9 4.1  CL 100* 99* 97* 99* 102 101  CO2 29 30 31 29 30 31   GLUCOSE 87 104* 101* 93 85 93  BUN 48* 47* 44* 42* 39* 38*  CREATININE 0.82 0.76 0.90 0.78 0.72 0.69  CALCIUM 8.6* 9.0 9.0 9.1 8.9 9.1  MG 1.7 2.1  --   --   --   --   PHOS 2.2* 2.3*  --   --   --   --    Liver Function Tests:  Recent Labs Lab 02/15/17 1357 02/16/17 0836 02/17/17 1000 02/18/17 0513  AST 34 35 33 29  ALT 23 20 21 20   ALKPHOS 70 61 53 54  BILITOT 1.3* 1.1 1.1 0.7  PROT 5.8* 5.1* 4.9* 4.7*  ALBUMIN 3.3* 2.9* 2.7* 2.6*   No results for input(s): LIPASE, AMYLASE in the last 168 hours. No results for input(s): AMMONIA in the last 168 hours. CBC:  Recent Labs Lab 02/15/17 0416 02/15/17 1357 02/16/17 0356 02/17/17 1000 02/18/17 0513  WBC 11.1* 11.6* 9.2 7.9 7.7  NEUTROABS 8.1* 9.0*  --   --   --   HGB 19.1* 18.5* 19.2* 15.6* 14.6  HCT 57.2* 56.7* 60.0* 50.9* 47.6*  MCV 82.8 83.0 85.3 85.1 85.8  PLT 146* 111* 132* 124* 118*   Cardiac Enzymes:  Recent Labs Lab 02/15/17 1357  TROPONINI 0.25*   BNP: BNP (last 3 results)  Recent Labs  02/08/17 2043  BNP 3,637.0*    ProBNP (last 3 results) No results for input(s): PROBNP in the last 8760 hours.  CBG: No results for input(s): GLUCAP in the last 168 hours.     SignedEdsel Petrin  [facility] 02/20/2017, 12:39 PM

## 2017-02-20 NOTE — NC FL2 (Signed)
[place] MEDICAID FL2 LEVEL OF CARE SCREENING TOOL     IDENTIFICATION  Patient Name: [name] Birthdate: [birth year] Sex: female Admission Date (Current Location): 02/08/2017  [facility] and [place] Number:  Producer, television/film/video and Address:  [facility], [address] [place], [place] [address]      Provider Number: [id number]  Attending Physician Name and Address:  Edsel Petrin, DO  Relative Name and Phone Number:       Current Level of Care: Hospital Recommended Level of Care: Skilled Nursing Facility Prior Approval Number:    Date Approved/Denied:   PASRR Number: [id number]  Discharge Plan: SNF    Current Diagnoses: Patient Active Problem List   Diagnosis Date Noted  . Pleural effusion   . Thrombus of left atrial appendage without antecedent myocardial infarction   . Pressure injury of skin 02/17/2017  . Stroke (HCC) 02/15/2017  . Malnutrition of moderate degree 02/10/2017  . New onset atrial fibrillation (HCC) 02/08/2017  . CAP (community acquired pneumonia) 02/08/2017  . Renal insufficiency 02/08/2017  . NSTEMI (non-ST elevated myocardial infarction) (HCC) 02/08/2017  . Acute CHF (congestive heart failure) (HCC) 02/08/2017  . General weakness 02/08/2017  . Fall   . Displaced fracture of left femoral neck (HCC) 02/11/2016  . HTN (hypertension) 02/11/2016  . Dehydration, moderate 02/11/2016  . Elevated CPK 02/11/2016    Orientation RESPIRATION BLADDER Height & Weight     Self, Place  Normal Incontinent, Indwelling catheter Weight: 114 lb 3.2 oz (51.8 kg) Height:  5' (152.4 cm)  BEHAVIORAL SYMPTOMS/MOOD NEUROLOGICAL BOWEL NUTRITION STATUS      Incontinent Diet (see DC summary)  AMBULATORY STATUS COMMUNICATION OF NEEDS Skin   Extensive Assist Verbally PU Stage and Appropriate Care   PU Stage 2 Dressing:  (foam dressing changes located on sacrum)                   Personal Care Assistance Level of Assistance   Bathing, Dressing Bathing Assistance: Maximum assistance   Dressing Assistance: Maximum assistance     Functional Limitations Info             SPECIAL CARE FACTORS FREQUENCY  PT (By licensed PT), OT (By licensed OT), Speech therapy     PT Frequency: 5/wk OT Frequency: 5/wk     Speech Therapy Frequency: 2/wk      Contractures      Additional Factors Info  Code Status, Allergies Code Status Info: DNR Allergies Info: Aspirin           Current Medications (02/20/2017):  This is the current hospital active medication list Current Facility-Administered Medications  Medication Dose Route Frequency Provider Last Rate Last Dose  .  stroke: mapping our early stages of recovery book   Does not apply Once Zigmund Daniel., MD      . acetaminophen (TYLENOL) tablet 650 mg  650 mg Oral Q4H PRN Opyd, Lavone Neri, MD      . apixaban (ELIQUIS) tablet 2.5 mg  2.5 mg Oral BID Juliette Mangle, RPH   2.5 mg at 02/20/17 0914  . atorvastatin (LIPITOR) tablet 40 mg  40 mg Oral q1800 Opyd, Lavone Neri, MD   40 mg at 02/19/17 1725  . Chlorhexidine Gluconate Cloth 2 % PADS 6 each  6 each Topical Daily Zigmund Daniel., MD   6 each at 02/19/17 1001  . diltiazem (CARDIZEM) tablet 30 mg  30 mg Oral Q6H Mikhail, [place], DO  30 mg at 02/20/17 0530  . feeding supplement (BOOST / RESOURCE BREEZE) liquid 1 Container  1 Container Oral BID BM Noralee Stain Chahn-Yang, DO   1 Container at 02/20/17 0815  . feeding supplement (ENSURE ENLIVE) (ENSURE ENLIVE) liquid 237 mL  237 mL Oral Q24H Noralee Stain Chahn-Yang, DO   237 mL at 02/19/17 1333  . hydrALAZINE (APRESOLINE) injection 5 mg  5 mg Intravenous Q6H PRN Zigmund Daniel., MD      . Influenza vac split quadrivalent PF (FLUZONE HIGH-DOSE) injection 0.5 mL  0.5 mL Intramuscular Prior to discharge Noralee Stain Chahn-Yang, DO      . MEDLINE mouth rinse  15 mL Mouth Rinse BID Zigmund Daniel., MD   15 mL at 02/20/17 1610  .  multivitamin with minerals tablet 1 tablet  1 tablet Oral Daily Noralee Stain Chahn-Yang, DO   1 tablet at 02/20/17 9604  . nitroGLYCERIN (NITROSTAT) SL tablet 0.4 mg  0.4 mg Sublingual Q5 Min x 3 PRN Opyd, Lavone Neri, MD      . ondansetron (ZOFRAN) injection 4 mg  4 mg Intravenous Q6H PRN Opyd, Lavone Neri, MD      . RESOURCE THICKENUP CLEAR   Oral PRN Edsel Petrin, DO      . sodium chloride flush (NS) 0.9 % injection 10-40 mL  10-40 mL Intracatheter Q12H Zigmund Daniel., MD   40 mL at 02/19/17 2048  . sodium chloride flush (NS) 0.9 % injection 10-40 mL  10-40 mL Intracatheter PRN Zigmund Daniel., MD         Discharge Medications: Please see discharge summary for a list of discharge medications.  Relevant Imaging Results:  Relevant Lab Results:   Additional Information SS#: [id number]  Burna Sis, LCSW

## 2017-02-20 NOTE — Clinical Social Work Placement (Signed)
   CLINICAL SOCIAL WORK PLACEMENT  NOTE  Date:  02/20/2017  Patient Details  Name: Rachel [name] MRN: [id number] Date of Birth: March 21, 1931  Clinical Social Work is seeking post-discharge placement for this patient at the Skilled  Nursing Facility level of care (*CSW will initial, date and re-position this form in  chart as items are completed):  Yes   Patient/family provided with [place] Clinical Social Work Department's list of facilities offering this level of care within the geographic area requested by the patient (or if unable, by the patient's family).  Yes   Patient/family informed of their freedom to choose among providers that offer the needed level of care, that participate in Medicare, Medicaid or managed care program needed by the patient, have an available bed and are willing to accept the patient.  Yes   Patient/family informed of [facility]'s ownership interest in [facility] and [facility], as well as of the fact that they are under no obligation to receive care at these facilities.  PASRR submitted to EDS on       PASRR number received on       Existing PASRR number confirmed on 02/20/17     FL2 transmitted to all facilities in geographic area requested by pt/family on 02/20/17     FL2 transmitted to all facilities within larger geographic area on       Patient informed that his/her managed care company has contracts with or will negotiate with certain facilities, including the following:        Yes   Patient/family informed of bed offers received.  Patient chooses bed at [facility] [place]     Physician recommends and patient chooses bed at      Patient to be transferred to [facility] [place] on 02/20/17.  Patient to be transferred to facility by [facility]     Patient family notified on 02/20/17 of transfer.  Name of family member notified:  Ms. Mat Carne     PHYSICIAN Please sign DNR, Please sign FL2     Additional Comment:     _______________________________________________ Burna Sis, LCSW 02/20/2017, 1:36 PM

## 2017-02-20 NOTE — Care Management Important Message (Signed)
Important Message  Patient Details  Name: Rachel [name] MRN: [id number] Date of Birth: 12/16/1930   Medicare Important Message Given:  Yes    Kyla Balzarine 02/20/2017, 9:59 AM

## 2017-02-20 NOTE — Progress Notes (Signed)
Patient confused at discharge thinking she was transporting at 0200.  Her night nurse came in at 1630 which confused the patient's orientation to time.  Patient reoriented to person and time and left in stable condition via EMS transport with thickening and midnight medication.  Patient given discharge pack and was unable to sign at this time.

## 2017-02-20 NOTE — Plan of Care (Signed)
Problem: Coping: Goal: Ability to identify appropriate support needs will improve Outcome: Completed/Met Date Met: 02/20/17 Met

## 2017-02-20 NOTE — Progress Notes (Signed)
Left message with niece Mrs. Blackmere that patient has been transported to rehab.

## 2017-02-20 NOTE — Progress Notes (Signed)
  Speech Language Pathology Treatment: Dysphagia;Cognitive-Linquistic  Patient Details Name: Rachel [name] MRN: [id number] DOB: 09/18/1930 Today's Date: 02/20/2017 Time: 5366-4403 SLP Time Calculation (min) (ACUTE ONLY): 18 min  Assessment / Plan / Recommendation Clinical Impression  SLP provided assistance and Mod cues for use of swallowing strategies during breakfast meal. She had some immediate coughing that immediately followed solid boluses, which was reduced with SLP intervention for smaller bite size and alternating between bites/sips. Pt also needed assistance with scooping food during meals due to perseveration to task. Pt's voice is less hypophonic today, making it easier to differentiate her linguistic errors. She had one correct response during confrontational naming trials ("spoon"), but with all other trials marked by perseveration or neologisms. She had minimal improvements with sentence completion/phonemic cues, but she repeated at the word level well. She does not give clear responses to yes/no questions, but she did make personal choices during breakfast meal x2 when given binary options. She will need SLP f/u to maximize swallowing safety and functional communication.   HPI HPI: 83 yo female admitted with A fib, Pna, CHF. Hx of L hip hemi-posterior 2017, OA, arrythmia, HTN.  Code Stroke was called during admission.  Pt found to have widespread acute infarcts      SLP Plan  Continue with current plan of care       Recommendations  Diet recommendations: Dysphagia 2 (fine chop);Nectar-thick liquid Liquids provided via: Cup;Straw Medication Administration: Crushed with puree Supervision: Staff to assist with self feeding;Full supervision/cueing for compensatory strategies Compensations: Slow rate;Small sips/bites;Follow solids with liquid Postural Changes and/or Swallow Maneuvers: Seated upright 90 degrees;Upright 30-60 min after meal                Oral Care  Recommendations: Oral care BID Follow up Recommendations: Skilled Nursing facility SLP Visit Diagnosis: Dysphagia, oropharyngeal phase (R13.12);Aphasia (R47.01) Plan: Continue with current plan of care       GO                Maxcine Ham 02/20/2017, 10:04 AM  Maxcine Ham, M.A. CCC-SLP [phone]

## 2017-02-20 NOTE — Plan of Care (Signed)
Problem: Health Behavior/Discharge Planning: Goal: Ability to manage health-related needs will improve Outcome: Progressing Discussed with patient and plan for discharge with some teach back displayed.

## 2017-02-21 ENCOUNTER — Telehealth (HOSPITAL_COMMUNITY): Payer: Self-pay | Admitting: *Deleted

## 2017-02-21 NOTE — Telephone Encounter (Signed)
Pt was referred to afib clinic.  I cld pt to make appt but line rang with no answer, machine tone stated machine off.  Will try back

## 2017-02-27 ENCOUNTER — Other Ambulatory Visit: Payer: Self-pay

## 2017-02-27 DIAGNOSIS — R6889 Other general symptoms and signs: Secondary | ICD-10-CM

## 2017-02-27 DIAGNOSIS — I739 Peripheral vascular disease, unspecified: Secondary | ICD-10-CM

## 2017-03-01 ENCOUNTER — Encounter: Payer: Self-pay | Admitting: Vascular Surgery

## 2017-03-03 ENCOUNTER — Encounter: Payer: Self-pay | Admitting: Vascular Surgery

## 2017-03-03 ENCOUNTER — Ambulatory Visit (HOSPITAL_COMMUNITY)
Admission: RE | Admit: 2017-03-03 | Discharge: 2017-03-03 | Disposition: A | Discharge status: Home / Self Care | Payer: No Typology Code available for payment source | Source: Ambulatory Visit | Attending: Vascular Surgery | Admitting: Vascular Surgery

## 2017-03-03 ENCOUNTER — Ambulatory Visit (INDEPENDENT_AMBULATORY_CARE_PROVIDER_SITE_OTHER): Payer: Medicare Other | Admitting: Vascular Surgery

## 2017-03-03 VITALS — BP 115/68 | HR 69 | Temp 97.1°F | Resp 14 | Ht 60.0 in | Wt 108.0 lb

## 2017-03-03 DIAGNOSIS — R6889 Other general symptoms and signs: Secondary | ICD-10-CM

## 2017-03-03 DIAGNOSIS — R0989 Other specified symptoms and signs involving the circulatory and respiratory systems: Secondary | ICD-10-CM | POA: Diagnosis not present

## 2017-03-03 DIAGNOSIS — I739 Peripheral vascular disease, unspecified: Secondary | ICD-10-CM

## 2017-03-03 DIAGNOSIS — I70202 Unspecified atherosclerosis of native arteries of extremities, left leg: Secondary | ICD-10-CM | POA: Insufficient documentation

## 2017-03-03 LAB — VAS US LOWER EXTREMITY ARTERIAL DUPLEX
LATIBDISTSYS: 13 cm/s
LEFT PERO DIST SYS: 12 cm/s
LSFDPSV: -59 cm/s
Left super femoral mid sys PSV: -72 cm/s
Left super femoral prox sys PSV: -84 cm/s
RATIBDISTSYS: 42 cm/s
RSFMPSV: -94 cm/s
RSFPPSV: 109 cm/s
RTIBDISTSYS: 86 cm/s
Right peroneal sys PSV: 53 cm/s
Right super femoral dist sys PSV: -62 cm/s
left post tibial dist sys: 19 cm/s

## 2017-03-03 NOTE — Progress Notes (Signed)
Patient ID: Rachel [name], female   DOB: 1930-07-04, 83 y.o.   MRN: [id number]  Reason for Consult: New Patient (Initial Visit) (bilateral foot pain)   Referred by Jarome Matin, MD  Subjective:     HPI:  Rachel [name] is a 83 y.o. female with recent hospitalization for stroke and found to have severely depressed ABI at that time. She is now in a nursing home. She has very difficult time with memory and has had no family around according to the nursing home staff. Very difficult to get a history other than the chart review today. She does complain of bilateral foot pain. She does walk without palpable walker according to her and the staff. She is not oriented to time or place at this time. She does not know her medications although on review she does take a statin and eliquis. It does not appear she has had lower extremity surgery in the past.  Past Medical History:  Diagnosis Date  . Arthritis    OA  . Fracture of femoral neck (HCC) 02/11/2016   LEFT  . Hypertension    Family History  Problem Relation Age of Onset  . Family history unknown: Yes   Past Surgical History:  Procedure Laterality Date  . APPENDECTOMY    . BREAST SURGERY     BIOPSY  . HIP ARTHROPLASTY Left 02/12/2016   Procedure: ARTHROPLASTY BIPOLAR HIP (HEMIARTHROPLASTY);  Surgeon: [name], MD;  Location: [facility] OR;  Service: Orthopedics;  Laterality: Left;    Short Social History:  Social History  Substance Use Topics  . Smoking status: Never Smoker  . Smokeless tobacco: Never Used  . Alcohol use No    Allergies  Allergen Reactions  . Aspirin     nosebleed    Current Outpatient Prescriptions  Medication Sig Dispense Refill  . acetaminophen (TYLENOL) 500 MG tablet Take 1 tablet (500 mg total) by mouth every 6 (six) hours as needed for mild pain. (Patient taking differently: Take 1,000 mg by mouth every 6 (six) hours as needed for mild pain, moderate pain, fever or headache. ) 30 tablet 0  .  apixaban (ELIQUIS) 2.5 MG TABS tablet Take 1 tablet (2.5 mg total) by mouth 2 (two) times daily. 60 tablet 0  . atorvastatin (LIPITOR) 40 MG tablet Take 1 tablet (40 mg total) by mouth daily at 6 PM. 30 tablet 0  . Chlorpheniramine-APAP (CORICIDIN) 2-325 MG TABS Take 1-2 tablets by mouth at bedtime as needed (for cough).    . cimetidine (TAGAMET) 200 MG tablet Take 200 mg by mouth daily as needed (for heartburn).    Marland Kitchen diltiazem (CARDIZEM) 30 MG tablet Take 1 tablet (30 mg total) by mouth every 6 (six) hours. 120 tablet 0  . feeding supplement (BOOST / RESOURCE BREEZE) LIQD Take 1 Container by mouth 2 (two) times daily between meals.  0  . feeding supplement, ENSURE ENLIVE, (ENSURE ENLIVE) LIQD Take 237 mLs by mouth daily. 237 mL 12  . furosemide (LASIX) 20 MG tablet Take 1 tablet (20 mg total) by mouth daily. 30 tablet 0  . losartan (COZAAR) 100 MG tablet Take 100 mg by mouth daily.  3  . losartan (COZAAR) 25 MG tablet Take 1 tablet (25 mg total) by mouth daily.    . Maltodextrin-Xanthan Gum (RESOURCE THICKENUP CLEAR) POWD Use as needed    . metoprolol tartrate (LOPRESSOR) 25 MG tablet Take 25 mg by mouth 2 (two) times daily.    . Multiple  Vitamin (MULTIVITAMIN WITH MINERALS) TABS tablet Take 1 tablet by mouth daily. 30 tablet 0  . nitroGLYCERIN (NITROSTAT) 0.4 MG SL tablet Place 1 tablet (0.4 mg total) under the tongue every 5 (five) minutes x 3 doses as needed for chest pain. 30 tablet 0   No current facility-administered medications for this visit.     REVIEW OF SYSTEMS  Unreliable given lack of family/caregiver and patient memory    Objective:  Objective   Vitals:   03/03/17 1503  BP: 115/68  Pulse: 69  Resp: 14  Temp: (!) 97.1 F (36.2 C)  SpO2: 98%  Weight: 108 lb (49 kg)  Height: 5' (1.524 m)   Body mass index is 21.09 kg/m.  Physical Exam  Constitutional: No distress.  HENT:  Head: Normocephalic.  Neck: Normal range of motion. Neck supple.  Cardiovascular: Normal  rate.   Pulses:      Radial pulses are 2+ on the right side, and 2+ on the left side.       Femoral pulses are 2+ on the right side, and 1+ on the left side.      Popliteal pulses are 0 on the left side.  Weak peroneal on left  Abdominal: Soft. She exhibits no mass.  Musculoskeletal: She exhibits no edema.  Neurological: She is alert.  Skin:  Left heel has superficial skin changes Distal left great toe with ulceration    Data: I have independently interpreted her lower extremity duplexes which have triphasic waveforms throughout the right and monophasic below the popliteal on the left. ABIs were not performed today but on review from before are 1 on the right and 0.3 on the left.     Assessment/Plan:     83yo nursing home resident with recent stroke presents with left lower extremity superficial ulceration of her heel and left great toe distal tip ulceration. She does appear to have most of her faculties about her  and she is adamant The procedure at this time. However follow up in 3 months with repeat ABIs. Should she have worsening ulceration between now and then we concerned see her back in [place] for angiogram or primary amputation which I would not favor given that she is still ambulatory with palpable walker. She needs to have the pressure offloading on her bilateral heels to prevent further skin breakdown which could hasten her need for intervention. She does demonstrate decent understanding and again is adamant to not have procedure at this time.     [name] MD [facility]

## 2017-03-10 NOTE — Addendum Note (Signed)
Addended by: Burton Apley A on: 03/10/2017 09:51 AM   Modules accepted: Orders

## 2017-03-13 ENCOUNTER — Inpatient Hospital Stay (HOSPITAL_COMMUNITY)
Admission: EM | Admit: 2017-03-13 | Discharge: 2017-03-16 | DRG: 871 | Disposition: A | Discharge status: Skilled Nursing Facility | Payer: Medicare Other | Attending: Internal Medicine | Admitting: Internal Medicine

## 2017-03-13 ENCOUNTER — Emergency Department (HOSPITAL_COMMUNITY): Payer: Medicare Other

## 2017-03-13 ENCOUNTER — Encounter (HOSPITAL_COMMUNITY): Payer: Self-pay | Admitting: Family Medicine

## 2017-03-13 DIAGNOSIS — I130 Hypertensive heart and chronic kidney disease with heart failure and stage 1 through stage 4 chronic kidney disease, or unspecified chronic kidney disease: Secondary | ICD-10-CM | POA: Diagnosis present

## 2017-03-13 DIAGNOSIS — R6510 Systemic inflammatory response syndrome (SIRS) of non-infectious origin without acute organ dysfunction: Secondary | ICD-10-CM

## 2017-03-13 DIAGNOSIS — J181 Lobar pneumonia, unspecified organism: Secondary | ICD-10-CM

## 2017-03-13 DIAGNOSIS — Z79899 Other long term (current) drug therapy: Secondary | ICD-10-CM | POA: Diagnosis not present

## 2017-03-13 DIAGNOSIS — I5022 Chronic systolic (congestive) heart failure: Secondary | ICD-10-CM | POA: Diagnosis present

## 2017-03-13 DIAGNOSIS — I4891 Unspecified atrial fibrillation: Secondary | ICD-10-CM | POA: Diagnosis present

## 2017-03-13 DIAGNOSIS — Y95 Nosocomial condition: Secondary | ICD-10-CM | POA: Diagnosis present

## 2017-03-13 DIAGNOSIS — Z66 Do not resuscitate: Secondary | ICD-10-CM | POA: Diagnosis present

## 2017-03-13 DIAGNOSIS — Z886 Allergy status to analgesic agent status: Secondary | ICD-10-CM | POA: Diagnosis not present

## 2017-03-13 DIAGNOSIS — I10 Essential (primary) hypertension: Secondary | ICD-10-CM | POA: Diagnosis not present

## 2017-03-13 DIAGNOSIS — I69320 Aphasia following cerebral infarction: Secondary | ICD-10-CM | POA: Diagnosis not present

## 2017-03-13 DIAGNOSIS — I5020 Unspecified systolic (congestive) heart failure: Secondary | ICD-10-CM

## 2017-03-13 DIAGNOSIS — Z8701 Personal history of pneumonia (recurrent): Secondary | ICD-10-CM | POA: Diagnosis not present

## 2017-03-13 DIAGNOSIS — J690 Pneumonitis due to inhalation of food and vomit: Secondary | ICD-10-CM | POA: Diagnosis not present

## 2017-03-13 DIAGNOSIS — I252 Old myocardial infarction: Secondary | ICD-10-CM

## 2017-03-13 DIAGNOSIS — I739 Peripheral vascular disease, unspecified: Secondary | ICD-10-CM | POA: Diagnosis present

## 2017-03-13 DIAGNOSIS — K224 Dyskinesia of esophagus: Secondary | ICD-10-CM | POA: Diagnosis present

## 2017-03-13 DIAGNOSIS — I480 Paroxysmal atrial fibrillation: Secondary | ICD-10-CM | POA: Diagnosis present

## 2017-03-13 DIAGNOSIS — F0150 Vascular dementia without behavioral disturbance: Secondary | ICD-10-CM | POA: Diagnosis not present

## 2017-03-13 DIAGNOSIS — N189 Chronic kidney disease, unspecified: Secondary | ICD-10-CM | POA: Diagnosis present

## 2017-03-13 DIAGNOSIS — D72829 Elevated white blood cell count, unspecified: Secondary | ICD-10-CM

## 2017-03-13 DIAGNOSIS — E785 Hyperlipidemia, unspecified: Secondary | ICD-10-CM | POA: Diagnosis present

## 2017-03-13 DIAGNOSIS — A419 Sepsis, unspecified organism: Principal | ICD-10-CM | POA: Diagnosis present

## 2017-03-13 DIAGNOSIS — Z960 Presence of urogenital implants: Secondary | ICD-10-CM | POA: Diagnosis not present

## 2017-03-13 DIAGNOSIS — Z7901 Long term (current) use of anticoagulants: Secondary | ICD-10-CM

## 2017-03-13 DIAGNOSIS — J189 Pneumonia, unspecified organism: Secondary | ICD-10-CM | POA: Diagnosis present

## 2017-03-13 HISTORY — DX: Unspecified combined systolic (congestive) and diastolic (congestive) heart failure: I50.40

## 2017-03-13 HISTORY — DX: Unspecified atrial fibrillation: I48.91

## 2017-03-13 HISTORY — DX: Pneumonia, unspecified organism: J18.9

## 2017-03-13 HISTORY — DX: Pleural effusion, not elsewhere classified: J90

## 2017-03-13 HISTORY — DX: Cerebral infarction, unspecified: I63.9

## 2017-03-13 HISTORY — DX: Aphasia: R47.01

## 2017-03-13 LAB — CBC
HCT: 48.6 % — ABNORMAL HIGH (ref 36.0–46.0)
Hemoglobin: 16 g/dL — ABNORMAL HIGH (ref 12.0–15.0)
MCH: 27.2 pg (ref 26.0–34.0)
MCHC: 32.9 g/dL (ref 30.0–36.0)
MCV: 82.7 fL (ref 78.0–100.0)
PLATELETS: 258 10*3/uL (ref 150–400)
RBC: 5.88 MIL/uL — ABNORMAL HIGH (ref 3.87–5.11)
RDW: 17.4 % — AB (ref 11.5–15.5)
WBC: 16.5 10*3/uL — ABNORMAL HIGH (ref 4.0–10.5)

## 2017-03-13 LAB — BASIC METABOLIC PANEL
Anion gap: 11 (ref 5–15)
BUN: 24 mg/dL — AB (ref 6–20)
CHLORIDE: 96 mmol/L — AB (ref 101–111)
CO2: 30 mmol/L (ref 22–32)
CREATININE: 0.57 mg/dL (ref 0.44–1.00)
Calcium: 9 mg/dL (ref 8.9–10.3)
GFR calc Af Amer: >60 mL/min (ref >60)
GFR calc non Af Amer: >60 mL/min (ref >60)
GLUCOSE: 99 mg/dL (ref 65–99)
Potassium: 3.6 mmol/L (ref 3.5–5.1)
Sodium: 137 mmol/L (ref 135–145)

## 2017-03-13 LAB — BRAIN NATRIURETIC PEPTIDE: B Natriuretic Peptide: 1651.7 pg/mL — ABNORMAL HIGH (ref 0.0–100.0)

## 2017-03-13 LAB — I-STAT TROPONIN, ED: Troponin i, poc: 0.01 ng/mL (ref 0.00–0.08)

## 2017-03-13 LAB — TROPONIN I

## 2017-03-13 LAB — LACTIC ACID, PLASMA: LACTIC ACID, VENOUS: 1.3 mmol/L (ref 0.5–1.9)

## 2017-03-13 MED ORDER — ACETAMINOPHEN 500 MG PO TABS: 500.0000 mg | ORAL_TABLET | Freq: Four times a day (QID) | ORAL | Status: DC | PRN

## 2017-03-13 MED ORDER — PRO-STAT SUGAR FREE PO LIQD
30.0000 mL | Freq: Two times a day (BID) | ORAL | Status: DC
  Administered 2017-03-13 – 2017-03-16 (×6): 30 mL via ORAL
  Filled 2017-03-13 (×6): qty 30

## 2017-03-13 MED ORDER — SODIUM CHLORIDE 0.9 % IV SOLN
INTRAVENOUS | Status: DC
  Administered 2017-03-14: 22:00:00 via INTRAVENOUS

## 2017-03-13 MED ORDER — APIXABAN 2.5 MG PO TABS
2.5000 mg | ORAL_TABLET | Freq: Two times a day (BID) | ORAL | Status: DC
  Administered 2017-03-13 – 2017-03-16 (×6): 2.5 mg via ORAL
  Filled 2017-03-13 (×5): qty 1

## 2017-03-13 MED ORDER — BOOST / RESOURCE BREEZE PO LIQD
1.0000 | Freq: Two times a day (BID) | ORAL | Status: DC
  Administered 2017-03-14: 1 via ORAL

## 2017-03-13 MED ORDER — DEXTROSE 5 % IV SOLN
1.0000 g | Freq: Two times a day (BID) | INTRAVENOUS | Status: DC
  Administered 2017-03-13 – 2017-03-15 (×5): 1 g via INTRAVENOUS
  Filled 2017-03-13 (×6): qty 1

## 2017-03-13 MED ORDER — ATORVASTATIN CALCIUM 40 MG PO TABS
40.0000 mg | ORAL_TABLET | Freq: Every day | ORAL | Status: DC
  Administered 2017-03-14 – 2017-03-15 (×2): 40 mg via ORAL
  Filled 2017-03-13 (×2): qty 1

## 2017-03-13 MED ORDER — VANCOMYCIN HCL IN DEXTROSE 1-5 GM/200ML-% IV SOLN
1000.0000 mg | Freq: Once | INTRAVENOUS | Status: AC
  Administered 2017-03-13: 1000 mg via INTRAVENOUS
  Filled 2017-03-13: qty 200

## 2017-03-13 MED ORDER — LOSARTAN POTASSIUM 50 MG PO TABS
25.0000 mg | ORAL_TABLET | Freq: Every day | ORAL | Status: DC
  Administered 2017-03-14 – 2017-03-16 (×3): 25 mg via ORAL
  Filled 2017-03-13 (×3): qty 1

## 2017-03-13 MED ORDER — RESOURCE THICKENUP CLEAR PO POWD
ORAL | Status: DC | PRN
  Filled 2017-03-13: qty 125

## 2017-03-13 MED ORDER — CEFTRIAXONE SODIUM 1 G IJ SOLR
1.0000 g | Freq: Once | INTRAMUSCULAR | Status: AC
  Administered 2017-03-13: 1 g via INTRAVENOUS
  Filled 2017-03-13: qty 10

## 2017-03-13 MED ORDER — VANCOMYCIN HCL 500 MG IV SOLR
500.0000 mg | INTRAVENOUS | Status: DC
  Filled 2017-03-13: qty 500

## 2017-03-13 MED ORDER — DEXTROSE 5 % IV SOLN
500.0000 mg | Freq: Once | INTRAVENOUS | Status: AC
  Administered 2017-03-13: 500 mg via INTRAVENOUS
  Filled 2017-03-13: qty 500

## 2017-03-13 MED ORDER — METOPROLOL TARTRATE 25 MG PO TABS
25.0000 mg | ORAL_TABLET | Freq: Two times a day (BID) | ORAL | Status: DC
  Administered 2017-03-13 – 2017-03-16 (×6): 25 mg via ORAL
  Filled 2017-03-13 (×6): qty 1

## 2017-03-13 MED ORDER — DILTIAZEM HCL 30 MG PO TABS
30.0000 mg | ORAL_TABLET | Freq: Four times a day (QID) | ORAL | Status: DC
  Administered 2017-03-13 – 2017-03-16 (×10): 30 mg via ORAL
  Filled 2017-03-13 (×11): qty 1

## 2017-03-13 NOTE — ED Notes (Signed)
Applied oxygen via [place] at 2L.

## 2017-03-13 NOTE — ED Notes (Signed)
Patient is in radiology.  

## 2017-03-13 NOTE — ED Provider Notes (Signed)
[facility]-EMERGENCY DEPT Provider Note   CSN: [id number] Arrival date & time: 03/13/17  1252     History   Chief Complaint Chief Complaint  Patient presents with  . Shortness of Breath    HPI Rachel [name] is a 83 y.o. female with admission 02/08/17 with elevated troponin, Patient had CVA while hospitalized, upper right lobe pneumonia with completed course of antibiotics while inpatient, pleural effusion, new onset A. Fib with RVR and new onset CHF echo with 25% EF and severe hypokinesis. She was discharged to skilled nursing facility with eliquis, cardizem, lasix and dysphagia 2 diet and PT/OT.  Patient is now presenting via EMS to ED with shortness of breath while going through PT with O2 sats dropping in the 80's reported by facility. EMS reported O2 sats 97% on room air. Patient reports continued sob and has had an episode of left sided, nonradiating C/P while in physical therapy but denies any chest pain at this time. She states that she was otherwise in her usual state of health prior to this episode. She recalls a cough recently,  Denies hemoptysis, fever, chills, nausea, vomiting.   HPI  Past Medical History:  Diagnosis Date  . Aphasia   . Arthritis    OA  . Atrial fibrillation (HCC)   . Fracture of femoral neck (HCC) 02/11/2016   LEFT  . Heart failure with reduced ejection fraction and diastolic dysfunction (HCC)   . Hypertension   . Pleural effusion   . Pneumonia   . Stroke [facility])     Patient Active Problem List   Diagnosis Date Noted  . Pleural effusion   . Thrombus of left atrial appendage without antecedent myocardial infarction   . Pressure injury of skin 02/17/2017  . Stroke (HCC) 02/15/2017  . Malnutrition of moderate degree 02/10/2017  . New onset atrial fibrillation (HCC) 02/08/2017  . CAP (community acquired pneumonia) 02/08/2017  . Renal insufficiency 02/08/2017  . NSTEMI (non-ST elevated myocardial infarction) (HCC) 02/08/2017  . Acute CHF  (congestive heart failure) (HCC) 02/08/2017  . General weakness 02/08/2017  . Fall   . Displaced fracture of left femoral neck (HCC) 02/11/2016  . HTN (hypertension) 02/11/2016  . Dehydration, moderate 02/11/2016  . Elevated CPK 02/11/2016    Past Surgical History:  Procedure Laterality Date  . APPENDECTOMY    . BREAST SURGERY     BIOPSY  . HIP ARTHROPLASTY Left 02/12/2016   Procedure: ARTHROPLASTY BIPOLAR HIP (HEMIARTHROPLASTY);  Surgeon: [name], MD;  Location: [facility] OR;  Service: Orthopedics;  Laterality: Left;    OB History    No data available       Home Medications    Prior to Admission medications   Medication Sig Start Date End Date Taking? Authorizing Provider  acetaminophen (TYLENOL) 500 MG tablet Take 1 tablet (500 mg total) by mouth every 6 (six) hours as needed for mild pain. Patient taking differently: Take 500 mg by mouth every 6 (six) hours as needed for mild pain, moderate pain, fever or headache.  02/12/16  Yes [name], MD  Amino Acids-Protein Hydrolys (FEEDING SUPPLEMENT, PRO-STAT SUGAR FREE 64,) LIQD Take 30 mLs by mouth 2 (two) times daily.   Yes [provider]  apixaban (ELIQUIS) 2.5 MG TABS tablet Take 1 tablet (2.5 mg total) by mouth 2 (two) times daily. 02/20/17  Yes Mikhail, Nita Sells, DO  atorvastatin (LIPITOR) 40 MG tablet Take 1 tablet (40 mg total) by mouth daily at 6 PM. 02/20/17  Yes Mikhail,  Maryann, DO  Chlorpheniramine-APAP (CORICIDIN) 2-325 MG TABS Take 1-2 tablets by mouth at bedtime as needed (for cough).   Yes [provider]  diltiazem (CARDIZEM) 30 MG tablet Take 1 tablet (30 mg total) by mouth every 6 (six) hours. 02/20/17  Yes Mikhail, Nita Sells, DO  furosemide (LASIX) 20 MG tablet Take 1 tablet (20 mg total) by mouth daily. 02/20/17  Yes Mikhail, Maryann, DO  losartan (COZAAR) 25 MG tablet Take 1 tablet (25 mg total) by mouth daily. 02/20/17  Yes Mikhail, Nita Sells, DO  metoprolol tartrate (LOPRESSOR) 25 MG tablet Take  25 mg by mouth 2 (two) times daily.   Yes [provider]  Multiple Vitamin (MULTIVITAMIN WITH MINERALS) TABS tablet Take 1 tablet by mouth daily. 02/21/17  Yes Mikhail, Nita Sells, DO  nitroGLYCERIN (NITROSTAT) 0.4 MG SL tablet Place 1 tablet (0.4 mg total) under the tongue every 5 (five) minutes x 3 doses as needed for chest pain. 02/20/17  Yes Mikhail, Slater, DO  feeding supplement (BOOST / RESOURCE BREEZE) LIQD Take 1 Container by mouth 2 (two) times daily between meals. Patient not taking: Reported on 03/13/2017 02/20/17   Edsel Petrin, DO  feeding supplement, ENSURE ENLIVE, (ENSURE ENLIVE) LIQD Take 237 mLs by mouth daily. 02/20/17   Edsel Petrin, DO  Maltodextrin-Xanthan Gum (RESOURCE THICKENUP CLEAR) POWD Use as needed Patient not taking: Reported on 03/13/2017 02/20/17   Edsel Petrin, DO    Family History Family History  Problem Relation Age of Onset  . Family history unknown: Yes    Social History Social History  Substance Use Topics  . Smoking status: Never Smoker  . Smokeless tobacco: Never Used  . Alcohol use No     Allergies   Aspirin   Review of Systems Review of Systems  Constitutional: Negative for chills and fever.  HENT: Negative for congestion, ear pain and sore throat.   Eyes: Negative for pain and visual disturbance.  Respiratory: Positive for cough and shortness of breath. Negative for chest tightness, wheezing and stridor.   Cardiovascular: Positive for chest pain. Negative for palpitations and leg swelling.       Resolved c/p. One episode during PT prior to arrival  Gastrointestinal: Negative for abdominal pain and vomiting.  Genitourinary: Negative for difficulty urinating, dysuria and hematuria.  Skin: Positive for wound. Negative for color change, pallor and rash.       Midline thoracic spine  Neurological: Negative for dizziness, seizures, syncope, facial asymmetry and headaches.     Physical Exam Updated Vital Signs BP (!)  148/137   Pulse (!) 51   Temp 98.3 F (36.8 C) (Oral)   Resp (!) 26   Ht 5' (1.524 m)   SpO2 99%   Physical Exam  Constitutional: She is oriented to person, place, and time. She appears well-developed. No distress.  Afebrile, nontoxic-appearing, sitting comfortably in bed in no acute distress.  HENT:  Head: Normocephalic and atraumatic.  Eyes: Conjunctivae and EOM are normal.  Neck: Neck supple.  Cardiovascular: Normal rate, regular rhythm and normal heart sounds.   No murmur heard. Pulmonary/Chest: Effort normal and breath sounds normal. No respiratory distress. She has no wheezes. She has no rales. She exhibits no tenderness.  Abdominal: Soft. She exhibits no distension. There is no tenderness.  Musculoskeletal: She exhibits no edema.  Neurological: She is alert and oriented to person, place, and time. No cranial nerve deficit.  5 out of 5 strength to grips bilaterally. 4 out of 5 plantar flexion dorsiflexion bilaterally  Skin: Skin is warm and dry. She is not diaphoretic.  Psychiatric: She has a normal mood and affect.  Nursing note and vitals reviewed.    ED Treatments / Results  Labs (all labs ordered are listed, but only abnormal results are displayed) Labs Reviewed  BASIC METABOLIC PANEL - Abnormal; Notable for the following:       Result Value   Chloride 96 (*)    BUN 24 (*)    All other components within normal limits  CBC - Abnormal; Notable for the following:    WBC 16.5 (*)    RBC 5.88 (*)    Hemoglobin 16.0 (*)    HCT 48.6 (*)    RDW 17.4 (*)    All other components within normal limits  BRAIN NATRIURETIC PEPTIDE - Abnormal; Notable for the following:    B Natriuretic Peptide 1,651.7 (*)    All other components within normal limits  I-STAT TROPONIN, ED    EKG  EKG Interpretation None       Radiology Dg Chest 2 View  Result Date: 03/13/2017 CLINICAL DATA:  Shortness of breath. EXAM: CHEST  2 VIEW COMPARISON:  02/16/2017. FINDINGS: Motion  degraded exam. Enlarged cardiomediastinal silhouette. Calcified tortuous aorta. Asymmetric opacity at the RIGHT base, pleural effusion, in conjunction with probable consolidation worrisome for RIGHT lower lobe pneumonia. Superimposed RIGHT basilar atelectasis is likely. Asymmetric edema is possible but less favored. Streaky opacities in the RIGHT upper lobe also worse from priors, but less consolidative. No definite pneumothorax. Small LEFT effusion is stable. Skeletal osteopenia. IMPRESSION: Concern for new RIGHT lower lobe infiltrate, atelectasis, and effusion. Electronically Signed   By: Elsie Stain M.D.   On: 03/13/2017 14:08    Procedures Procedures (including critical care time)  Medications Ordered in ED Medications  cefTRIAXone (ROCEPHIN) 1 g in dextrose 5 % 50 mL IVPB (1 g Intravenous New Bag/Given 03/13/17 1559)  azithromycin (ZITHROMAX) 500 mg in dextrose 5 % 250 mL IVPB (not administered)     Initial Impression / Assessment and Plan / ED Course  I have reviewed the triage vital signs and the nursing notes.  Pertinent labs & imaging results that were available during my care of the patient were reviewed by me and considered in my medical decision making (see chart for details).    Patient presented via EMS with Sob and dropping sats. She is slightly tachypneic, O2 saturations 100% on room air on my assessment.  Admission approximately one month ago with CVA, new onset afib w/ RVR, new onset CHF. ECHO 02/09/2017: EF 25%-30%  and severe hypokinesis anterior and anteroseptal. likely Thrombus in LA appendage .  Chest X-ray today suspicious for new right lower lobar infiltration. BNP 1651 form 3657 one month ago Leukocytosis  Obtained permission to discuss findings with patient's closest relative: [employer] [phone] She requested to be updated with any changes in status as she is her only family contact.  Curb 65: 3 Antibiotics initiated in ED Patient was discussed  with admitting hospitalist Dr. Lowell Guitar.  She will be admitted for pneumonia. Final Clinical Impressions(s) / ED Diagnoses   Final diagnoses:  Community acquired pneumonia of right lower lobe of lung [facility])    New Prescriptions New Prescriptions   No medications on file     Gregary Cromer 03/13/17 1648    Abelino Derrick, MD 03/14/17 [phone]

## 2017-03-13 NOTE — H&P (Signed)
History and Physical    Rachel [name] ZOX:[id number] DOB: 05-25-1931 DOA: 03/13/2017  PCP: Renford Dills, MD  Patient coming from: [place] facility  I have personally briefly reviewed patient's old medical records in [facility]  Chief Complaint: shortness of breath  HPI: Rachel [name] is Rachel [name] 83 y.o. female with medical history significant of heart failure with reduced ejection fraction, atrial fibrillation, stroke, aphasia, chronic kidney disease,peripheral vascular disease, presenting with shortness of breath.  History is limited due to the patient's aphasia.  She notes that she had shortness of breath that started within the past couple days. She notes cough. Discussion with her caregivers at the facility, they note that her O2 sats dropped into the 70s when she was working with physical therapy. Per the ED's note she had episode of left-sided chest pain while in physical therapy, but denies any chest pain at the moment.  No nausea, vomiting, fevers.  ED Course: Labs, EKG, CXR, antibiotics.  Admission for pneumonia.   Review of Systems: As per HPI otherwise 10 point review of systems negative.   Past Medical History:  Diagnosis Date  . Aphasia   . Arthritis    OA  . Atrial fibrillation (HCC)   . Fracture of femoral neck (HCC) 02/11/2016   LEFT  . Heart failure with reduced ejection fraction and diastolic dysfunction (HCC)   . Hypertension   . Pleural effusion   . Pneumonia   . Stroke [facility])     Past Surgical History:  Procedure Laterality Date  . APPENDECTOMY    . BREAST SURGERY     BIOPSY  . HIP ARTHROPLASTY Left 02/12/2016   Procedure: ARTHROPLASTY BIPOLAR HIP (HEMIARTHROPLASTY);  Surgeon: [name], MD;  Location: [facility] OR;  Service: Orthopedics;  Laterality: Left;     reports that she has never smoked. She has never used smokeless tobacco. She reports that she does not drink alcohol or use drugs.  Allergies  Allergen Reactions  . Aspirin     nosebleed     Family History  Problem Relation Age of Onset  . Family history unknown: Yes   Prior to Admission medications   Medication Sig Start Date End Date Taking? Authorizing Provider  acetaminophen (TYLENOL) 500 MG tablet Take 1 tablet (500 mg total) by mouth every 6 (six) hours as needed for mild pain. Patient taking differently: Take 500 mg by mouth every 6 (six) hours as needed for mild pain, moderate pain, fever or headache.  02/12/16  Yes [name], MD  Amino Acids-Protein Hydrolys (FEEDING SUPPLEMENT, PRO-STAT SUGAR FREE 64,) LIQD Take 30 mLs by mouth 2 (two) times daily.   Yes [provider]  apixaban (ELIQUIS) 2.5 MG TABS tablet Take 1 tablet (2.5 mg total) by mouth 2 (two) times daily. 02/20/17  Yes Mikhail, Nita Sells, DO  atorvastatin (LIPITOR) 40 MG tablet Take 1 tablet (40 mg total) by mouth daily at 6 PM. 02/20/17  Yes Mikhail, Nita Sells, DO  Chlorpheniramine-APAP (CORICIDIN) 2-325 MG TABS Take 1-2 tablets by mouth at bedtime as needed (for cough).   Yes [provider]  diltiazem (CARDIZEM) 30 MG tablet Take 1 tablet (30 mg total) by mouth every 6 (six) hours. 02/20/17  Yes Mikhail, Nita Sells, DO  furosemide (LASIX) 20 MG tablet Take 1 tablet (20 mg total) by mouth daily. 02/20/17  Yes Mikhail, Maryann, DO  losartan (COZAAR) 25 MG tablet Take 1 tablet (25 mg total) by mouth daily. 02/20/17  Yes Mikhail, [place], DO  metoprolol  tartrate (LOPRESSOR) 25 MG tablet Take 25 mg by mouth 2 (two) times daily.   Yes [provider]  Multiple Vitamin (MULTIVITAMIN WITH MINERALS) TABS tablet Take 1 tablet by mouth daily. 02/21/17  Yes Mikhail, Nita Sells, DO  nitroGLYCERIN (NITROSTAT) 0.4 MG SL tablet Place 1 tablet (0.4 mg total) under the tongue every 5 (five) minutes x 3 doses as needed for chest pain. 02/20/17  Yes Mikhail, [place], DO  feeding supplement (BOOST / RESOURCE BREEZE) LIQD Take 1 Container by mouth 2 (two) times daily between meals. Patient not taking: Reported on  03/13/2017 02/20/17   Edsel Petrin, DO  feeding supplement, ENSURE ENLIVE, (ENSURE ENLIVE) LIQD Take 237 mLs by mouth daily. 02/20/17   Edsel Petrin, DO  Maltodextrin-Xanthan Gum (RESOURCE [facility] CLEAR) POWD Use as needed Patient not taking: Reported on 03/13/2017 02/20/17   Edsel Petrin, DO    Physical Exam: Vitals:   03/13/17 1515 03/13/17 1545 03/13/17 1718 03/13/17 1923  BP: (!) 159/81 (!) 148/137 (!) 154/124 (!) 150/80  Pulse: (!) 51  (!) 109 93  Resp: (!) 36 (!) 26 (!) 37 (!) 24  Temp:   98.3 F (36.8 C)   TempSrc:   Oral   SpO2: 99%  99% 100%  Height:        Constitutional: NAD, calm, comfortable Vitals:   03/13/17 1515 03/13/17 1545 03/13/17 1718 03/13/17 1923  BP: (!) 159/81 (!) 148/137 (!) 154/124 (!) 150/80  Pulse: (!) 51  (!) 109 93  Resp: (!) 36 (!) 26 (!) 37 (!) 24  Temp:   98.3 F (36.8 C)   TempSrc:   Oral   SpO2: 99%  99% 100%  Height:       Eyes: PERRL, lids and conjunctivae normal ENMT: Mucous membranes are moist. Posterior pharynx clear of any exudate or lesions.Normal dentition.  Neck: normal, supple, no masses, no thyromegaly Respiratory: clear to auscultation bilaterally, no wheezing, no crackles. Normal respiratory effort. No accessory muscle use.  Cardiovascular: Regular rate and rhythm, no murmurs / rubs / gallops. No extremity edema. Faint pedal pulses.  Abdomen: no tenderness, no masses palpated. No hepatosplenomegaly. Bowel sounds positive.  Musculoskeletal: no clubbing / cyanosis. No joint deformity upper and lower extremities. Good ROM, no contractures. Normal muscle tone.  Skin: L foot with pressure injury to heel Neurologic: CN 2-12 grossly intact. Moving all extremities.  Difficult to test strength as didn't follow instructions well.  Aphasia.   Psychiatric: Difficult to assess with aphasia. Normal mood.   Labs on Admission: I have personally reviewed following labs and imaging studies  CBC:  Recent Labs Lab 03/13/17 1404    WBC 16.5*  HGB 16.0*  HCT 48.6*  MCV 82.7  PLT 258   Basic Metabolic Panel:  Recent Labs Lab 03/13/17 1404  NA 137  K 3.6  CL 96*  CO2 30  GLUCOSE 99  BUN 24*  CREATININE 0.57  CALCIUM 9.0   GFR: Estimated Creatinine Clearance: 37.6 mL/min (by C-G formula based on SCr of 0.57 mg/dL). Liver Function Tests: No results for input(s): AST, ALT, ALKPHOS, BILITOT, PROT, ALBUMIN in the last 168 hours. No results for input(s): LIPASE, AMYLASE in the last 168 hours. No results for input(s): AMMONIA in the last 168 hours. Coagulation Profile: No results for input(s): INR, PROTIME in the last 168 hours. Cardiac Enzymes: No results for input(s): CKTOTAL, CKMB, CKMBINDEX, TROPONINI in the last 168 hours. BNP (last 3 results) No results for input(s): PROBNP in the last 8760 hours. HbA1C:  No results for input(s): HGBA1C in the last 72 hours. CBG: No results for input(s): GLUCAP in the last 168 hours. Lipid Profile: No results for input(s): CHOL, HDL, LDLCALC, TRIG, CHOLHDL, LDLDIRECT in the last 72 hours. Thyroid Function Tests: No results for input(s): TSH, T4TOTAL, FREET4, T3FREE, THYROIDAB in the last 72 hours. Anemia Panel: No results for input(s): VITAMINB12, FOLATE, FERRITIN, TIBC, IRON, RETICCTPCT in the last 72 hours. Urine analysis:    Component Value Date/Time   COLORURINE YELLOW 02/09/2017 0545   APPEARANCEUR CLEAR 02/09/2017 0545   LABSPEC 1.011 02/09/2017 0545   PHURINE 5.0 02/09/2017 0545   GLUCOSEU NEGATIVE 02/09/2017 0545   HGBUR SMALL (Areebah Meinders) 02/09/2017 0545   BILIRUBINUR NEGATIVE 02/09/2017 0545   KETONESUR NEGATIVE 02/09/2017 0545   PROTEINUR 100 (Chanique Duca) 02/09/2017 0545   NITRITE NEGATIVE 02/09/2017 0545   LEUKOCYTESUR TRACE (Tabias Swayze) 02/09/2017 0545    Radiological Exams on Admission: Dg Chest 2 View  Result Date: 03/13/2017 CLINICAL DATA:  Shortness of breath. EXAM: CHEST  2 VIEW COMPARISON:  02/16/2017. FINDINGS: Motion degraded exam. Enlarged cardiomediastinal  silhouette. Calcified tortuous aorta. Asymmetric opacity at the RIGHT base, pleural effusion, in conjunction with probable consolidation worrisome for RIGHT lower lobe pneumonia. Superimposed RIGHT basilar atelectasis is likely. Asymmetric edema is possible but less favored. Streaky opacities in the RIGHT upper lobe also worse from priors, but less consolidative. No definite pneumothorax. Small LEFT effusion is stable. Skeletal osteopenia. IMPRESSION: Concern for new RIGHT lower lobe infiltrate, atelectasis, and effusion. Electronically Signed   By: Elsie Stain M.D.   On: 03/13/2017 14:08    EKG: Independently reviewed. Compared to priors, appears similar, but now sinus.  Q waves in II, III, avF and V1-V3. Poor tracing overall, repeat EKG ordered.  Assessment/Plan Active Problems:   Pneumonia   HCAP: CXR with concern for new RLL infiltrate.  recently treated for pneumonia during last admission.  D/c on 9/24. Started on ceftriaxone, azithromycin in ED.   Will give vanc/cefepime given recent admission/pneumonia Blood cx after abx Sputum, urine legionella, urine strep Flu pending  SIRS: elevated WBC, but hemodynamically stable.  Occasionally with tachycardia, but intermittent.  As she currently is stable, will give gentle IVF due to her hx of CHF. Will f/u lactate, bolus if elevated.   Chest discomfort:  Per ED provider note, reported L sided CP during PT.  She denies CP to me, but it's difficult with her aphasia.  Suspect CP likely 2/2 pneumonia above.  Initial troponin negative.  She denies current CP.  EKG appears similar to priors.   Will trend troponins.  Repeat EKG given poor tracing.   Atrial fibrillation:  Continue eliquis Diltiazem  HFrEF: EF 25-30% on 02/09/2017 echo.  BNP is elevated, but lower than last value at ~1600.  Holding lasix given above Metop losartan  HLD: atorvastatin  Hx CVA with aphasia:  eliquis for afib.    PVD: L abi/dopplers indicate severe reduction in  arterial flow at rest  DVT prophylaxis: eliquis  Code Status: DNR  Family CommunicationViviana Simpler, [phone] Disposition Plan: [facility] Consults called: none  Admission status: obs   Lacretia Nicks MD [facility] Pager [phone]  If 7PM-7AM, please contact night-coverage [email or web address] Password [email or web address]  03/13/2017, 7:50 PM

## 2017-03-13 NOTE — Progress Notes (Signed)
Pharmacy Antibiotic Note  Rachel [name] is a 83 y.o. female admitted on 03/13/2017 with pneumonia.  Pharmacy has been consulted for vancomycin dosing. Cefepime has been ordered by physician as well.  Patient discharged 9/24 following CVA. She also completed course of antibiotics for CAP during that admission.   Plan:  Vancomycin 1gm IV x 1 then 500mg  IV q24h (goal trough 15-20 mcg/mL)  Check trough if remains on vancomycin   Adjust cefepime to 1gm IV q12h   Monitor renal function   Narrow based on culture results  Consider MRSA PCR  Height: 5' (152.4 cm) IBW/kg (Calculated) : 45.5  Temp (24hrs), Avg:98.3 F (36.8 C), Min:98.3 F (36.8 C), Max:98.3 F (36.8 C)   Recent Labs Lab 03/13/17 1404  WBC 16.5*  CREATININE 0.57    Estimated Creatinine Clearance: 37.6 mL/min (by C-G formula based on SCr of 0.57 mg/dL).    Allergies  Allergen Reactions  . Aspirin     nosebleed    Antimicrobials this admission: 10/15 ceftriaxone/azithromycin x 1 10/15 vancomycin >> 10/15 cefepime >>  Dose adjustments this admission:  Microbiology results: 10/15 sputum: ordered 10/15 Bcx:  10/15 influenza PCR: 10/15 Strep Ag:  10/15 Legionella Ag:  Thank you for allowing pharmacy to be a part of this patient's care.  Dannielle Huh 03/13/2017 6:14 PM

## 2017-03-13 NOTE — ED Notes (Signed)
Asked Misty Stanley, MUS to page Dr. Lowell Guitar to ask about these orders.

## 2017-03-13 NOTE — ED Notes (Signed)
Attempted to call report

## 2017-03-13 NOTE — ED Triage Notes (Signed)
Patient is from [facility] and transported via [facility]. Patient was in physical therapy and started complaining of shortness of breath. Also, oxygen saturation dropped to the 80's. The facility placed patient on oxygen via [place] at 5L. EMS removed oxygen and evaluated patient. She remained at 97% on RA with no complaints of shortness of breath.

## 2017-03-14 DIAGNOSIS — R6510 Systemic inflammatory response syndrome (SIRS) of non-infectious origin without acute organ dysfunction: Secondary | ICD-10-CM

## 2017-03-14 DIAGNOSIS — I10 Essential (primary) hypertension: Secondary | ICD-10-CM

## 2017-03-14 LAB — BASIC METABOLIC PANEL
ANION GAP: 9 (ref 5–15)
BUN: 27 mg/dL — ABNORMAL HIGH (ref 6–20)
CALCIUM: 8.6 mg/dL — AB (ref 8.9–10.3)
CHLORIDE: 100 mmol/L — AB (ref 101–111)
CO2: 27 mmol/L (ref 22–32)
Creatinine, Ser: 0.43 mg/dL — ABNORMAL LOW (ref 0.44–1.00)
GFR calc non Af Amer: >60 mL/min (ref >60)
GLUCOSE: 110 mg/dL — AB (ref 65–99)
POTASSIUM: 3.6 mmol/L (ref 3.5–5.1)
Sodium: 136 mmol/L (ref 135–145)

## 2017-03-14 LAB — TROPONIN I: Troponin I: <0.03 ng/mL (ref <0.03)

## 2017-03-14 LAB — CBC
HEMATOCRIT: 42 % (ref 36.0–46.0)
HEMOGLOBIN: 13.7 g/dL (ref 12.0–15.0)
MCH: 27 pg (ref 26.0–34.0)
MCHC: 32.6 g/dL (ref 30.0–36.0)
MCV: 82.8 fL (ref 78.0–100.0)
Platelets: 213 10*3/uL (ref 150–400)
RBC: 5.07 MIL/uL (ref 3.87–5.11)
RDW: 17.6 % — ABNORMAL HIGH (ref 11.5–15.5)
WBC: 10.6 10*3/uL — ABNORMAL HIGH (ref 4.0–10.5)

## 2017-03-14 LAB — GLUCOSE, CAPILLARY: Glucose-Capillary: 83 mg/dL (ref 65–99)

## 2017-03-14 LAB — INFLUENZA PANEL BY PCR (TYPE A & B)
INFLAPCR: NEGATIVE
INFLBPCR: NEGATIVE

## 2017-03-14 LAB — MRSA PCR SCREENING: MRSA BY PCR: NEGATIVE

## 2017-03-14 LAB — LACTIC ACID, PLASMA: Lactic Acid, Venous: 1.2 mmol/L (ref 0.5–1.9)

## 2017-03-14 LAB — HIV ANTIBODY (ROUTINE TESTING W REFLEX): HIV Screen 4th Generation wRfx: NONREACTIVE

## 2017-03-14 NOTE — Progress Notes (Signed)
PROGRESS NOTE    Rachel [name]  ZOX:[id number] DOB: 07-18-1930 DOA: 03/13/2017 PCP: Renford Dills, MD    Brief Narrative:  83 y.o. female with medical history significant of heart failure with reduced ejection fraction, atrial fibrillation, stroke, aphasia, chronic kidney disease,peripheral vascular disease, presenting with shortness of breath.  History is limited due to the patient's aphasia.  She notes that she had shortness of breath that started within the past couple days. She notes cough. Discussion with her caregivers at the facility, they note that her O2 sats dropped into the 70s when she was working with physical therapy. Per the ED's note she had episode of left-sided chest pain while in physical therapy, but denies any chest pain at the moment.  No nausea, vomiting, fevers.  Assessment & Plan:   Active Problems:   HTN (hypertension)   Pneumonia   SIRS (systemic inflammatory response syndrome) (HCC)   HCAP (healthcare-associated pneumonia)   HFrEF (heart failure with reduced ejection fraction) (HCC)   Unspecified atrial fibrillation (HCC)   Leukocytosis  HCAP: CXR with concern for new RLL infiltrate.  recently treated for pneumonia during last admission.  D/c on 9/24. Initially started on ceftriaxone, azithromycin in ED.   Patient started on vanc/cefepime given recent admission/pneumonia MRSA neg. Will d/c vanc Blood cx ordered, pending Sputum, urine legionella, urine strep pending Flu neg  SIRS on presentation: elevated WBC with presenting tachycardia, tachypnea, but hemodynamically stable.   -Improving on gentle IVF  Chest discomfort:  Per ED provider note, reported L sided CP during PT.  She denies CP to me, but it's difficult with her aphasia.  Suspect CP likely 2/2 pneumonia above.  Initial troponin negative.  She denies current CP.  EKG unchanged from prior studies Trop serially neg x 3  Atrial fibrillation:  Continue eliquis Continue on  Diltiazem  HFrEF: EF 25-30% on 02/09/2017 echo.  BNP is elevated, but lower than last value at ~1600.  Lasix held on admit secondary to presenting sepsis Metop Continue on losartan as tolerated  HLD: Continue atorvastatin as tolerated  Hx CVA with aphasia:  Continue on eliquis for afib.    PVD: L abi/dopplers indicate severe reduction in arterial flow at rest  DVT prophylaxis: Eliquis Code Status: DNR Family Communication: Pt in room, family not at bedside Disposition Plan: Uncertain at this time  Consultants:     Procedures:     Antimicrobials: Anti-infectives    Start     Dose/Rate Route Frequency Ordered Stop   03/14/17 1800  vancomycin (VANCOCIN) 500 mg in sodium chloride 0.9 % 100 mL IVPB     500 mg 100 mL/hr over 60 Minutes Intravenous Every 24 hours 03/13/17 1854     03/13/17 2000  ceFEPIme (MAXIPIME) 1 g in dextrose 5 % 50 mL IVPB     1 g 100 mL/hr over 30 Minutes Intravenous Every 12 hours 03/13/17 1745 03/21/17 2159   03/13/17 1900  vancomycin (VANCOCIN) IVPB 1000 mg/200 mL premix     1,000 mg 200 mL/hr over 60 Minutes Intravenous  Once 03/13/17 1854 03/13/17 2239   03/13/17 1600  cefTRIAXone (ROCEPHIN) 1 g in dextrose 5 % 50 mL IVPB     1 g 100 mL/hr over 30 Minutes Intravenous  Once 03/13/17 1548 03/13/17 1653   03/13/17 1600  azithromycin (ZITHROMAX) 500 mg in dextrose 5 % 250 mL IVPB     500 mg 250 mL/hr over 60 Minutes Intravenous  Once 03/13/17 1548 03/13/17 1829  Subjective: Without complaints this AM  Objective: Vitals:   03/13/17 2015 03/13/17 2135 03/14/17 0629 03/14/17 1322  BP: (!) 170/94 (!) 142/83 137/61 (!) 146/78  Pulse: 96 90 92 85  Resp: (!) 30 20 20 18   Temp:  (!) 97.4 F (36.3 C) 98.2 F (36.8 C) (!) 97.3 F (36.3 C)  TempSrc:  Oral Oral Oral  SpO2: 99% 97% 98% 100%  Height:        Intake/Output Summary (Last 24 hours) at 03/14/17 1439 Last data filed at 03/14/17 1011  Gross per 24 hour  Intake            512.5  ml  Output              275 ml  Net            237.5 ml   There were no vitals filed for this visit.  Examination:  General exam: Appears calm and comfortable  Respiratory system: Clear to auscultation. Respiratory effort normal. Cardiovascular system: S1 & S2 heard, RRR.  Gastrointestinal system: Abdomen is nondistended, soft and nontender. No organomegaly or masses felt. Normal bowel sounds heard. Central nervous system: Alert and oriented. No focal neurological deficits. Extremities: Symmetric 5 x 5 power. Skin: No rashes, lesions Psychiatry: Judgement and insight appear normal. Mood & affect appropriate.   Data Reviewed: I have personally reviewed following labs and imaging studies  CBC:  Recent Labs Lab 03/13/17 1404 03/14/17 0152  WBC 16.5* 10.6*  HGB 16.0* 13.7  HCT 48.6* 42.0  MCV 82.7 82.8  PLT 258 213   Basic Metabolic Panel:  Recent Labs Lab 03/13/17 1404 03/14/17 0152  NA 137 136  K 3.6 3.6  CL 96* 100*  CO2 30 27  GLUCOSE 99 110*  BUN 24* 27*  CREATININE 0.57 0.43*  CALCIUM 9.0 8.6*   GFR: Estimated Creatinine Clearance: 37.6 mL/min (A) (by C-G formula based on SCr of 0.43 mg/dL (L)). Liver Function Tests: No results for input(s): AST, ALT, ALKPHOS, BILITOT, PROT, ALBUMIN in the last 168 hours. No results for input(s): LIPASE, AMYLASE in the last 168 hours. No results for input(s): AMMONIA in the last 168 hours. Coagulation Profile: No results for input(s): INR, PROTIME in the last 168 hours. Cardiac Enzymes:  Recent Labs Lab 03/13/17 2108 03/14/17 0152 03/14/17 0828  TROPONINI <0.03 <0.03 <0.03   BNP (last 3 results) No results for input(s): PROBNP in the last 8760 hours. HbA1C: No results for input(s): HGBA1C in the last 72 hours. CBG:  Recent Labs Lab 03/14/17 0725  GLUCAP 83   Lipid Profile: No results for input(s): CHOL, HDL, LDLCALC, TRIG, CHOLHDL, LDLDIRECT in the last 72 hours. Thyroid Function Tests: No results for  input(s): TSH, T4TOTAL, FREET4, T3FREE, THYROIDAB in the last 72 hours. Anemia Panel: No results for input(s): VITAMINB12, FOLATE, FERRITIN, TIBC, IRON, RETICCTPCT in the last 72 hours. Sepsis Labs:  Recent Labs Lab 03/13/17 2108 03/14/17 0019  LATICACIDVEN 1.3 1.2    Recent Results (from the past 240 hour(s))  MRSA PCR Screening     Status: None   Collection Time: 03/14/17  6:00 AM  Result Value Ref Range Status   MRSA by PCR NEGATIVE NEGATIVE Final    Comment:        The GeneXpert MRSA Assay (FDA approved for NASAL specimens only), is one component of a comprehensive MRSA colonization surveillance program. It is not intended to diagnose MRSA infection nor to guide or monitor treatment for MRSA infections.  Radiology Studies: Dg Chest 2 View  Result Date: 03/13/2017 CLINICAL DATA:  Shortness of breath. EXAM: CHEST  2 VIEW COMPARISON:  02/16/2017. FINDINGS: Motion degraded exam. Enlarged cardiomediastinal silhouette. Calcified tortuous aorta. Asymmetric opacity at the RIGHT base, pleural effusion, in conjunction with probable consolidation worrisome for RIGHT lower lobe pneumonia. Superimposed RIGHT basilar atelectasis is likely. Asymmetric edema is possible but less favored. Streaky opacities in the RIGHT upper lobe also worse from priors, but less consolidative. No definite pneumothorax. Small LEFT effusion is stable. Skeletal osteopenia. IMPRESSION: Concern for new RIGHT lower lobe infiltrate, atelectasis, and effusion. Electronically Signed   By: Elsie Stain M.D.   On: 03/13/2017 14:08    Scheduled Meds: . apixaban  2.5 mg Oral BID  . atorvastatin  40 mg Oral q1800  . diltiazem  30 mg Oral Q6H  . feeding supplement  1 Container Oral BID BM  . feeding supplement (PRO-STAT SUGAR FREE 64)  30 mL Oral BID  . losartan  25 mg Oral Daily  . metoprolol tartrate  25 mg Oral BID   Continuous Infusions: . sodium chloride 50 mL/hr (03/13/17 2239)  . ceFEPime (MAXIPIME)  IV Stopped (03/14/17 1019)  . vancomycin       LOS: 1 day   Moishe Schellenberg, Scheryl Marten, MD [facility] Pager [phone]  If 7PM-7AM, please contact night-coverage [email or web address] Password [email or web address] 03/14/2017, 2:39 PM

## 2017-03-15 DIAGNOSIS — I480 Paroxysmal atrial fibrillation: Secondary | ICD-10-CM

## 2017-03-15 DIAGNOSIS — J690 Pneumonitis due to inhalation of food and vomit: Secondary | ICD-10-CM

## 2017-03-15 DIAGNOSIS — F0150 Vascular dementia without behavioral disturbance: Secondary | ICD-10-CM

## 2017-03-15 DIAGNOSIS — I5022 Chronic systolic (congestive) heart failure: Secondary | ICD-10-CM

## 2017-03-15 DIAGNOSIS — Z960 Presence of urogenital implants: Secondary | ICD-10-CM

## 2017-03-15 LAB — BASIC METABOLIC PANEL
Anion gap: 7 (ref 5–15)
BUN: 33 mg/dL — AB (ref 6–20)
CHLORIDE: 105 mmol/L (ref 101–111)
CO2: 26 mmol/L (ref 22–32)
CREATININE: 0.43 mg/dL — AB (ref 0.44–1.00)
Calcium: 8.6 mg/dL — ABNORMAL LOW (ref 8.9–10.3)
GFR calc Af Amer: >60 mL/min (ref >60)
GFR calc non Af Amer: >60 mL/min (ref >60)
Glucose, Bld: 87 mg/dL (ref 65–99)
Potassium: 4.3 mmol/L (ref 3.5–5.1)
Sodium: 138 mmol/L (ref 135–145)

## 2017-03-15 LAB — GLUCOSE, CAPILLARY: GLUCOSE-CAPILLARY: 117 mg/dL — AB (ref 65–99)

## 2017-03-15 NOTE — Progress Notes (Signed)
Called [facility] to see when the patients foley catheter was placed or if they changed it. Nurse unavailable at this time. Left a call back number

## 2017-03-15 NOTE — Consult Note (Signed)
WOC Nurse wound consult note Reason for Consult: left heel wound, also has left first toe wound, DTI's Wound type:pressure Pressure Injury POA: Yes Measurement:Left heel is 4cm x 4cm, distal part of left great toe 1.2cm x1.2cm Wound FXT:KWIO purple Drainage (amount, consistency, odor) none Periwound:intact Dressing procedure/placement/frequency:I have provided nurses with orders for Leaving wounds open to air, place both feet in [employer] [facility] [place] # [phone]). We will not follow, but will remain available to this patient, to nursing, and the medical and/or surgical teams.  Please re-consult if we need to assist further.   Barnett Hatter, RN-C, WTA-C Wound Treatment Associate

## 2017-03-15 NOTE — Evaluation (Signed)
Physical Therapy Evaluation Patient Details Name: Rachel [name] MRN: [id number] DOB: 10-30-1930 Today's Date: 03/15/2017   History of Present Illness  83 yo female admitted with Pna. Hx of CVA-01/2017 during last hospital admission, HTN, L hip hemi-posterior 2017, Afib, CHF, NSTEMI, CKD, PVD    Clinical Impression  Limited eval. Pt c/o not feeling well on today. She did agree to UE/LE ROM exercises. No family present during session. Will continue to follow and progress activity as tolerated. Recommend return to SNF.     Follow Up Recommendations SNF    Equipment Recommendations  None recommended by PT    Recommendations for Other Services       Precautions / Restrictions Precautions Precautions: Fall Restrictions Weight Bearing Restrictions: No      Mobility  Bed Mobility               General bed mobility comments: NT-pt declined bed mobility. She did not feel up to much PT today  Transfers                    Ambulation/Gait                Stairs            Wheelchair Mobility    Modified Rankin (Stroke Patients Only)       Balance                                             Pertinent Vitals/Pain Pain Assessment: Faces Faces Pain Scale: Hurts little more Pain Location: back, bottom, heels Pain Descriptors / Indicators: Sore;Grimacing Pain Intervention(s): Limited activity within patient's tolerance Vitals during session: BP 167/66, HR 59 bpm, O2 98% RA    Home Living Family/patient expects to be discharged to:: Skilled nursing facility                      Prior Function Level of Independence: Needs assistance               Hand Dominance        Extremity/Trunk Assessment   Upper Extremity Assessment Upper Extremity Assessment: Generalized weakness    Lower Extremity Assessment RLE Deficits / Details: patient did extend knee and mild move ankles, not full range. hip flex 3-/5 LLE  Deficits / Details: patient did extend knee and mild move ankles, not full range. hip flex 3-/5       Communication   Communication: No difficulties  Cognition Arousal/Alertness: Awake/alert Behavior During Therapy: WFL for tasks assessed/performed Overall Cognitive Status: No family/caregiver present to determine baseline cognitive functioning Area of Impairment: Problem solving                             Problem Solving: Slow processing General Comments: pt is following most commands:  moving limbs, feeding herself      General Comments      Exercises General Exercises - Upper Extremity Shoulder Flexion: AAROM;Both;5 reps;Supine;AROM Elbow Flexion: AAROM;AROM;5 reps;Both;Supine General Exercises - Lower Extremity Ankle Circles/Pumps: AROM;Both;5 reps;Supine Quad Sets: AROM;Both;5 reps;Supine Heel Slides: AAROM;Both;5 reps;Supine;AROM Straight Leg Raises: AAROM;Both;5 reps;Supine   Assessment/Plan    PT Assessment Patient needs continued PT services  PT Problem List Decreased strength;Decreased mobility;Decreased activity tolerance;Decreased balance;Pain;Decreased knowledge of use of DME  PT Treatment Interventions DME instruction;Therapeutic activities;Therapeutic exercise;Patient/family education;Gait training;Balance training;Functional mobility training    PT Goals (Current goals can be found in the Care Plan section)  Acute Rehab PT Goals Patient Stated Goal: home PT Goal Formulation: With patient Time For Goal Achievement: 03/29/17 Potential to Achieve Goals: Fair    Frequency Min 2X/week   Barriers to discharge        Co-evaluation               AM-PAC PT "6 Clicks" Daily Activity  Outcome Measure Difficulty turning over in bed (including adjusting bedclothes, sheets and blankets)?: Unable Difficulty moving from lying on back to sitting on the side of the bed? : Unable Difficulty sitting down on and standing up from a chair with  arms (e.g., wheelchair, bedside commode, etc,.)?: Unable Help needed moving to and from a bed to chair (including a wheelchair)?: Total Help needed walking in hospital room?: Total Help needed climbing 3-5 steps with a railing? : Total 6 Click Score: 6    End of Session   Activity Tolerance: Patient limited by fatigue Patient left: in bed;with call bell/phone within reach;with bed alarm set   PT Visit Diagnosis: Muscle weakness (generalized) (M62.81);Difficulty in walking, not elsewhere classified (R26.2)    Time: 1347-1401 PT Time Calculation (min) (ACUTE ONLY): 14 min   Charges:   PT Evaluation $PT Eval Moderate Complexity: 1 Mod     PT G Codes:          Rebeca AlertJannie Breindy Meadow, MPT Pager: [phone]

## 2017-03-15 NOTE — Progress Notes (Signed)
PROGRESS NOTE  ANALIESE KRUPKA ZOX:[id number] DOB: [birth year] DOA: 03/13/2017 PCP: Renford Dills, MD  HPI/Recap of past 24 hours:  Oriented to self, denies pain, no cough, no fever  Assessment/Plan: Active Problems:   HTN (hypertension)   Pneumonia   SIRS (systemic inflammatory response syndrome) (HCC)   HCAP (healthcare-associated pneumonia)   HFrEF (heart failure with reduced ejection fraction) (HCC)   Unspecified atrial fibrillation (HCC)   Leukocytosis  Recurrent pneumonia/sepsis on presentation:  Suspect aspiration Speech eval, continue abx improving  Paf; currently sinus  Continue eliquis  HFrEF: EF 25-30% on 02/09/2017 echo. BNP is elevated, but lower than last value at ~1600.  Lasix held on admit secondary to presenting sepsis Metop Continue on losartan as tolerated  HLD: Continue atorvastatin as tolerated  Hx CVA with aphasia: Continue on eliquis for afib.   PVD: L abi/dopplers indicate severe reduction in arterial flow at rest vascular office note reviewed, conservative management for now  Chronic indwelling foley: investigate when it was last changed  DVT prophylaxis: Eliquis Code Status: DNR Family Communication: Pt in room, family not at bedside Disposition Plan: SNf with palliative care in 24-48hrs  Consultants:   none  Procedures:   modified swallow   Antimicrobials:            Anti-infectives    Start     Dose/Rate Route Frequency Ordered Stop   03/14/17 1800  vancomycin (VANCOCIN) 500 mg in sodium chloride 0.9 % 100 mL IVPB     500 mg 100 mL/hr over 60 Minutes Intravenous Every 24 hours 03/13/17 1854     03/13/17 2000  ceFEPIme (MAXIPIME) 1 g in dextrose 5 % 50 mL IVPB     1 g 100 mL/hr over 30 Minutes Intravenous Every 12 hours 03/13/17 1745 03/21/17 2159   03/13/17 1900  vancomycin (VANCOCIN) IVPB 1000 mg/200 mL premix     1,000 mg 200 mL/hr over 60 Minutes Intravenous  Once 03/13/17 1854 03/13/17 2239    03/13/17 1600  cefTRIAXone (ROCEPHIN) 1 g in dextrose 5 % 50 mL IVPB     1 g 100 mL/hr over 30 Minutes Intravenous  Once 03/13/17 1548 03/13/17 1653   03/13/17 1600  azithromycin (ZITHROMAX) 500 mg in dextrose 5 % 250 mL IVPB     500 mg 250 mL/hr over 60 Minutes Intravenous  Once       Objective: BP (!) 165/77 (BP Location: Right Arm)   Pulse 94   Temp 98.3 F (36.8 C) (Oral)   Resp 18   Ht 5' (1.524 m)   SpO2 96%   Intake/Output Summary (Last 24 hours) at 03/15/17 0936 Last data filed at 03/15/17 0500  Gross per 24 hour  Intake              170 ml  Output              800 ml  Net             -630 ml   There were no vitals filed for this visit.  Exam: Patient is examined daily including today on 03/15/2017, exams remain the same as of yesterday except that has changed    General:  Frail, NAD, oriented to self only, foley in place  Cardiovascular: RRR  Respiratory: diminished at right base, no wheezing  Abdomen: Soft/ND/NT, positive BS  Musculoskeletal: No Edema  Neuro: alert, oriented to self only   Data Reviewed: Basic Metabolic Panel:  Recent Labs Lab 03/13/17 1404 03/14/17  0152 03/15/17 0604  NA 137 136 138  K 3.6 3.6 4.3  CL 96* 100* 105  CO2 30 27 26   GLUCOSE 99 110* 87  BUN 24* 27* 33*  CREATININE 0.57 0.43* 0.43*  CALCIUM 9.0 8.6* 8.6*   Liver Function Tests: No results for input(s): AST, ALT, ALKPHOS, BILITOT, PROT, ALBUMIN in the last 168 hours. No results for input(s): LIPASE, AMYLASE in the last 168 hours. No results for input(s): AMMONIA in the last 168 hours. CBC:  Recent Labs Lab 03/13/17 1404 03/14/17 0152  WBC 16.5* 10.6*  HGB 16.0* 13.7  HCT 48.6* 42.0  MCV 82.7 82.8  PLT 258 213   Cardiac Enzymes:    Recent Labs Lab 03/13/17 2108 03/14/17 0152 03/14/17 0828  TROPONINI <0.03 <0.03 <0.03   BNP (last 3 results)  Recent Labs  02/08/17 2043 03/13/17 1404  BNP 3,637.0* 1,651.7*    ProBNP (last 3 results) No  results for input(s): PROBNP in the last 8760 hours.  CBG:  Recent Labs Lab 03/14/17 0725 03/15/17 0751  GLUCAP 83 117*    Recent Results (from the past 240 hour(s))  Culture, blood (routine x 2) Call MD if unable to obtain prior to antibiotics being given     Status: None (Preliminary result)   Collection Time: 03/13/17  9:08 PM  Result Value Ref Range Status   Specimen Description BLOOD BLOOD LEFT ARM  Final   Special Requests IN PEDIATRIC BOTTLE Blood Culture adequate volume  Final   Culture   Final    NO GROWTH < 24 HOURS Performed at [facility], [address] [place], [place] [address]    Report Status PENDING  Incomplete  Culture, blood (routine x 2) Call MD if unable to obtain prior to antibiotics being given     Status: None (Preliminary result)   Collection Time: 03/13/17  9:08 PM  Result Value Ref Range Status   Specimen Description BLOOD BLOOD LEFT HAND  Final   Special Requests IN PEDIATRIC BOTTLE Blood Culture adequate volume  Final   Culture   Final    NO GROWTH < 24 HOURS Performed at [facility], [address], [place], [place] [address]    Report Status PENDING  Incomplete  MRSA PCR Screening     Status: None   Collection Time: 03/14/17  6:00 AM  Result Value Ref Range Status   MRSA by PCR NEGATIVE NEGATIVE Final    Comment:        The GeneXpert MRSA Assay (FDA approved for NASAL specimens only), is one component of a comprehensive MRSA colonization surveillance program. It is not intended to diagnose MRSA infection nor to guide or monitor treatment for MRSA infections.      Studies: No results found.  Scheduled Meds: . apixaban  2.5 mg Oral BID  . atorvastatin  40 mg Oral q1800  . diltiazem  30 mg Oral Q6H  . feeding supplement  1 Container Oral BID BM  . feeding supplement (PRO-STAT SUGAR FREE 64)  30 mL Oral BID  . losartan  25 mg Oral Daily  . metoprolol tartrate  25 mg Oral BID    Continuous Infusions: . sodium  chloride 50 mL/hr at 03/14/17 2217  . ceFEPime (MAXIPIME) IV Stopped (03/14/17 2248)     Time spent: I have personally reviewed and interpreted on  03/15/2017 daily labs, tele strips, imagings as discussed above under date review session and assessment and plans.  I reviewed  all nursing notes, pharmacy notes, vitals, pertinent old records  I have discussed plan of care as described above with RN , speech therapist , patient  on 03/15/2017   Nhyira Leano MD, PhD  [facility] Pager [phone]. If 7PM-7AM, please contact night-coverage at [email or web address], password [email or web address] 03/15/2017, 9:36 AM  LOS: 2 days

## 2017-03-15 NOTE — Care Management Note (Signed)
Case Management Note  Patient Details  Name: Rachel [name] MRN: [id number] Date of Birth: 07-31-1930  Subjective/Objective:                  chf  Action/Plan: Date:  March 15, 2017 Chart reviewed for concurrent status and case management needs.  Will continue to follow patient progress.  Discharge Planning: following for needs  Expected discharge date: [id number]  Marcelle Smiling, BSN, [place], [place]   [phone]  Expected Discharge Date:   (unknown)               Expected Discharge Plan:  Skilled Nursing Facility  In-House Referral:  Clinical Social Work  Discharge planning Services  CM Consult  Post Acute Care Choice:    Choice offered to:     DME Arranged:    DME Agency:     HH Arranged:    HH Agency:     Status of Service:  In process, will continue to follow  If discussed at Long Length of Stay Meetings, dates discussed:    Additional Comments:  Golda Acre, RN 03/15/2017, 9:50 AM

## 2017-03-15 NOTE — Evaluation (Addendum)
Clinical/Bedside Swallow Evaluation Patient Details  Name: Rachel FinnerShirley M [name] MRN: [id number] Date of Birth: 04/09/1931  Today's Date: 03/15/2017 Time: SLP Start Time (ACUTE ONLY): 1410 SLP Stop Time (ACUTE ONLY): 1520 SLP Time Calculation (min) (ACUTE ONLY): 70 min  Past Medical History:  Past Medical History:  Diagnosis Date  . Aphasia   . Arthritis    OA  . Atrial fibrillation (HCC)   . Fracture of femoral neck (HCC) 02/11/2016   LEFT  . Heart failure with reduced ejection fraction and diastolic dysfunction (HCC)   . Hypertension   . Pleural effusion   . Pneumonia   . Stroke [facility](HCC)    Past Surgical History:  Past Surgical History:  Procedure Laterality Date  . APPENDECTOMY    . BREAST SURGERY     BIOPSY  . HIP ARTHROPLASTY Left 02/12/2016   Procedure: ARTHROPLASTY BIPOLAR HIP (HEMIARTHROPLASTY);  Surgeon: Nadara MustardMarcus V Duda, MD;  [facility];  Service: Orthopedics;  Laterality: Left;   HPI:  83 yo female admitted with A fib, Pna, CHF. Hx of L hip hemi-posterior 2017, OA, arrythmia, HTN.  Code Stroke was called during admission.  Pt found to have widespread acute infarcts   Assessment / Plan / Recommendation Clinical Impression  Pt has baseline dysphagia due to her CVAs but hopeful for oropharyngeal swallow improvement with spontaneous recovery post-strokes = approximately 3 weeks.  Multiple swallows and intermittent throat clearing noted with nectar liquids and also at baseline. She does not have overt coughing even with large boluses but has h/o silent aspiration of thin on prior MBS.  Pt admits to baseline throat clearing even prior to multiple recent strokes - likely esophageal in nature.   Pt does not like to sit fully upright for = prefers to be significantly reclined.  Advised she sit upright as much as able.  Although patient admitted with repeat right lung pna given h/o silent dysphagia, recommend repeated MBS.  Pt agreeable to plan for repeat MBS.    If pt does not aspirate  on MBS, she may also benefit from esophagram to evaluate esophageal swallow.   SLP Visit Diagnosis: Dysphagia, unspecified (R13.10)    Aspiration Risk  Moderate aspiration risk    Diet Recommendation Dysphagia 2 (Fine chop);Nectar-thick liquid   Medication Administration: Whole meds with puree Supervision: Patient able to self feed Compensations: Slow rate;Small sips/bites Postural Changes: Seated upright at 90 degrees;Remain upright for at least 30 minutes after po intake    Other  Recommendations Oral Care Recommendations: Oral care QID   Follow up Recommendations Skilled Nursing facility      Frequency and Duration min 2x/week  2 weeks       Prognosis Prognosis for Safe Diet Advancement: Good Barriers to Reach Goals: Cognitive deficits;Language deficits      Swallow Study   General Date of Onset: 03/15/17 HPI: 83 yo female admitted with A fib, Pna, CHF. Hx of L hip hemi-posterior 2017, OA, arrythmia, HTN.  Code Stroke was called during admission.  Pt found to have widespread acute infarcts Previous Swallow Assessment: MBS 01/2017 after multiple strokes with pt having aspiration of thin, pharyngeal residuals due to decreased TBR and hyolaryngeal elevation Diet Prior to this Study: Dysphagia 1 (puree);Nectar-thick liquids Temperature Spikes Noted: No Respiratory Status: Room air History of Recent Intubation: No Behavior/Cognition: Alert;Cooperative;Pleasant mood Oral Cavity Assessment: Within Functional Limits Oral Care Completed by SLP: No Oral Cavity - Dentition: Adequate natural dentition Vision: Functional for self-feeding Patient Positioning: Upright in bed  Oral/Motor/Sensory Function Overall Oral Motor/Sensory Function: Within functional limits (pt leaning significantly to the left)   Ice Chips Ice chips: Within functional limits Presentation: Spoon   Thin Liquid Thin Liquid: Not tested (due to silent aspiration in past)    Nectar Thick Nectar Thick Liquid:  Impaired Presentation: Cup;Spoon Pharyngeal Phase Impairments: Multiple swallows;Throat Clearing - Delayed   Honey Thick Honey Thick Liquid: Not tested   Puree Puree: Impaired Presentation: Spoon Pharyngeal Phase Impairments: Throat Clearing - Delayed;Multiple swallows   Solid   GO   Solid: Impaired Pharyngeal Phase Impairments: Multiple swallows        Chales Abrahams 03/15/2017,3:47 PM  Donavan Burnet, MS [facility] SLP [phone]

## 2017-03-16 ENCOUNTER — Inpatient Hospital Stay (HOSPITAL_COMMUNITY): Payer: Medicare Other

## 2017-03-16 LAB — GLUCOSE, CAPILLARY: GLUCOSE-CAPILLARY: 133 mg/dL — AB (ref 65–99)

## 2017-03-16 LAB — CBC WITH DIFFERENTIAL/PLATELET
BASOS ABS: 0 10*3/uL (ref 0.0–0.1)
BASOS PCT: 0 %
EOS PCT: 1 %
Eosinophils Absolute: 0.1 10*3/uL (ref 0.0–0.7)
HCT: 41.8 % (ref 36.0–46.0)
Hemoglobin: 13.5 g/dL (ref 12.0–15.0)
LYMPHS PCT: 14 %
Lymphs Abs: 1.3 10*3/uL (ref 0.7–4.0)
MCH: 27.1 pg (ref 26.0–34.0)
MCHC: 32.3 g/dL (ref 30.0–36.0)
MCV: 83.8 fL (ref 78.0–100.0)
MONO ABS: 1.2 10*3/uL — AB (ref 0.1–1.0)
MONOS PCT: 13 %
Neutro Abs: 6.5 10*3/uL (ref 1.7–7.7)
Neutrophils Relative %: 72 %
PLATELETS: 216 10*3/uL (ref 150–400)
RBC: 4.99 MIL/uL (ref 3.87–5.11)
RDW: 17.4 % — AB (ref 11.5–15.5)
WBC: 9 10*3/uL (ref 4.0–10.5)

## 2017-03-16 LAB — MAGNESIUM: Magnesium: 1.6 mg/dL — ABNORMAL LOW (ref 1.7–2.4)

## 2017-03-16 LAB — BASIC METABOLIC PANEL
ANION GAP: 9 (ref 5–15)
BUN: 28 mg/dL — ABNORMAL HIGH (ref 6–20)
CALCIUM: 8.4 mg/dL — AB (ref 8.9–10.3)
CO2: 26 mmol/L (ref 22–32)
CREATININE: 0.37 mg/dL — AB (ref 0.44–1.00)
Chloride: 102 mmol/L (ref 101–111)
GLUCOSE: 93 mg/dL (ref 65–99)
Potassium: 3.9 mmol/L (ref 3.5–5.1)
Sodium: 137 mmol/L (ref 135–145)

## 2017-03-16 MED ORDER — MAGNESIUM SULFATE 2 GM/50ML IV SOLN
2.0000 g | Freq: Once | INTRAVENOUS | Status: AC
  Administered 2017-03-16: 2 g via INTRAVENOUS
  Filled 2017-03-16: qty 50

## 2017-03-16 MED ORDER — AMOXICILLIN-POT CLAVULANATE 875-125 MG PO TABS
1.0000 | ORAL_TABLET | Freq: Two times a day (BID) | ORAL | Status: DC
  Administered 2017-03-16: 1 via ORAL
  Filled 2017-03-16: qty 1

## 2017-03-16 MED ORDER — GABAPENTIN 250 MG/5ML PO SOLN: 300.0000 mg | Freq: Every day | ORAL | 0 refills | Status: AC

## 2017-03-16 MED ORDER — AMOXICILLIN-POT CLAVULANATE 875-125 MG PO TABS
1.0000 | ORAL_TABLET | Freq: Two times a day (BID) | ORAL | 0 refills | Status: AC
Start: 2017-03-16 — End: 2017-03-20

## 2017-03-16 MED ORDER — MAGNESIUM OXIDE 400 MG PO CAPS: 400.0000 mg | ORAL_CAPSULE | Freq: Every day | ORAL | 0 refills | Status: AC

## 2017-03-16 MED ORDER — GABAPENTIN 300 MG PO CAPS: 300.0000 mg | ORAL_CAPSULE | Freq: Every day | ORAL | Status: DC

## 2017-03-16 MED ORDER — GABAPENTIN 250 MG/5ML PO SOLN: 300.0000 mg | Freq: Every day | ORAL | Status: DC

## 2017-03-16 NOTE — Care Management Important Message (Signed)
Important Message  Patient Details  Name: KOLIE RISSMAN MRN: [id number] Date of Birth: 15-Aug-1930   Medicare Important Message Given:  Yes    Caren Macadam 03/16/2017, 9:59 AMImportant Message  Patient Details  Name: MARLIN NAKAO MRN: [id number] Date of Birth: 06/05/1930   Medicare Important Message Given:  Yes    Caren Macadam 03/16/2017, 9:59 AM

## 2017-03-16 NOTE — Progress Notes (Signed)
Modified Barium Swallow Progress Note  Patient Details  Name: Rachel [name] MRN: [id number] Date of Birth: [birth year]  Today's Date: 03/16/2017  Modified Barium Swallow completed.  Full report located under Chart Review in the Imaging Section.  Brief recommendations include the following:  Clinical Impression  Pt with MUCH improved oropharyngeal swallow ability compared to prior MBS.  She demonstrates much improved oral control - no aspiration or penetration observed across all consistencies.  She did have coordination difficulties with pill/pudding requiring expectoration with initial trial.  Pt later able to swallow tablet with thin when SLP placed it posterior in oral cavity.  Decreased tongue base retraction does result in mild - mod pharyngeal resdiuals without adequate sensation.  Cues for strong swallow prevents some residuals.  Unfortunately pt unable to dry swallow or "hock" on command likely due to her motor planning deficits from CVAs.  Recommend advance diet to dys3/thin with precautions.    Pt unable to have esophagram due to concerns for aspiration with oral control issues, Radiologist Dr Margo Aye nicely examined esophagus during MBS  - limited view however he stated pt without evidence of stricture and appears with motility deficits.  Therefore recommend small frequent meals and staying upright after po.    Pt noted to clear her throat frequently during tesing without barium visualized in larynx and she pointed to her facial sinuses to indicate area of discomfort - ? if this could contribute to aspiration pnas.     Swallow Evaluation Recommendations       SLP Diet Recommendations: Dysphagia 3 (Mech soft) solids;Thin liquid (small frequent meals)   Liquid Administration via: Cup;Straw   Medication Administration: Whole meds with puree   Supervision: Patient able to self feed   Compensations: Slow rate;Small sips/bites;Follow solids with liquid;Other (Comment);Effortful  swallow   Postural Changes: Remain semi-upright after after feeds/meals (Comment);Seated upright at 90 degrees   Oral Care Recommendations: Oral care BID      Donavan Burnet, MS [facility] SLP [phone]\  Chales Abrahams 03/16/2017,10:40 AM

## 2017-03-16 NOTE — Clinical Social Work Note (Signed)
Clinical Social Work Assessment  Patient Details  Name: Rachel [name] MRN: [id number] Date of Birth: 05-20-1931  Date of referral:  03/16/17               Reason for consult:  Discharge Planning                Permission sought to share information with:  Case Manager, Facility Medical sales representative, Family Supports Permission granted to share information::     Name::     Ambulance person  Agency::  [facility]  Relationship::  Niece  Contact Information:     Housing/Transportation Living arrangements for the past 2 months:  Skilled Building surveyor of Information:  Facility, Case Manager Patient Interpreter Needed:  None Criminal Activity/Legal Involvement Pertinent to Current Situation/Hospitalization:  No - Comment as needed Significant Relationships:  Other Family Members Lives with:  Facility Resident Do you feel safe going back to the place where you live?    Need for family participation in patient care:     Care giving concerns:  Family was not able to be reached. LCSW attempted to reach niece by phone. Facility does not have any concerns.   Social Worker assessment / plan:  LCSW consulted for return to SNF, [facility].  Patient was admitted for Pneumonia, shortness of breath.   Plan: Patient will return to [facility] at discharge.   Employment status:  Retired Health and safety inspector:  Medicare PT Recommendations:  Skilled Nursing Facility Information / Referral to community resources:  Skilled Nursing Facility  Patient/Family's Response to care:  Calls placed to patients niece. Left voicemail. Niece is aware of hospital admission and has been to the facility ([facility]).   Patient/Family's Understanding of and Emotional Response to Diagnosis, Current Treatment, and Prognosis:    LCSW unable to assess. Patient is not able to engage in the assessment. Family was not able to be reached at this time.   Will continue to try and  reach niece.   Emotional Assessment Appearance:  Appears stated age Attitude/Demeanor/Rapport:    Affect (typically observed):    Orientation:  Oriented to Self, Oriented to Place Alcohol / Substance use:  Not Applicable Psych involvement (Current and /or in the community):  No (Comment)  Discharge Needs  Concerns to be addressed:  No discharge needs identified Readmission within the last 30 days:  Yes Current discharge risk:  None Barriers to Discharge:  No Barriers Identified   Coralyn Helling, LCSW 03/16/2017, 10:28 AM

## 2017-03-16 NOTE — Progress Notes (Signed)
Pharmacy Antibiotic Note  Rachel [name] is a 83 y.o. female presented to ED from nursing facility on 03/13/2017 with complaint of weakness, malaise, increased dyspnea and cough. Chest x-ray on 03/13/17 notable for right upper lobe consolidation concerning for pneumonia. Vancomycin and cefepime were started on admission and subsequently de-escalated to cefepime due to negative MRSA PCR.   Today, 03/16/16 - Day 3 cefepime - WBC trending down, afebrile. - Scr stable - All cultures negative thus far  Plan: - Continue cefepime 1g q12h - Monitor renal function - Monitor WBC, vitals, and clinical status - F/u cultures and sensitivities.     Height: 5' (152.4 cm) IBW/kg (Calculated) : 45.5  Temp (24hrs), Avg:98.5 F (36.9 C), Min:98.3 F (36.8 C), Max:98.8 F (37.1 C)   Recent Labs Lab 03/13/17 1404 03/13/17 2108 03/14/17 0019 03/14/17 0152 03/15/17 0604 03/16/17 0624  WBC 16.5*  --   --  10.6*  --  9.0  CREATININE 0.57  --   --  0.43* 0.43* 0.37*  LATICACIDVEN  --  1.3 1.2  --   --   --     Estimated Creatinine Clearance: 37.6 mL/min (A) (by C-G formula based on SCr of 0.37 mg/dL (L)).    Allergies  Allergen Reactions  . Aspirin     nosebleed    Antimicrobials this admission: 10/15 ceftriaxone/azithromycin x 1 10/15 vancomycin >>10/16 10/15 cefepime >>  Dose adjustments this admission:   Microbiology results: 10/15 Bcx x2:  10/15 influenza PCR: neg 10/16 MRSA PCR: neg  Thank you for allowing pharmacy to be a part of this patient's care.  Laverta Baltimore  PharmD Candidate 03/16/2017 7:46 AM

## 2017-03-16 NOTE — Progress Notes (Signed)
Called report to [employer] at [employer].

## 2017-03-16 NOTE — Discharge Summary (Addendum)
Discharge Summary  VEERA STAPLETON UJW:[id number] DOB: Sep 27, 1930  PCP: Renford Dills, MD  Admit date: 03/13/2017 Discharge date: 03/16/2017  Time spent: >57mins, more than 50% time spent on coordination of care  Recommendations for Outpatient Follow-up:  1. F/u with SNF MD   for hospital discharge follow up, repeat cbc/bmp at follow up 2. F/u with vascular surgery for PVD 3. SNF to do voiding trial, if patient fail voiding trial, refer to urology, foley changed on 10/18  Discharge Diagnoses:  Active Hospital Problems   Diagnosis Date Noted  . Pneumonia 03/13/2017  . SIRS (systemic inflammatory response syndrome) (HCC) 03/13/2017  . HCAP (healthcare-associated pneumonia) 03/13/2017  . HFrEF (heart failure with reduced ejection fraction) (HCC) 03/13/2017  . Unspecified atrial fibrillation (HCC) 03/13/2017  . Leukocytosis 03/13/2017  . HTN (hypertension) 02/11/2016    Resolved Hospital Problems   Diagnosis Date Noted Date Resolved  No resolved problems to display.    Discharge Condition: stable  Diet recommendation:  (patient does has esophageal dysmotility, encourage small meals and multiple time a day, sitting up during and after meals)  SLP Diet Recommendations: Dysphagia 3 (Mech soft) solids;Thin liquid (small frequent meals)   Liquid Administration via: Cup;Straw   Medication Administration: Whole meds with puree   Supervision: Patient able to self feed   Compensations: Slow rate;Small sips/bites;Follow solids with liquid;Other (Comment);Effortful swallow   Postural Changes: Remain semi-upright after after feeds/meals (Comment);Seated upright at 90 degrees   Oral Care Recommendations: Oral care BID    History of present illness:  PCP: Renford Dills, MD  Patient coming from: [place] facility  I have personally briefly reviewed patient's old medical records in [facility]  Chief Complaint: shortness of breath  HPI: Rachel [name] is a 83 y.o.  female with medical history significant of heart failure with reduced ejection fraction, atrial fibrillation, stroke, aphasia, chronic kidney disease,peripheral vascular disease, presenting with shortness of breath.  History is limited due to the patient's aphasia.  She notes that she had shortness of breath that started within the past couple days. She notes cough. Discussion with her caregivers at the facility, they note that her O2 sats dropped into the 70s when she was working with physical therapy. Per the ED's note she had episode of left-sided chest pain while in physical therapy, but denies any chest pain at the moment.  No nausea, vomiting, fevers.  ED Course: Labs, EKG, CXR, antibiotics.  Admission for pneumonia.   Hospital Course:  Active Problems:   HTN (hypertension)   Pneumonia   SIRS (systemic inflammatory response syndrome) (HCC)   HCAP (healthcare-associated pneumonia)   HFrEF (heart failure with reduced ejection fraction) (HCC)   Unspecified atrial fibrillation (HCC)   Leukocytosis   Recurrent pneumonia/sepsis on presentation:  Suspect aspiration Patient improved on Repeat MBS,diet advanced, but patient does has esophageal dysmotility, will need to situp during and after oral intake She is treated with cefepime in the hospital, she is discharged on augmentin.  PAF;  currently sinus  Continue ccb /betablocker/eliquis  HFrEF: EF 25-30% on 02/09/2017 echo. BNP is elevated, but lower than last value at ~1600.  Lasix held on admit secondary to presenting sepsis, lasix resumed at discharge Continue betablocker and losartan  Hypomagnesemia: replace mag  HLD: Continue atorvastatin as tolerated  Hx CVA with aphasia: Continue on eliquis for afib.   PVD: L abi/dopplers indicate severe reduction in arterial flow at rest vascular office note reviewed, conservative management for now  Chronic indwelling foley: unsure when  it was last placed, and when it was  started, SNF not able to provide detail, but per chart review, patient was from home prior to hospitalization for cva in 01/2017.  Foley changed on 10/18 prior to discharge, SNF to perform voiding trial, refer to urology if patient fail voiding trial.  DVT prophylaxis:Eliquis Code Status:DNR Family Communication:Pt in room, called niece twice, no answers, message left. Disposition Plan:SNF  Consultants:  none  Procedures:  modified swallow   Antimicrobials:                             Anti-infectives   Start   Dose/Rate Route Frequency Ordered Stop   03/14/17 1800  vancomycin (VANCOCIN) 500 mg in sodium chloride 0.9 % 100 mL IVPB   500 mg 100 mL/hr over 60 Minutes Intravenous Every 24 hours 03/13/17 1854    03/13/17 2000  ceFEPIme (MAXIPIME) 1 g in dextrose 5 % 50 mL IVPB   1 g 100 mL/hr over 30 Minutes Intravenous Every 12 hours 03/13/17 1745 03/21/17 2159   03/13/17 1900  vancomycin (VANCOCIN) IVPB 1000 mg/200 mL premix   1,000 mg 200 mL/hr over 60 Minutes Intravenous Once 03/13/17 1854 03/13/17 2239   03/13/17 1600  cefTRIAXone (ROCEPHIN) 1 g in dextrose 5 % 50 mL IVPB   1 g 100 mL/hr over 30 Minutes Intravenous Once 03/13/17 1548 03/13/17 1653   03/13/17 1600  azithromycin (ZITHROMAX) 500 mg in dextrose 5 % 250 mL IVPB   500 mg 250 mL/hr over 60 Minutes Intravenous Once      Discharge Exam: BP (!) 163/76 (BP Location: Right Arm)   Pulse 85   Temp 98.3 F (36.8 C) (Oral)   Resp 18   Ht 5' (1.524 m)   SpO2 93%   General: more alert, know she is in the hospital, not oriented to time, she looks comfortable Cardiovascular: RRR Respiratory: decreased right lower lobe, no wheezing, no rales, no rhonchi Ab: nontender, +bs Extremities: bilateral heal protectors on, no edema appreciated. Able to bend both knees but not able to lift against gravity  Discharge Instructions You were cared for by a hospitalist during your  hospital stay. If you have any questions about your discharge medications or the care you received while you were in the hospital after you are discharged, you can call the unit and asked to speak with the hospitalist on call if the hospitalist that took care of you is not available. Once you are discharged, your primary care physician will handle any further medical issues. Please note that NO REFILLS for any discharge medications will be authorized once you are discharged, as it is imperative that you return to your primary care physician (or establish a relationship with a primary care physician if you do not have one) for your aftercare needs so that they can reassess your need for medications and monitor your lab values.  Discharge Instructions    Diet - low sodium heart healthy    Complete by:  As directed    Increase activity slowly    Complete by:  As directed      Allergies as of 03/16/2017      Reactions   Aspirin    nosebleed      Medication List    STOP taking these medications   RESOURCE THICKENUP CLEAR Powd     TAKE these medications   acetaminophen 500 MG tablet Commonly known as:  TYLENOL Take  1 tablet (500 mg total) by mouth every 6 (six) hours as needed for mild pain. What changed:  reasons to take this   amoxicillin-clavulanate 875-125 MG tablet Commonly known as:  AUGMENTIN Take 1 tablet by mouth every 12 (twelve) hours.   apixaban 2.5 MG Tabs tablet Commonly known as:  ELIQUIS Take 1 tablet (2.5 mg total) by mouth 2 (two) times daily.   atorvastatin 40 MG tablet Commonly known as:  LIPITOR Take 1 tablet (40 mg total) by mouth daily at 6 PM.   CORICIDIN 2-325 MG Tabs Generic drug:  Chlorpheniramine-APAP Take 1-2 tablets by mouth at bedtime as needed (for cough).   diltiazem 30 MG tablet Commonly known as:  CARDIZEM Take 1 tablet (30 mg total) by mouth every 6 (six) hours.   feeding supplement (ENSURE ENLIVE) Liqd Take 237 mLs by mouth daily. What  changed:  Another medication with the same name was removed. Continue taking this medication, and follow the directions you see here.   feeding supplement (PRO-STAT SUGAR FREE 64) Liqd Take 30 mLs by mouth 2 (two) times daily.   furosemide 20 MG tablet Commonly known as:  LASIX Take 1 tablet (20 mg total) by mouth daily.   gabapentin 250 MG/5ML solution Commonly known as:  NEURONTIN Take 6 mLs (300 mg total) by mouth at bedtime.   losartan 25 MG tablet Commonly known as:  COZAAR Take 1 tablet (25 mg total) by mouth daily.   Magnesium Oxide 400 MG Caps Take 1 capsule (400 mg total) by mouth daily.   metoprolol tartrate 25 MG tablet Commonly known as:  LOPRESSOR Take 25 mg by mouth 2 (two) times daily.   multivitamin with minerals Tabs tablet Take 1 tablet by mouth daily.   nitroGLYCERIN 0.4 MG SL tablet Commonly known as:  NITROSTAT Place 1 tablet (0.4 mg total) under the tongue every 5 (five) minutes x 3 doses as needed for chest pain.      Allergies  Allergen Reactions  . Aspirin     nosebleed      The results of significant diagnostics from this hospitalization (including imaging, microbiology, ancillary and laboratory) are listed below for reference.    Significant Diagnostic Studies: Ct Angio Head W Or Wo Contrast  Result Date: 02/15/2017 CLINICAL DATA:  83 year old female with left side weakness and leftward gaze deviation. EXAM: CT ANGIOGRAPHY HEAD AND NECK TECHNIQUE: Multidetector CT imaging of the head and neck was performed using the standard protocol during bolus administration of intravenous contrast. Multiplanar CT image reconstructions and MIPs were obtained to evaluate the vascular anatomy. Carotid stenosis measurements (when applicable) are obtained utilizing NASCET criteria, using the distal internal carotid diameter as the denominator. CONTRAST:  100 mL Isovue 370 COMPARISON:  Head CT without contrast 1016 hours today. FINDINGS: CTA NECK Skeleton:  Osteopenia. Intermittent cervical spine posterior element ankylosis and facet degeneration. No acute osseous abnormality identified. Upper chest: Layering pleural effusions, moderate to large on the right and small to moderate on the left. Some superimposed patchy nonspecific peripheral right upper lobe opacity. Probable underlying centrilobular emphysema. No superior mediastinal lymphadenopathy. Other neck: Negative.  No cervical lymphadenopathy. Aortic arch: The entire aortic arch is not included. Mild visible arch calcified atherosclerosis. Visible proximal great vessels are patent without stenosis despite mild plaque. Right carotid system: Negative visible brachiocephalic artery and right CCA origin. Tortuous right CCA. Negative right carotid bifurcation. Negative cervical right ICA aside from tortuosity. Left carotid system: No proximal left CCA stenosis despite mild plaque.  Minimal calcified plaque at the left carotid bifurcation affecting the ECA. Negative cervical left ICA aside from tortuosity. Vertebral arteries:No proximal subclavian artery stenosis with only mild plaque. Both vertebral artery origins are normal, the left is mildly dominant. Both vertebral arteries are patent to the skullbase with no cervical vertebral stenosis. CTA HEAD Posterior circulation: The distal left vertebral artery is dominant and primarily supplies the basilar. Patent left PICA origin. No distal left vertebral stenosis. The non dominant right vertebral artery functionally terminates in the PICA. No basilar artery stenosis. AICA and SCA origins are patent. Fetal type left PCA origin. Right posterior communicating artery is also present. No proximal PCA stenosis. No proximal left PCA stenosis and normal left PCA branches. There is mild to moderate right PCA distal P1 segment stenosis (series 12, image 22). Right PCA branches appear normal. Anterior circulation: Both ICA siphons are patent with only minimal calcified plaque.  Ophthalmic and posterior communicating artery origins are normal. Patent carotid termini. Normal MCA and ACA origins. Anterior communicating artery and bilateral ACA branches appear normal. Left MCA M1 segment, left MCA bifurcation, and left MCA branches are within normal limits. Right MCA M1 segment, right MCA bifurcation, and right MCA branches are within normal limits. Venous sinuses: Patent on the delayed images. Anatomic variants: Dominant left vertebral artery which primarily supplies the basilar. Fetal type left PCA origin. Delayed phase: No abnormal enhancement identified. Stable gray-white matter differentiation throughout the brain. no acute cortically based infarct or intracranial hemorrhage identified. Review of the MIP images confirms the above findings IMPRESSION: 1. Negative for emergent large vessel occlusion, normal for age anterior circulation, and negative for arterial stenosis in the neck. 2. There is a mild to moderate stenosis of the right PCA distal P1 segment. Otherwise the posterior circulation is negative. 3. Stable CT appearance of the brain since 1016 hours today. 4. Bilateral layering pleural effusions, moderate or large on the right. This study was preliminarily discussed by telephone with Dr. Rose Phi on 02/15/2017 at 1135 hours. Electronically Signed   By: Odessa Fleming M.D.   On: 02/15/2017 11:42   Dg Chest 2 View  Result Date: 03/13/2017 CLINICAL DATA:  Shortness of breath. EXAM: CHEST  2 VIEW COMPARISON:  02/16/2017. FINDINGS: Motion degraded exam. Enlarged cardiomediastinal silhouette. Calcified tortuous aorta. Asymmetric opacity at the RIGHT base, pleural effusion, in conjunction with probable consolidation worrisome for RIGHT lower lobe pneumonia. Superimposed RIGHT basilar atelectasis is likely. Asymmetric edema is possible but less favored. Streaky opacities in the RIGHT upper lobe also worse from priors, but less consolidative. No definite pneumothorax. Small LEFT effusion  is stable. Skeletal osteopenia. IMPRESSION: Concern for new RIGHT lower lobe infiltrate, atelectasis, and effusion. Electronically Signed   By: Elsie Stain M.D.   On: 03/13/2017 14:08   Ct Angio Neck W Or Wo Contrast  Result Date: 02/15/2017 CLINICAL DATA:  83 year old female with left side weakness and leftward gaze deviation. EXAM: CT ANGIOGRAPHY HEAD AND NECK TECHNIQUE: Multidetector CT imaging of the head and neck was performed using the standard protocol during bolus administration of intravenous contrast. Multiplanar CT image reconstructions and MIPs were obtained to evaluate the vascular anatomy. Carotid stenosis measurements (when applicable) are obtained utilizing NASCET criteria, using the distal internal carotid diameter as the denominator. CONTRAST:  100 mL Isovue 370 COMPARISON:  Head CT without contrast 1016 hours today. FINDINGS: CTA NECK Skeleton: Osteopenia. Intermittent cervical spine posterior element ankylosis and facet degeneration. No acute osseous abnormality identified. Upper chest: Layering pleural  effusions, moderate to large on the right and small to moderate on the left. Some superimposed patchy nonspecific peripheral right upper lobe opacity. Probable underlying centrilobular emphysema. No superior mediastinal lymphadenopathy. Other neck: Negative.  No cervical lymphadenopathy. Aortic arch: The entire aortic arch is not included. Mild visible arch calcified atherosclerosis. Visible proximal great vessels are patent without stenosis despite mild plaque. Right carotid system: Negative visible brachiocephalic artery and right CCA origin. Tortuous right CCA. Negative right carotid bifurcation. Negative cervical right ICA aside from tortuosity. Left carotid system: No proximal left CCA stenosis despite mild plaque. Minimal calcified plaque at the left carotid bifurcation affecting the ECA. Negative cervical left ICA aside from tortuosity. Vertebral arteries:No proximal subclavian artery  stenosis with only mild plaque. Both vertebral artery origins are normal, the left is mildly dominant. Both vertebral arteries are patent to the skullbase with no cervical vertebral stenosis. CTA HEAD Posterior circulation: The distal left vertebral artery is dominant and primarily supplies the basilar. Patent left PICA origin. No distal left vertebral stenosis. The non dominant right vertebral artery functionally terminates in the PICA. No basilar artery stenosis. AICA and SCA origins are patent. Fetal type left PCA origin. Right posterior communicating artery is also present. No proximal PCA stenosis. No proximal left PCA stenosis and normal left PCA branches. There is mild to moderate right PCA distal P1 segment stenosis (series 12, image 22). Right PCA branches appear normal. Anterior circulation: Both ICA siphons are patent with only minimal calcified plaque. Ophthalmic and posterior communicating artery origins are normal. Patent carotid termini. Normal MCA and ACA origins. Anterior communicating artery and bilateral ACA branches appear normal. Left MCA M1 segment, left MCA bifurcation, and left MCA branches are within normal limits. Right MCA M1 segment, right MCA bifurcation, and right MCA branches are within normal limits. Venous sinuses: Patent on the delayed images. Anatomic variants: Dominant left vertebral artery which primarily supplies the basilar. Fetal type left PCA origin. Delayed phase: No abnormal enhancement identified. Stable gray-white matter differentiation throughout the brain. no acute cortically based infarct or intracranial hemorrhage identified. Review of the MIP images confirms the above findings IMPRESSION: 1. Negative for emergent large vessel occlusion, normal for age anterior circulation, and negative for arterial stenosis in the neck. 2. There is a mild to moderate stenosis of the right PCA distal P1 segment. Otherwise the posterior circulation is negative. 3. Stable CT appearance  of the brain since 1016 hours today. 4. Bilateral layering pleural effusions, moderate or large on the right. This study was preliminarily discussed by telephone with Dr. Rose Phi on 02/15/2017 at 1135 hours. Electronically Signed   By: Odessa Fleming M.D.   On: 02/15/2017 11:42   Mr Brain Wo Contrast  Result Date: 02/15/2017 CLINICAL DATA:  Suspected acute stroke. Leftward gaze deviation. History of cardiac arrhythmia, atrial fibrillation. Aphasia. Generalized weakness. EXAM: MRI HEAD WITHOUT CONTRAST TECHNIQUE: Multiplanar, multiecho pulse sequences of the brain and surrounding structures were obtained without intravenous contrast. COMPARISON:  Noncontrast CT head along with CTA head neck earlier today. FINDINGS: The patient was unable to remain motionless for the exam. Small or subtle lesions could be overlooked. Brain: Multifocal areas of restricted diffusion seen throughout both cerebral hemispheres and the RIGHT cerebellar hemisphere consistent with a shower of emboli. The largest number of lesions can be seen in the LEFT hemisphere, involving the frontal, temporal, parietal, occipital, and insular regions as well as the basal ganglia. Most of these are punctate/focal in nature although confluent restricted diffusion  can be seen in the posterior LEFT parietal cortex. Smaller punctate lesions involve the RIGHT frontal cortex, centrum semiovale on the RIGHT, as well as the RIGHT cerebellar cortex and deep white matter. Within limits for detection on MR, no acute hemorrhage. There is advanced atrophy. Mild to moderate T2 and FLAIR hyperintensity throughout the deep white matter is noted, likely small vessel disease. Vascular: Flow voids are maintained. Skull and upper cervical spine: Normal marrow signal. Sinuses/Orbits: Chronic changes in the RIGHT maxillary sinus, prior surgery. No layering fluid. Negative orbits. Other: Trace mastoid effusion on the LEFT. IMPRESSION: Widespread multifocal areas of acute  infarction, nonhemorrhagic, throughout both cerebral hemispheres, as well as the RIGHT cerebellar hemisphere, most consistent with a shower of emboli. The dominant hemispheric involvement is within the LEFT ICA/MCA territory. Electronically Signed   By: Elsie Stain M.D.   On: 02/15/2017 12:37   Dg Chest Port 1 View  Result Date: 02/16/2017 CLINICAL DATA:  PICC placement. EXAM: PORTABLE CHEST 1 VIEW COMPARISON:  Radiographs of February 16, 2017. FINDINGS: Stable cardiomegaly and central pulmonary vascular congestion. Atherosclerosis of thoracic aorta is noted. Mild bibasilar edema or atelectasis is noted. Mild bilateral pleural effusions are noted. No pneumothorax is noted. Bony thorax is unremarkable. Interval placement of left-sided PICC line is noted with distal tip in expected position of the SVC. IMPRESSION: Interval placement of left-sided PICC line with distal tip in expected position of SVC. Stable cardiomegaly and central pulmonary vascular congestion, with probable bibasilar edema or atelectasis. Mild bilateral pleural effusions are noted. Electronically Signed   By: Lupita Raider, M.D.   On: 02/16/2017 14:52   Dg Chest Port 1 View  Result Date: 02/16/2017 CLINICAL DATA:  Follow-up pleural effusion. EXAM: PORTABLE CHEST 1 VIEW COMPARISON:  02/08/2017 FINDINGS: Patient is moderately rotated to the left. Lungs are adequately inflated and demonstrate minimal hazy bibasilar density likely small effusions without significant change. Interval improvement in mild opacification of the right perihilar region. Cardiomediastinal silhouette and remainder of the exam is unchanged. IMPRESSION: Improving right perihilar airspace process. Subtle hazy bibasilar density likely small effusions without significant change. Electronically Signed   By: Elberta Fortis M.D.   On: 02/16/2017 08:40   Dg Swallowing Func-speech Pathology  Result Date: 03/16/2017 Objective Swallowing Evaluation: Type of Study:  MBS-Modified Barium Swallow Study Patient Details Name: Rachel [name] MRN: [id number] Date of Birth: 1930-06-04 Today's Date: 03/16/2017 Time: SLP Start Time (ACUTE ONLY): 0810-SLP Stop Time (ACUTE ONLY): 0835 SLP Time Calculation (min) (ACUTE ONLY): 25 min Past Medical History: Past Medical History: Diagnosis Date . Aphasia  . Arthritis   OA . Atrial fibrillation (HCC)  . Fracture of femoral neck (HCC) 02/11/2016  LEFT . Heart failure with reduced ejection fraction and diastolic dysfunction (HCC)  . Hypertension  . Pleural effusion  . Pneumonia  . Stroke [facility])  Past Surgical History: Past Surgical History: Procedure Laterality Date . APPENDECTOMY   . BREAST SURGERY    BIOPSY . HIP ARTHROPLASTY Left 02/12/2016  Procedure: ARTHROPLASTY BIPOLAR HIP (HEMIARTHROPLASTY);  Surgeon: [name], MD;  Location: [facility] OR;  Service: Orthopedics;  Laterality: Left; HPI: 83 yo female admitted with A fib, Pna, CHF. Hx of L hip hemi-posterior 2017, OA, arrythmia, HTN.  Code Stroke was called during admission.  Pt found to have widespread acute infarcts.  She had dysphagia and required modified diet of dys2/nectar.  Pt found to have right lower lobe pna at time of strokes and now readmitted with aspiration pna.  MBS indicated as swallow should be improved with spontaneous recovery and tx.  Subjective: pt alert in chair Assessment / Plan / Recommendation CHL IP CLINICAL IMPRESSIONS 03/16/2017 Clinical Impression Pt with MUCH improved oropharyngeal swallow ability compared to prior MBS.  She demonstrates much improved oral control - no aspiration or penetration observed across all consistencies.  She did have coordination difficulties with pill/pudding requiring expectoration with initial trial.  Pt later able to swallow tablet with thin when SLP placed it posterior in oral cavity.  Decreased tongue base retraction does result in mild - mod pharyngeal resdiuals without adequate sensation.  Cues for strong swallow prevents some  residuals.  Unfortunately pt unable to dry swallow or "hock" on command likely due to her motor planning deficits from CVAs.  Recommend advance diet to dys3/thin with precautions.  Pt unable to have esophagram due to concerns for aspiration with oral control issues, Radiologist Dr Margo Aye nicely examined esophagus during MBS  - limited view however he stated pt without evidence of stricture and appears with motility deficits.  Pt noted to clear her throat frequently during tesing without barium visualized in larynx and she pointed to her facial sinuses to indicate area of discomfort - ? if this could contribute to aspiration pnas.   SLP Visit Diagnosis Dysphagia, oropharyngeal phase (R13.12) Attention and concentration deficit following -- Frontal lobe and executive function deficit following -- Impact on safety and function Mild aspiration risk   CHL IP TREATMENT RECOMMENDATION 02/17/2017 Treatment Recommendations Therapy as outlined in treatment plan below   Prognosis 03/16/2017 Prognosis for Safe Diet Advancement Good Barriers to Reach Goals Language deficits Barriers/Prognosis Comment -- CHL IP DIET RECOMMENDATION 03/16/2017 SLP Diet Recommendations Dysphagia 3 (Mech soft) solids;Thin liquid Liquid Administration via Cup;Straw Medication Administration Whole meds with puree Compensations Slow rate;Small sips/bites;Follow solids with liquid;Other (Comment);Effortful swallow Postural Changes Remain semi-upright after after feeds/meals (Comment);Seated upright at 90 degrees   CHL IP OTHER RECOMMENDATIONS 03/16/2017 Recommended Consults -- Oral Care Recommendations Oral care BID Other Recommendations --   CHL IP FOLLOW UP RECOMMENDATIONS 03/15/2017 Follow up Recommendations Skilled Nursing facility   [facility] IP FREQUENCY AND DURATION 03/16/2017 Speech Therapy Frequency (ACUTE ONLY) min 2x/week Treatment Duration 1 week      CHL IP ORAL PHASE 03/16/2017 Oral Phase -- Oral - Pudding Teaspoon -- Oral - Pudding Cup -- Oral -  Honey Teaspoon -- Oral - Honey Cup -- Oral - Nectar Teaspoon -- Oral - Nectar Cup WFL Oral - Nectar Straw NT Oral - Thin Teaspoon WFL Oral - Thin Cup WFL Oral - Thin Straw WFL Oral - Puree Delayed oral transit;Premature spillage;Reduced posterior propulsion Oral - Mech Soft NT Oral - Regular Delayed oral transit;Impaired mastication Oral - Multi-Consistency -- Oral - Pill Decreased bolus cohesion;Reduced posterior propulsion;Holding of bolus;Delayed oral transit Oral Phase - Comment --  CHL IP PHARYNGEAL PHASE 03/16/2017 Pharyngeal Phase Impaired Pharyngeal- Pudding Teaspoon -- Pharyngeal -- Pharyngeal- Pudding Cup -- Pharyngeal -- Pharyngeal- Honey Teaspoon -- Pharyngeal -- Pharyngeal- Honey Cup -- Pharyngeal -- Pharyngeal- Nectar Teaspoon -- Pharyngeal -- Pharyngeal- Nectar Cup Reduced epiglottic inversion;Pharyngeal residue - valleculae Pharyngeal -- Pharyngeal- Nectar Straw Reduced epiglottic inversion;Pharyngeal residue - valleculae Pharyngeal -- Pharyngeal- Thin Teaspoon Reduced epiglottic inversion;Pharyngeal residue - valleculae Pharyngeal -- Pharyngeal- Thin Cup Reduced epiglottic inversion;Pharyngeal residue - valleculae Pharyngeal Material does not enter airway Pharyngeal- Thin Straw Reduced epiglottic inversion;Pharyngeal residue - valleculae Pharyngeal -- Pharyngeal- Puree Reduced epiglottic inversion;Pharyngeal residue - valleculae;Delayed swallow initiation-vallecula Pharyngeal -- Pharyngeal- Mechanical Soft NT Pharyngeal --  Pharyngeal- Regular Reduced epiglottic inversion;Pharyngeal residue - valleculae;Delayed swallow initiation-vallecula Pharyngeal -- Pharyngeal- Multi-consistency -- Pharyngeal -- Pharyngeal- Pill Reduced epiglottic inversion;Pharyngeal residue - valleculae Pharyngeal -- Pharyngeal Comment pt without sensation to mild residuals and cues to "hock" or dry swallow not effective due to pt motor planning difficulties - cued hard swallow with bolus helpful to prevent some residuals  CHL  IP CERVICAL ESOPHAGEAL PHASE 03/16/2017 Cervical Esophageal Phase WFL Pudding Teaspoon -- Pudding Cup -- Honey Teaspoon -- Honey Cup -- Nectar Teaspoon -- Nectar Cup -- Nectar Straw -- Thin Teaspoon -- Thin Cup -- Thin Straw -- Puree -- Mechanical Soft -- Regular -- Multi-consistency -- Pill -- Cervical Esophageal Comment -- No flowsheet data found. Chales Abrahams 03/16/2017, 10:41 AM  Donavan Burnet, MS [facility] SLP [phone]             Dg Swallowing Func-speech Pathology  Result Date: 03/15/2017 Objective Swallowing Evaluation: Type of Study: MBS-Modified Barium Swallow Study Patient Details Name: Rachel [name] MRN: [id number] Date of Birth: 25-May-1931 Today's Date: 03/15/2017 Time: SLP Start Time (ACUTE ONLY): 0932-SLP Stop Time (ACUTE ONLY): 0950 SLP Time Calculation (min) (ACUTE ONLY): 18 min Past Medical History: Past Medical History: Diagnosis Date . Aphasia  . Arthritis   OA . Atrial fibrillation (HCC)  . Fracture of femoral neck (HCC) 02/11/2016  LEFT . Heart failure with reduced ejection fraction and diastolic dysfunction (HCC)  . Hypertension  . Pleural effusion  . Pneumonia  . Stroke [facility])  Past Surgical History: Past Surgical History: Procedure Laterality Date . APPENDECTOMY   . BREAST SURGERY    BIOPSY . HIP ARTHROPLASTY Left 02/12/2016  Procedure: ARTHROPLASTY BIPOLAR HIP (HEMIARTHROPLASTY);  Surgeon: [name], MD;  Location: [facility] OR;  Service: Orthopedics;  Laterality: Left; HPI: 83 yo female admitted with A fib, Pna, CHF. Hx of L hip hemi-posterior 2017, OA, arrythmia, HTN.  Code Stroke was called during admission.  Pt found to have widespread acute infarcts Subjective: pt alert, aphasic/dysarthric Assessment / Plan / Recommendation CHL IP CLINICAL IMPRESSIONS 02/20/2017 Clinical Impression Pt has a mild-moderate oropharyngeal dysphagia with suspected esophageal component. SLP provided Min cues for mastication and extra time for posterior transit with all consistencies. She has premature  spillage of thin liquids that results in silent aspiration with larger boluses. Even smaller tsp amounts are silently penetrated. She has better oral containment with purees and solids to allow for improved airway protection. Her pharyngeal swallow triggers swiftly but with reduced hyolaryngeal movement and base of tongue retraction that leaves mild vallecular residue behind. Her esophagus appears dilated with decreased clearance and retrograde flow of barium, although MD was not present to confirm. Recommend Dys 2 diet given current cognitive-linguistic function as well as oropharyngeal and esopghageal issues noted above. Would also start with nectar thick liquids to increase safety. Will continue to follow for potential to advance to thin liquids even once pt may be able to follow commands more consistently.   SLP Visit Diagnosis Dysphagia, oropharyngeal phase (R13.12) Attention and concentration deficit following -- Frontal lobe and executive function deficit following -- Impact on safety and function --   CHL IP TREATMENT RECOMMENDATION 02/17/2017 Treatment Recommendations Therapy as outlined in treatment plan below   Prognosis 02/17/2017 Prognosis for Safe Diet Advancement Good Barriers to Reach Goals Cognitive deficits;Language deficits Barriers/Prognosis Comment -- CHL IP DIET RECOMMENDATION 02/20/2017 SLP Diet Recommendations Dysphagia 2 (Fine chop) solids; Nectar thick liquid Liquid Administration via Cup; Straw Medication Administration Crushed with puree Compensations Slow rate;Small sips/bites;Follow  solids with liquid Postural Changes Seated upright at 90 degrees;Remain semi-upright after after feeds/meals   CHL IP OTHER RECOMMENDATIONS 02/17/2017 Recommended Consults -- Oral Care Recommendations Oral care BID Other Recommendations Order thickener from pharmacy;Prohibited food (jello, ice cream, thin soups);Remove water pitcher   CHL IP FOLLOW UP RECOMMENDATIONS 02/20/2017 Follow up Recommendations Skilled  Nursing facility   [facility] IP FREQUENCY AND DURATION 02/17/2017 Speech Therapy Frequency (ACUTE ONLY) min 2x/week Treatment Duration 2 weeks      CHL IP ORAL PHASE 02/17/2017 Oral Phase Impaired Oral - Pudding Teaspoon -- Oral - Pudding Cup -- Oral - Honey Teaspoon -- Oral - Honey Cup -- Oral - Nectar Teaspoon -- Oral - Nectar Cup Delayed oral transit;Reduced posterior propulsion;Lingual/palatal residue Oral - Nectar Straw Delayed oral transit;Reduced posterior propulsion;Lingual/palatal residue Oral - Thin Teaspoon Delayed oral transit;Reduced posterior propulsion;Lingual/palatal residue;Premature spillage Oral - Thin Cup Delayed oral transit;Reduced posterior propulsion;Lingual/palatal residue;Premature spillage Oral - Thin Straw -- Oral - Puree Delayed oral transit;Reduced posterior propulsion;Lingual/palatal residue Oral - Mech Soft Delayed oral transit;Reduced posterior propulsion;Lingual/palatal residue;Impaired mastication Oral - Regular -- Oral - Multi-Consistency -- Oral - Pill Delayed oral transit;Reduced posterior propulsion;Other (Comment) Oral Phase - Comment --  CHL IP PHARYNGEAL PHASE 02/17/2017 Pharyngeal Phase Impaired Pharyngeal- Pudding Teaspoon -- Pharyngeal -- Pharyngeal- Pudding Cup -- Pharyngeal -- Pharyngeal- Honey Teaspoon -- Pharyngeal -- Pharyngeal- Honey Cup -- Pharyngeal -- Pharyngeal- Nectar Teaspoon -- Pharyngeal -- Pharyngeal- Nectar Cup Reduced anterior laryngeal mobility;Reduced laryngeal elevation;Reduced tongue base retraction;Pharyngeal residue - valleculae Pharyngeal -- Pharyngeal- Nectar Straw Reduced anterior laryngeal mobility;Reduced laryngeal elevation;Reduced tongue base retraction;Pharyngeal residue - valleculae;Penetration/Aspiration during swallow Pharyngeal Material enters airway, remains ABOVE vocal cords then ejected out Pharyngeal- Thin Teaspoon Reduced anterior laryngeal mobility;Reduced laryngeal elevation;Reduced tongue base retraction;Pharyngeal residue -  valleculae;Penetration/Aspiration before swallow Pharyngeal Material enters airway, remains ABOVE vocal cords and not ejected out Pharyngeal- Thin Cup Reduced anterior laryngeal mobility;Reduced laryngeal elevation;Reduced tongue base retraction;Pharyngeal residue - valleculae;Penetration/Aspiration before swallow Pharyngeal Material enters airway, passes BELOW cords without attempt by patient to eject out (silent aspiration) Pharyngeal- Thin Straw -- Pharyngeal -- Pharyngeal- Puree Reduced anterior laryngeal mobility;Reduced laryngeal elevation;Reduced tongue base retraction;Pharyngeal residue - valleculae Pharyngeal -- Pharyngeal- Mechanical Soft Reduced anterior laryngeal mobility;Reduced laryngeal elevation;Reduced tongue base retraction;Pharyngeal residue - valleculae Pharyngeal -- Pharyngeal- Regular -- Pharyngeal -- Pharyngeal- Multi-consistency -- Pharyngeal -- Pharyngeal- Pill -- Pharyngeal -- Pharyngeal Comment --  CHL IP CERVICAL ESOPHAGEAL PHASE 02/17/2017 Cervical Esophageal Phase WFL Pudding Teaspoon -- Pudding Cup -- Honey Teaspoon -- Honey Cup -- Nectar Teaspoon -- Nectar Cup -- Nectar Straw -- Thin Teaspoon -- Thin Cup -- Thin Straw -- Puree -- Mechanical Soft -- Regular -- Multi-consistency -- Pill -- Cervical Esophageal Comment -- No flowsheet data found. Maxcine Hamaiewonsky, Laura 03/15/2017, 11:55 AM  Maxcine HamLaura Paiewonsky, M.A. CCC-SLP [phone]             Ct Head Code Stroke Wo Contrast  Result Date: 02/15/2017 CLINICAL DATA:  Code stroke. LEFT-sided weakness. Head leaning to LEFT. Evaluate for stroke. Aphasia. LEFT-sided gaze preference. EXAM: CT HEAD WITHOUT CONTRAST TECHNIQUE: Contiguous axial images were obtained from the base of the skull through the vertex without intravenous contrast. COMPARISON:  02/11/2016. FINDINGS: Brain: No evidence for acute infarction, hemorrhage, mass lesion, hydrocephalus, or extra-axial fluid. Moderately advanced atrophy. Hypoattenuation of white matter suggesting  small vessel disease. Chronic RIGHT cerebellar infarct. Tiny chronic LEFT caudate lacunar infarct. Vascular: Calcification of the internal carotid arteries and distal LEFT vertebral artery, consistent with cerebrovascular atherosclerotic disease. No  signs of intracranial large vessel occlusion. Skull: Normal. Negative for fracture or focal lesion. Sinuses/Orbits: No acute finding. Other: Compared with priors, similar appearance. ASPECTS [facility] Stroke Program Early CT Score) - Ganglionic level infarction (caudate, lentiform nuclei, internal capsule, insula, M1-M3 cortex): 7 - Supraganglionic infarction (M4-M6 cortex): 3 Total score (0-10 with 10 being normal): 10 IMPRESSION: 1. Atrophy and small vessel disease. No acute cortical infarction or large vessel occlusion is evident. 2. ASPECTS is 10. These results were called by telephone at the time of interpretation on 02/15/2017 at 10:35 am to Dr. Rose Phi , who verbally acknowledged these results. Electronically Signed   By: Elsie Stain M.D.   On: 02/15/2017 10:38    Microbiology: Recent Results (from the past 240 hour(s))  Culture, blood (routine x 2) Call MD if unable to obtain prior to antibiotics being given     Status: None (Preliminary result)   Collection Time: 03/13/17  9:08 PM  Result Value Ref Range Status   Specimen Description BLOOD BLOOD LEFT ARM  Final   Special Requests IN PEDIATRIC BOTTLE Blood Culture adequate volume  Final   Culture   Final    NO GROWTH 2 DAYS Performed at [facility], [address], [place], [place] [address]    Report Status PENDING  Incomplete  Culture, blood (routine x 2) Call MD if unable to obtain prior to antibiotics being given     Status: None (Preliminary result)   Collection Time: 03/13/17  9:08 PM  Result Value Ref Range Status   Specimen Description BLOOD BLOOD LEFT HAND  Final   Special Requests IN PEDIATRIC BOTTLE Blood Culture adequate volume  Final   Culture   Final    NO GROWTH 2  DAYS Performed at [facility], [address], [place], [place] [address]    Report Status PENDING  Incomplete  MRSA PCR Screening     Status: None   Collection Time: 03/14/17  6:00 AM  Result Value Ref Range Status   MRSA by PCR NEGATIVE NEGATIVE Final    Comment:        The GeneXpert MRSA Assay (FDA approved for NASAL specimens only), is one component of a comprehensive MRSA colonization surveillance program. It is not intended to diagnose MRSA infection nor to guide or monitor treatment for MRSA infections.      Labs: Basic Metabolic Panel:  Recent Labs Lab 03/13/17 1404 03/14/17 0152 03/15/17 0604 03/16/17 0624  NA 137 136 138 137  K 3.6 3.6 4.3 3.9  CL 96* 100* 105 102  CO2 30 27 26 26   GLUCOSE 99 110* 87 93  BUN 24* 27* 33* 28*  CREATININE 0.57 0.43* 0.43* 0.37*  CALCIUM 9.0 8.6* 8.6* 8.4*  MG  --   --   --  1.6*   Liver Function Tests: No results for input(s): AST, ALT, ALKPHOS, BILITOT, PROT, ALBUMIN in the last 168 hours. No results for input(s): LIPASE, AMYLASE in the last 168 hours. No results for input(s): AMMONIA in the last 168 hours. CBC:  Recent Labs Lab 03/13/17 1404 03/14/17 0152 03/16/17 0624  WBC 16.5* 10.6* 9.0  NEUTROABS  --   --  6.5  HGB 16.0* 13.7 13.5  HCT 48.6* 42.0 41.8  MCV 82.7 82.8 83.8  PLT 258 213 216   Cardiac Enzymes:  Recent Labs Lab 03/13/17 2108 03/14/17 0152 03/14/17 0828  TROPONINI <0.03 <0.03 <0.03   BNP: BNP (last 3 results)  Recent Labs  02/08/17 2043 03/13/17 1404  BNP 3,637.0* 1,651.7*    ProBNP (last 3 results) No results for input(s): PROBNP in the last 8760 hours.  CBG:  Recent Labs Lab 03/14/17 0725 03/15/17 0751 03/16/17 0857  GLUCAP 83 117* 133*       SignedAlbertine Grates MD, PhD  [facility] 03/16/2017, 11:33 AM

## 2017-03-16 NOTE — Progress Notes (Signed)
Pt informed of need for MBS and potential esophagram and plan for this am.   Donavan Burnet, MS [facility] SLP [phone]

## 2017-03-16 NOTE — Progress Notes (Signed)
March 16, 2017 Chart and discharge orders researched for Case Management needs. None found and patient discharged to appropriate level of care. Patient and family have no further questions. Lucindy Borel, BSN, RN3, CCM [phone]. 

## 2017-03-16 NOTE — Progress Notes (Signed)
Inserted 20F foley catheter with nursing students (uncg). Patient tolerated well. Urine returned. Catheter secured to leg. Z. Tiburcio Pea, RN Clinical Instructor Haroldine Laws

## 2017-03-16 NOTE — Progress Notes (Signed)
Patient medically stable for DC back to [facility]. Facility aware of return. All DC documents sent. Left message for niece, regarding DC and plans. Awaiting call back.  Patient will transport by [facility].   Beulah Gandy [place] CSW [phone]

## 2017-03-16 NOTE — NC FL2 (Signed)
[place] MEDICAID FL2 LEVEL OF CARE SCREENING TOOL     IDENTIFICATION  Patient Name: Rachel FinnerShirley M Scoville Birthdate: 10/03/1930 Sex: female Admission Date (Current Location): 03/13/2017  [facility] [place]   Facility and Address:  [facility],  [address], [place] [id number]      Provider Number: [id number]  Attending Physician Name and Address:  Albertine GratesXu, Shalawn Wynder, MD  Relative Name and Phone Number:       Current Level of Care: Hospital Recommended Level of Care: Skilled Nursing Facility Prior Approval Number:    Date Approved/Denied:   PASRR Number: [id number]  Discharge Plan: SNF    Current Diagnoses: Patient Active Problem List   Diagnosis Date Noted  . Pneumonia 03/13/2017  . SIRS (systemic inflammatory response syndrome) (HCC) 03/13/2017  . HCAP (healthcare-associated pneumonia) 03/13/2017  . HFrEF (heart failure with reduced ejection fraction) (HCC) 03/13/2017  . Unspecified atrial fibrillation (HCC) 03/13/2017  . Leukocytosis 03/13/2017  . Pleural effusion   . Thrombus of left atrial appendage without antecedent myocardial infarction   . Pressure injury of skin 02/17/2017  . Stroke (HCC) 02/15/2017  . Malnutrition of moderate degree 02/10/2017  . New onset atrial fibrillation (HCC) 02/08/2017  . CAP (community acquired pneumonia) 02/08/2017  . Renal insufficiency 02/08/2017  . NSTEMI (non-ST elevated myocardial infarction) (HCC) 02/08/2017  . Acute CHF (congestive heart failure) (HCC) 02/08/2017  . General weakness 02/08/2017  . Fall   . Displaced fracture of left femoral neck (HCC) 02/11/2016  . HTN (hypertension) 02/11/2016  . Dehydration, moderate 02/11/2016  . Elevated CPK 02/11/2016    Orientation RESPIRATION BLADDER Height & Weight     Self, Time  Normal Incontinent, Indwelling catheter Weight:   Height:  5' (152.4 cm)  BEHAVIORAL SYMPTOMS/MOOD NEUROLOGICAL BOWEL NUTRITION STATUS      Incontinent Diet SLP Diet  Recommendations: Dysphagia 3 (Mech soft) solids;Thin liquid (small frequent meals)  AMBULATORY STATUS COMMUNICATION OF NEEDS Skin   Extensive Assist Verbally PU Stage and Appropriate Care     Reason for Consult: left heel wound, also has left first toe wound, DTI's Wound type:pressure Pressure Injury POA: Yes Measurement:Left heel is 4cm x 4cm, distal part of left great toe 1.2cm x1.2cm Wound HQI:ONGEbed:deep purple Drainage (amount, consistency, odor) none Periwound:intact Dressing procedure/placement/frequency:I have provided nurses with orders for Leaving wounds open to air, place both feet in [employer] [place] # [phone]).                    Personal Care Assistance Level of Assistance  Bathing, Feeding, Dressing Bathing Assistance: Maximum assistance Feeding assistance: Limited assistance Dressing Assistance: Maximum assistance     Functional Limitations Info  Sight, Hearing Sight Info: Adequate Hearing Info: Adequate Speech Info: Adequate    SPECIAL CARE FACTORS FREQUENCY  PT (By licensed PT), OT (By licensed OT), Speech therapy     PT Frequency: 5x a week OT Frequency: 5x a week     Speech Therapy Frequency: 2x a week      Contractures Contractures Info: Not present    Additional Factors Info  Code Status, Allergies Code Status Info: DNR Allergies Info: Asprin           Current Medications (03/16/2017):  This is the current hospital active medication list Current Facility-Administered Medications  Medication Dose Route Frequency Provider Last Rate Last Dose  . 0.9 %  sodium chloride infusion   Intravenous Continuous Zigmund DanielPowell, A Caldwell Jr., MD 50 mL/hr at 03/14/17 2217    .  acetaminophen (TYLENOL) tablet 500 mg  500 mg Oral Q6H PRN Zigmund Daniel., MD      . amoxicillin-clavulanate (AUGMENTIN) 875-125 MG per tablet 1 tablet  1 tablet Oral Q12H Albertine Grates, MD   1 tablet at 03/16/17 1050  . apixaban (ELIQUIS) tablet 2.5 mg  2.5 mg Oral BID Zigmund Daniel., MD   2.5 mg at 03/16/17 1051  . atorvastatin (LIPITOR) tablet 40 mg  40 mg Oral q1800 Zigmund Daniel., MD   40 mg at 03/15/17 1724  . diltiazem (CARDIZEM) tablet 30 mg  30 mg Oral Q6H Zigmund Daniel., MD   30 mg at 03/16/17 0601  . feeding supplement (BOOST / RESOURCE BREEZE) liquid 1 Container  1 Container Oral BID BM Zigmund Daniel., MD   1 Container at 03/14/17 1332  . feeding supplement (PRO-STAT SUGAR FREE 64) liquid 30 mL  30 mL Oral BID Zigmund Daniel., MD   30 mL at 03/16/17 1000  . gabapentin (NEURONTIN) 250 MG/5ML solution 300 mg  300 mg Oral QHS Albertine Grates, MD      . losartan (COZAAR) tablet 25 mg  25 mg Oral Daily Zigmund Daniel., MD   25 mg at 03/16/17 1051  . magnesium sulfate IVPB 2 g 50 mL  2 g Intravenous Once Albertine Grates, MD 50 mL/hr at 03/16/17 1051 2 g at 03/16/17 1051  . metoprolol tartrate (LOPRESSOR) tablet 25 mg  25 mg Oral BID Zigmund Daniel., MD   25 mg at 03/16/17 1051  . RESOURCE THICKENUP CLEAR   Oral PRN Zigmund Daniel., MD         Discharge Medications: Please see discharge summary for a list of discharge medications.  Relevant Imaging Results:  Relevant Lab Results:   Additional Information SS#: [id number]  Raye Sorrow, LCSW

## 2017-03-18 LAB — CULTURE, BLOOD (ROUTINE X 2)
CULTURE: NO GROWTH
Culture: NO GROWTH
SPECIAL REQUESTS: ADEQUATE
SPECIAL REQUESTS: ADEQUATE

## 2017-03-21 ENCOUNTER — Other Ambulatory Visit: Payer: Self-pay | Admitting: *Deleted

## 2017-03-21 NOTE — Patient Outreach (Signed)
[facility]) Care Management  03/21/2017  Rachel [name] [birth year] [id number]   Spoke with [name], SW at facility. She reports that patient niece has taken over care for patient and wants to apply for LTC Medicaid for placement. Plan to sign off as no [name] care management needs at this time due to LTC placement.  Alben Spittle. Albertha Ghee, RN, BSN, CCM  Post Acute Chartered loss adjuster [phone]) Business Cell  [phone]) Toll Free Office

## 2017-04-12 ENCOUNTER — Encounter: Payer: Self-pay | Admitting: Neurology

## 2017-04-19 ENCOUNTER — Ambulatory Visit: Payer: Self-pay | Admitting: Neurology

## 2017-06-16 ENCOUNTER — Encounter (HOSPITAL_COMMUNITY): Payer: BLUE CROSS/BLUE SHIELD

## 2017-06-16 ENCOUNTER — Ambulatory Visit: Payer: Medicare Other | Admitting: Family

## 2018-02-01 IMAGING — DX DG CHEST 1V PORT
2 series · 2 of 2 positions shown · non-contrast
Comparison: 02/08/2017

CLINICAL DATA: Follow-up pleural effusion.

EXAM:
PORTABLE CHEST 1 VIEW

[chest ap (1 of 2)]
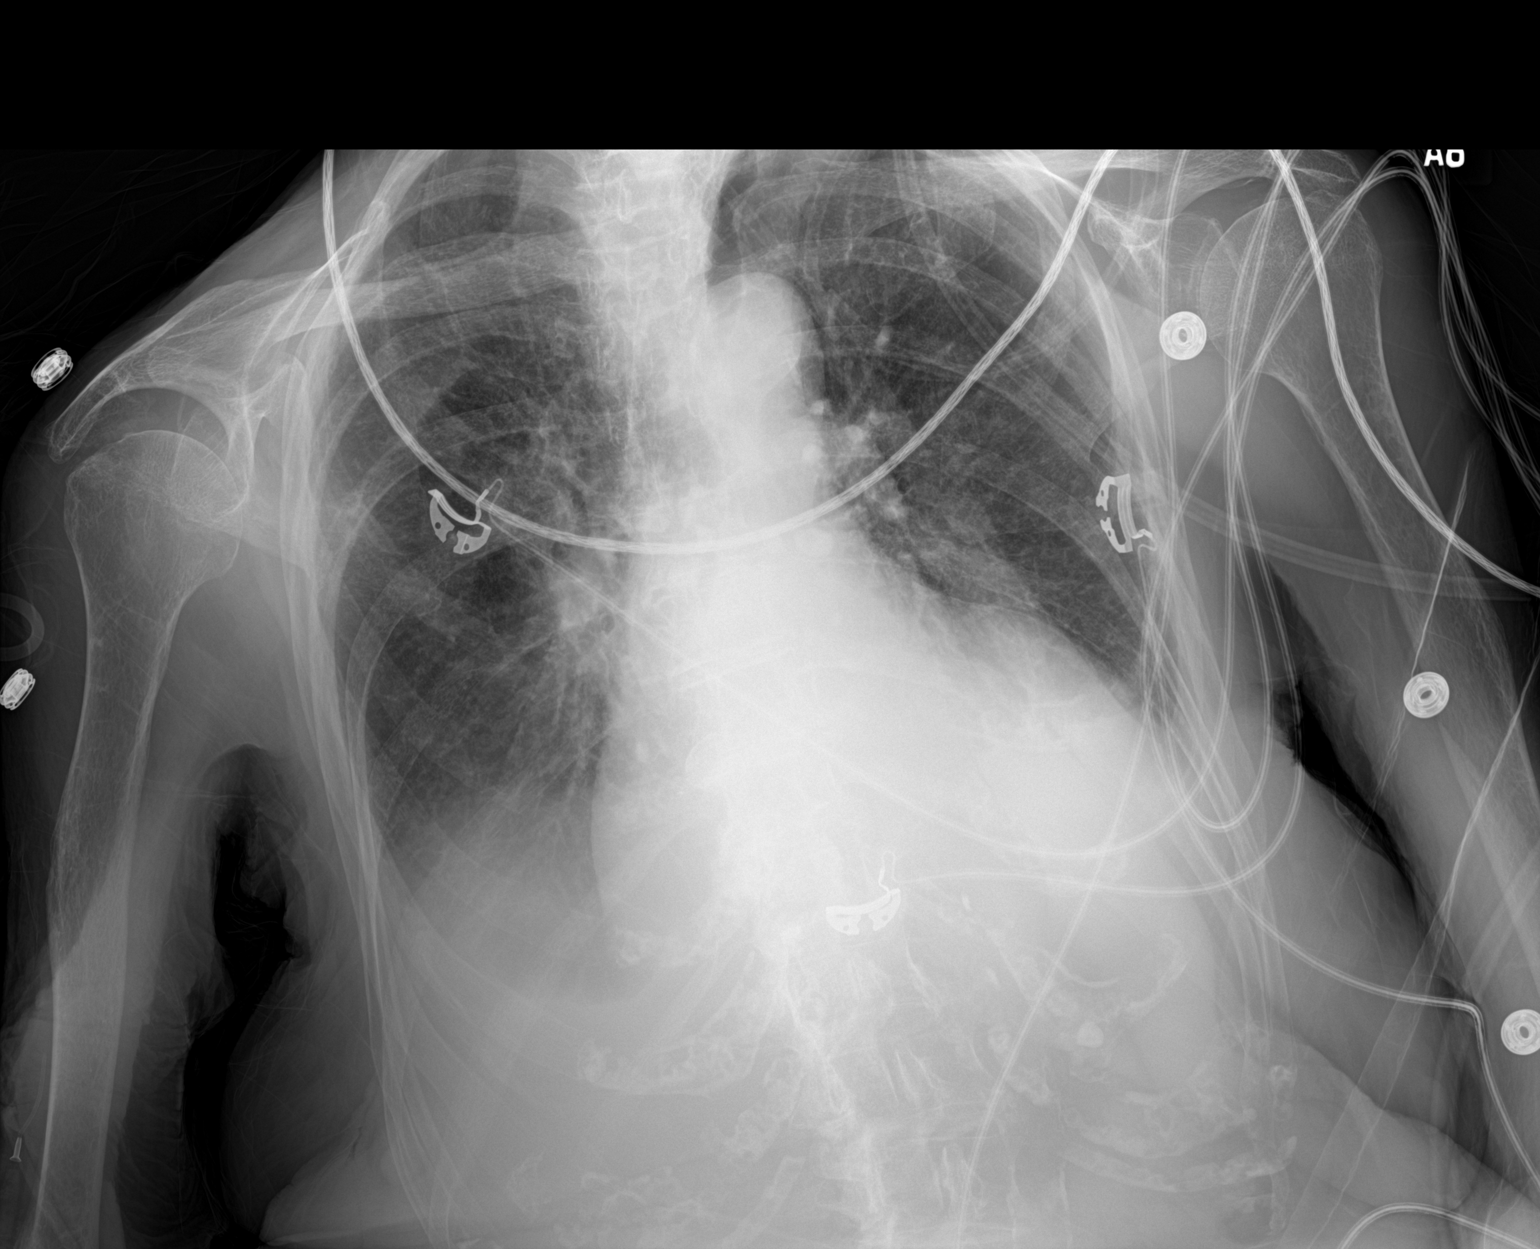

[chest ap (2 of 2)]
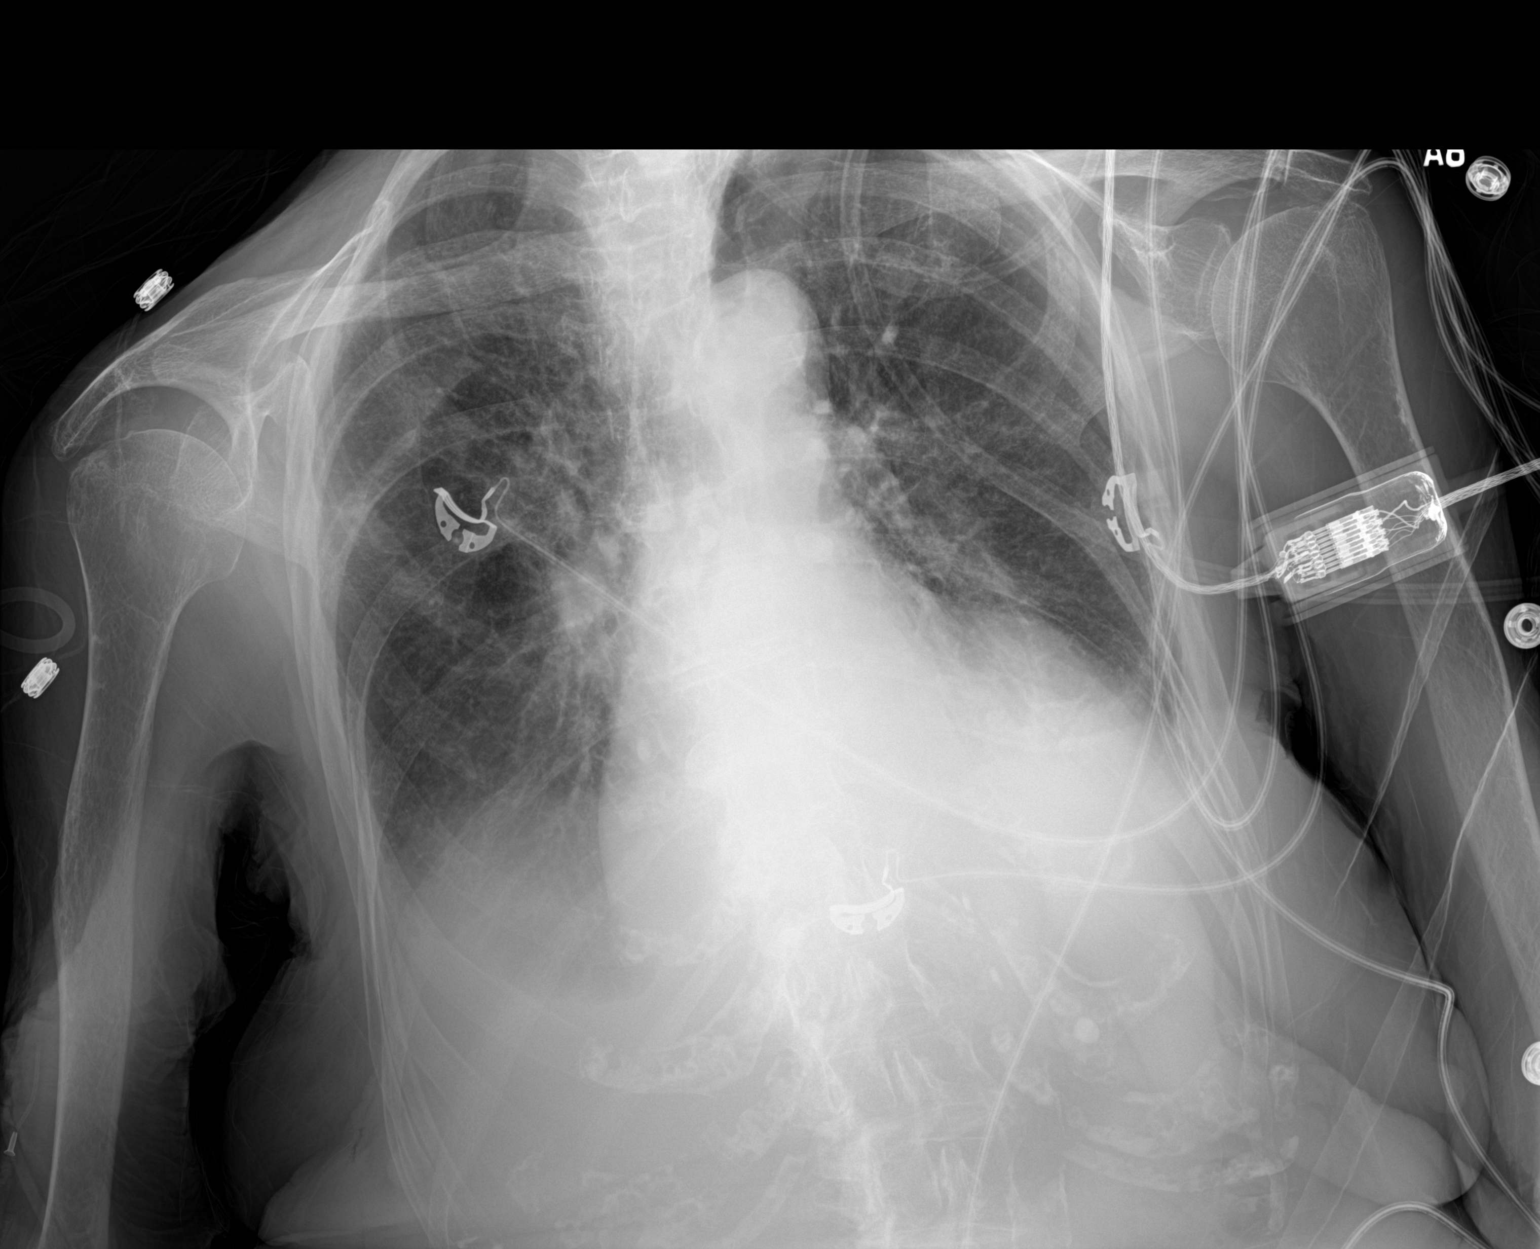

[2 of 2 positions shown; findings below may reference images not displayed]

FINDINGS: Patient is moderately rotated to the left. Lungs are adequately
inflated and demonstrate minimal hazy bibasilar density likely small
effusions without significant change. Interval improvement in mild
opacification of the right perihilar region. Cardiomediastinal
silhouette and remainder of the exam is unchanged.
IMPRESSION: Improving right perihilar airspace process.

Subtle hazy bibasilar density likely small effusions without
significant change.

## 2019-09-28 DEATH — deceased
